# Patient Record
Sex: Female | Born: 1980
Health system: Southern US, Community
[De-identification: ages and names within clinical notes are randomized; demographics above are authoritative.]

## PROBLEM LIST (undated history)

## (undated) DIAGNOSIS — G43019 Migraine without aura, intractable, without status migrainosus: Secondary | ICD-10-CM

## (undated) DIAGNOSIS — E282 Polycystic ovarian syndrome: Secondary | ICD-10-CM

## (undated) DIAGNOSIS — J45909 Unspecified asthma, uncomplicated: Secondary | ICD-10-CM

## (undated) DIAGNOSIS — G932 Benign intracranial hypertension: Secondary | ICD-10-CM

## (undated) DIAGNOSIS — I10 Essential (primary) hypertension: Secondary | ICD-10-CM

## (undated) DIAGNOSIS — C801 Malignant (primary) neoplasm, unspecified: Secondary | ICD-10-CM

## (undated) DIAGNOSIS — R569 Unspecified convulsions: Secondary | ICD-10-CM

## (undated) DIAGNOSIS — G43909 Migraine, unspecified, not intractable, without status migrainosus: Secondary | ICD-10-CM

## (undated) HISTORY — PX: HERNIA REPAIR: SHX51

## (undated) HISTORY — PX: NASAL RECONSTRUCTION: SHX2069

## (undated) HISTORY — DX: Migraine without aura, intractable, without status migrainosus: G43.019

---
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
## 2003-10-20 ENCOUNTER — Ambulatory Visit: Payer: Self-pay | Admitting: Obstetrics and Gynecology

## 2003-12-09 ENCOUNTER — Emergency Department (HOSPITAL_COMMUNITY): Admission: EM | Admit: 2003-12-09 | Discharge: 2003-12-09 | Payer: Self-pay | Admitting: Emergency Medicine

## 2004-04-06 ENCOUNTER — Emergency Department (HOSPITAL_COMMUNITY): Admission: EM | Admit: 2004-04-06 | Discharge: 2004-04-06 | Payer: Self-pay | Admitting: Emergency Medicine

## 2004-10-19 ENCOUNTER — Other Ambulatory Visit: Admission: RE | Admit: 2004-10-19 | Discharge: 2004-10-19 | Payer: Self-pay | Admitting: Obstetrics and Gynecology

## 2005-05-30 ENCOUNTER — Emergency Department (HOSPITAL_COMMUNITY): Admission: EM | Admit: 2005-05-30 | Discharge: 2005-05-30 | Payer: Self-pay | Admitting: Emergency Medicine

## 2005-09-07 ENCOUNTER — Emergency Department (HOSPITAL_COMMUNITY): Admission: EM | Admit: 2005-09-07 | Discharge: 2005-09-07 | Payer: Self-pay | Admitting: Family Medicine

## 2005-12-18 ENCOUNTER — Other Ambulatory Visit: Admission: RE | Admit: 2005-12-18 | Discharge: 2005-12-18 | Payer: Self-pay | Admitting: Obstetrics and Gynecology

## 2006-01-01 ENCOUNTER — Emergency Department (HOSPITAL_COMMUNITY): Admission: EM | Admit: 2006-01-01 | Discharge: 2006-01-01 | Payer: Self-pay | Admitting: Emergency Medicine

## 2007-10-23 ENCOUNTER — Encounter (INDEPENDENT_AMBULATORY_CARE_PROVIDER_SITE_OTHER): Payer: Self-pay | Admitting: Obstetrics and Gynecology

## 2007-10-23 ENCOUNTER — Ambulatory Visit (HOSPITAL_COMMUNITY): Admission: RE | Admit: 2007-10-23 | Discharge: 2007-10-24 | Payer: Self-pay | Admitting: Obstetrics and Gynecology

## 2007-12-13 ENCOUNTER — Emergency Department (HOSPITAL_BASED_OUTPATIENT_CLINIC_OR_DEPARTMENT_OTHER): Admission: EM | Admit: 2007-12-13 | Discharge: 2007-12-13 | Payer: Self-pay | Admitting: Emergency Medicine

## 2007-12-21 ENCOUNTER — Emergency Department (HOSPITAL_COMMUNITY): Admission: EM | Admit: 2007-12-21 | Discharge: 2007-12-21 | Payer: Self-pay | Admitting: Emergency Medicine

## 2008-03-19 ENCOUNTER — Emergency Department (HOSPITAL_COMMUNITY): Admission: EM | Admit: 2008-03-19 | Discharge: 2008-03-19 | Payer: Self-pay | Admitting: Emergency Medicine

## 2008-06-12 ENCOUNTER — Emergency Department (HOSPITAL_BASED_OUTPATIENT_CLINIC_OR_DEPARTMENT_OTHER): Admission: EM | Admit: 2008-06-12 | Discharge: 2008-06-12 | Payer: Self-pay | Admitting: Emergency Medicine

## 2008-06-12 ENCOUNTER — Ambulatory Visit: Payer: Self-pay | Admitting: Diagnostic Radiology

## 2008-07-10 ENCOUNTER — Emergency Department (HOSPITAL_COMMUNITY): Admission: EM | Admit: 2008-07-10 | Discharge: 2008-07-10 | Payer: Self-pay | Admitting: Emergency Medicine

## 2008-09-29 ENCOUNTER — Emergency Department (HOSPITAL_COMMUNITY): Admission: EM | Admit: 2008-09-29 | Discharge: 2008-09-29 | Payer: Self-pay | Admitting: Emergency Medicine

## 2009-01-27 ENCOUNTER — Ambulatory Visit: Payer: Self-pay | Admitting: Diagnostic Radiology

## 2009-01-27 ENCOUNTER — Emergency Department (HOSPITAL_BASED_OUTPATIENT_CLINIC_OR_DEPARTMENT_OTHER): Admission: EM | Admit: 2009-01-27 | Discharge: 2009-01-27 | Payer: Self-pay | Admitting: Emergency Medicine

## 2009-03-27 ENCOUNTER — Emergency Department (HOSPITAL_BASED_OUTPATIENT_CLINIC_OR_DEPARTMENT_OTHER): Admission: EM | Admit: 2009-03-27 | Discharge: 2009-03-27 | Payer: Self-pay | Admitting: Emergency Medicine

## 2009-04-01 ENCOUNTER — Emergency Department (HOSPITAL_BASED_OUTPATIENT_CLINIC_OR_DEPARTMENT_OTHER): Admission: EM | Admit: 2009-04-01 | Discharge: 2009-04-01 | Payer: Self-pay | Admitting: Emergency Medicine

## 2009-05-07 ENCOUNTER — Emergency Department (HOSPITAL_COMMUNITY): Admission: EM | Admit: 2009-05-07 | Discharge: 2009-05-07 | Payer: Self-pay | Admitting: Family Medicine

## 2009-10-05 ENCOUNTER — Ambulatory Visit: Payer: Self-pay | Admitting: Diagnostic Radiology

## 2009-10-05 ENCOUNTER — Emergency Department (HOSPITAL_BASED_OUTPATIENT_CLINIC_OR_DEPARTMENT_OTHER): Admission: EM | Admit: 2009-10-05 | Discharge: 2009-10-05 | Payer: Self-pay | Admitting: Emergency Medicine

## 2010-02-12 ENCOUNTER — Inpatient Hospital Stay (HOSPITAL_COMMUNITY)
Admission: AD | Admit: 2010-02-12 | Discharge: 2010-02-14 | Payer: Self-pay | Source: Home / Self Care | Attending: Obstetrics | Admitting: Obstetrics

## 2010-02-21 LAB — URINALYSIS, ROUTINE W REFLEX MICROSCOPIC
Bilirubin Urine: NEGATIVE
Ketones, ur: NEGATIVE mg/dL
Leukocytes, UA: NEGATIVE
Nitrite: NEGATIVE
Protein, ur: NEGATIVE mg/dL
Specific Gravity, Urine: 1.015 (ref 1.005–1.030)
Urine Glucose, Fasting: NEGATIVE mg/dL
Urobilinogen, UA: 0.2 mg/dL (ref 0.0–1.0)
pH: 6 (ref 5.0–8.0)

## 2010-02-21 LAB — URINE MICROSCOPIC-ADD ON

## 2010-02-21 LAB — WET PREP, GENITAL: Yeast Wet Prep HPF POC: NONE SEEN

## 2010-02-21 LAB — GC/CHLAMYDIA PROBE AMP, GENITAL
Chlamydia, DNA Probe: NEGATIVE
GC Probe Amp, Genital: NEGATIVE

## 2010-04-22 LAB — CBC
HCT: 37.1 % (ref 36.0–46.0)
Hemoglobin: 13 g/dL (ref 12.0–15.0)
MCH: 32 pg (ref 26.0–34.0)
MCHC: 35 g/dL (ref 30.0–36.0)
MCV: 91.7 fL (ref 78.0–100.0)
Platelets: 264 10*3/uL (ref 150–400)
RBC: 4.05 MIL/uL (ref 3.87–5.11)
RDW: 12.7 % (ref 11.5–15.5)
WBC: 7.6 10*3/uL (ref 4.0–10.5)

## 2010-04-22 LAB — BASIC METABOLIC PANEL
BUN: 15 mg/dL (ref 6–23)
CO2: 26 mEq/L (ref 19–32)
Calcium: 9.1 mg/dL (ref 8.4–10.5)
Chloride: 106 mEq/L (ref 96–112)
Creatinine, Ser: 0.9 mg/dL (ref 0.4–1.2)
GFR calc Af Amer: >60 mL/min (ref >60)
GFR calc non Af Amer: >60 mL/min (ref >60)
Glucose, Bld: 90 mg/dL (ref 70–99)
Potassium: 4.1 mEq/L (ref 3.5–5.1)
Sodium: 139 mEq/L (ref 135–145)

## 2010-04-22 LAB — DIFFERENTIAL
Basophils Absolute: 0 10*3/uL (ref 0.0–0.1)
Basophils Relative: 0 % (ref 0–1)
Eosinophils Absolute: 0.2 10*3/uL (ref 0.0–0.7)
Eosinophils Relative: 2 % (ref 0–5)
Lymphocytes Relative: 22 % (ref 12–46)
Lymphs Abs: 1.7 10*3/uL (ref 0.7–4.0)
Monocytes Absolute: 0.6 10*3/uL (ref 0.1–1.0)
Monocytes Relative: 8 % (ref 3–12)
Neutro Abs: 5.1 10*3/uL (ref 1.7–7.7)
Neutrophils Relative %: 68 % (ref 43–77)

## 2010-04-22 LAB — URINALYSIS, ROUTINE W REFLEX MICROSCOPIC
Bilirubin Urine: NEGATIVE
Glucose, UA: NEGATIVE mg/dL
Hgb urine dipstick: NEGATIVE
Ketones, ur: 15 mg/dL — AB
Nitrite: NEGATIVE
Protein, ur: NEGATIVE mg/dL
Specific Gravity, Urine: 1.031 — ABNORMAL HIGH (ref 1.005–1.030)
Urobilinogen, UA: 1 mg/dL (ref 0.0–1.0)
pH: 7 (ref 5.0–8.0)

## 2010-04-22 LAB — RPR: RPR Ser Ql: NONREACTIVE

## 2010-04-22 LAB — HCG, QUANTITATIVE, PREGNANCY: hCG, Beta Chain, Quant, S: 13317 m[IU]/mL — ABNORMAL HIGH (ref <5)

## 2010-04-22 LAB — ABO/RH: ABO/RH(D): B POS

## 2010-04-22 LAB — PREGNANCY, URINE: Preg Test, Ur: POSITIVE

## 2010-04-22 LAB — GC/CHLAMYDIA PROBE AMP, GENITAL: Chlamydia, DNA Probe: NEGATIVE

## 2010-04-22 LAB — WET PREP, GENITAL

## 2010-04-27 LAB — DIFFERENTIAL
Lymphs Abs: 1.2 10*3/uL (ref 0.7–4.0)
Monocytes Relative: 8 % (ref 3–12)
Neutro Abs: 6.3 10*3/uL (ref 1.7–7.7)
Neutrophils Relative %: 75 % (ref 43–77)

## 2010-04-27 LAB — URINALYSIS, ROUTINE W REFLEX MICROSCOPIC
Bilirubin Urine: NEGATIVE
Nitrite: NEGATIVE
Specific Gravity, Urine: 1.025 (ref 1.005–1.030)
Urobilinogen, UA: 1 mg/dL (ref 0.0–1.0)

## 2010-04-27 LAB — GC/CHLAMYDIA PROBE AMP, GENITAL: Chlamydia, DNA Probe: NEGATIVE

## 2010-04-27 LAB — WET PREP, GENITAL: WBC, Wet Prep HPF POC: NONE SEEN

## 2010-04-27 LAB — CBC
MCV: 91.2 fL (ref 78.0–100.0)
RBC: 4.35 MIL/uL (ref 3.87–5.11)
WBC: 8.4 10*3/uL (ref 4.0–10.5)

## 2010-04-27 LAB — PREGNANCY, URINE: Preg Test, Ur: NEGATIVE

## 2010-05-09 LAB — CBC
MCHC: 34.2 g/dL (ref 30.0–36.0)
RBC: 4.26 MIL/uL (ref 3.87–5.11)
WBC: 4.3 10*3/uL (ref 4.0–10.5)

## 2010-05-09 LAB — DIFFERENTIAL
Basophils Relative: 1 % (ref 0–1)
Lymphs Abs: 1.3 10*3/uL (ref 0.7–4.0)
Monocytes Relative: 11 % (ref 3–12)
Neutro Abs: 2.2 10*3/uL (ref 1.7–7.7)
Neutrophils Relative %: 53 % (ref 43–77)

## 2010-05-09 LAB — BASIC METABOLIC PANEL
Calcium: 9.2 mg/dL (ref 8.4–10.5)
Creatinine, Ser: 0.9 mg/dL (ref 0.4–1.2)
GFR calc Af Amer: >60 mL/min (ref >60)

## 2010-05-09 LAB — GC/CHLAMYDIA PROBE AMP, GENITAL
Chlamydia, DNA Probe: NEGATIVE
GC Probe Amp, Genital: NEGATIVE

## 2010-05-09 LAB — URINALYSIS, ROUTINE W REFLEX MICROSCOPIC
Leukocytes, UA: NEGATIVE
Nitrite: NEGATIVE
Specific Gravity, Urine: 1.026 (ref 1.005–1.030)
pH: 7 (ref 5.0–8.0)

## 2010-05-09 LAB — WET PREP, GENITAL

## 2010-05-09 LAB — RPR: RPR Ser Ql: NONREACTIVE

## 2010-05-09 LAB — PREGNANCY, URINE: Preg Test, Ur: NEGATIVE

## 2010-05-09 LAB — URINE MICROSCOPIC-ADD ON

## 2010-05-11 ENCOUNTER — Inpatient Hospital Stay (HOSPITAL_COMMUNITY)
Admission: AD | Admit: 2010-05-11 | Discharge: 2010-05-11 | Disposition: A | Discharge status: Home / Self Care | Payer: Medicaid Other | Source: Ambulatory Visit | Attending: Obstetrics | Admitting: Obstetrics

## 2010-05-11 DIAGNOSIS — W010XXA Fall on same level from slipping, tripping and stumbling without subsequent striking against object, initial encounter: Secondary | ICD-10-CM | POA: Insufficient documentation

## 2010-05-11 DIAGNOSIS — O99891 Other specified diseases and conditions complicating pregnancy: Secondary | ICD-10-CM

## 2010-05-11 DIAGNOSIS — O9989 Other specified diseases and conditions complicating pregnancy, childbirth and the puerperium: Secondary | ICD-10-CM

## 2010-05-14 LAB — DIFFERENTIAL
Basophils Absolute: 0.1 10*3/uL (ref 0.0–0.1)
Basophils Relative: 1 % (ref 0–1)
Eosinophils Absolute: 0.1 10*3/uL (ref 0.0–0.7)
Eosinophils Relative: 2 % (ref 0–5)
Monocytes Absolute: 0.3 10*3/uL (ref 0.1–1.0)

## 2010-05-14 LAB — BASIC METABOLIC PANEL
BUN: 14 mg/dL (ref 6–23)
CO2: 28 mEq/L (ref 19–32)
Chloride: 108 mEq/L (ref 96–112)
GFR calc non Af Amer: >60 mL/min (ref >60)
Glucose, Bld: 99 mg/dL (ref 70–99)
Potassium: 4.2 mEq/L (ref 3.5–5.1)

## 2010-05-14 LAB — CBC
HCT: 36.8 % (ref 36.0–46.0)
MCHC: 33.6 g/dL (ref 30.0–36.0)
MCV: 91.6 fL (ref 78.0–100.0)
Platelets: 245 10*3/uL (ref 150–400)
RDW: 13.6 % (ref 11.5–15.5)

## 2010-05-14 LAB — D-DIMER, QUANTITATIVE: D-Dimer, Quant: 0.29 ug/mL-FEU (ref 0.00–0.48)

## 2010-05-17 LAB — URINE MICROSCOPIC-ADD ON

## 2010-05-17 LAB — COMPREHENSIVE METABOLIC PANEL
ALT: <6 U/L (ref 0–35)
AST: 20 U/L (ref 0–37)
Albumin: 4.5 g/dL (ref 3.5–5.2)
Alkaline Phosphatase: 59 U/L (ref 39–117)
Calcium: 9.4 mg/dL (ref 8.4–10.5)
GFR calc Af Amer: >60 mL/min (ref >60)
Glucose, Bld: 80 mg/dL (ref 70–99)
Potassium: 4.1 mEq/L (ref 3.5–5.1)
Sodium: 146 mEq/L — ABNORMAL HIGH (ref 135–145)
Total Protein: 8 g/dL (ref 6.0–8.3)

## 2010-05-17 LAB — DIFFERENTIAL
Basophils Relative: 1 % (ref 0–1)
Eosinophils Absolute: 0.1 10*3/uL (ref 0.0–0.7)
Lymphs Abs: 1.4 10*3/uL (ref 0.7–4.0)
Monocytes Absolute: 0.2 10*3/uL (ref 0.1–1.0)
Monocytes Relative: 5 % (ref 3–12)
Neutrophils Relative %: 63 % (ref 43–77)

## 2010-05-17 LAB — URINALYSIS, ROUTINE W REFLEX MICROSCOPIC
Glucose, UA: NEGATIVE mg/dL
Ketones, ur: NEGATIVE mg/dL
Leukocytes, UA: NEGATIVE
Nitrite: NEGATIVE
Specific Gravity, Urine: 1.024 (ref 1.005–1.030)
pH: 6 (ref 5.0–8.0)

## 2010-05-17 LAB — CBC
Hemoglobin: 14.5 g/dL (ref 12.0–15.0)
MCHC: 31.9 g/dL (ref 30.0–36.0)
Platelets: 253 10*3/uL (ref 150–400)
RDW: 13 % (ref 11.5–15.5)

## 2010-05-17 LAB — PREGNANCY, URINE: Preg Test, Ur: NEGATIVE

## 2010-05-20 ENCOUNTER — Inpatient Hospital Stay (HOSPITAL_COMMUNITY)
Admission: AD | Admit: 2010-05-20 | Discharge: 2010-05-20 | Disposition: A | Discharge status: Home / Self Care | Payer: Medicaid Other | Source: Ambulatory Visit | Attending: Obstetrics | Admitting: Obstetrics

## 2010-05-20 DIAGNOSIS — H60399 Other infective otitis externa, unspecified ear: Secondary | ICD-10-CM | POA: Insufficient documentation

## 2010-05-20 DIAGNOSIS — O99891 Other specified diseases and conditions complicating pregnancy: Secondary | ICD-10-CM | POA: Insufficient documentation

## 2010-05-20 DIAGNOSIS — A5901 Trichomonal vulvovaginitis: Secondary | ICD-10-CM

## 2010-05-20 DIAGNOSIS — O98819 Other maternal infectious and parasitic diseases complicating pregnancy, unspecified trimester: Secondary | ICD-10-CM

## 2010-05-20 DIAGNOSIS — O9989 Other specified diseases and conditions complicating pregnancy, childbirth and the puerperium: Secondary | ICD-10-CM

## 2010-05-20 LAB — URINE MICROSCOPIC-ADD ON

## 2010-05-20 LAB — URINALYSIS, ROUTINE W REFLEX MICROSCOPIC
Glucose, UA: NEGATIVE mg/dL
Hgb urine dipstick: NEGATIVE
Ketones, ur: NEGATIVE mg/dL
pH: 7.5 (ref 5.0–8.0)

## 2010-05-27 ENCOUNTER — Ambulatory Visit (HOSPITAL_COMMUNITY)
Admission: RE | Admit: 2010-05-27 | Discharge: 2010-05-27 | Disposition: A | Discharge status: Home / Self Care | Payer: Medicaid Other | Source: Ambulatory Visit | Attending: Obstetrics & Gynecology | Admitting: Obstetrics & Gynecology

## 2010-05-27 ENCOUNTER — Inpatient Hospital Stay (HOSPITAL_COMMUNITY)
Admission: AD | Admit: 2010-05-27 | Discharge: 2010-06-01 | DRG: 766 | Disposition: A | Discharge status: Home / Self Care | Payer: Medicaid Other | Source: Ambulatory Visit | Attending: Obstetrics | Admitting: Obstetrics

## 2010-05-27 DIAGNOSIS — O324XX0 Maternal care for high head at term, not applicable or unspecified: Secondary | ICD-10-CM | POA: Diagnosis present

## 2010-05-27 DIAGNOSIS — O139 Gestational [pregnancy-induced] hypertension without significant proteinuria, unspecified trimester: Principal | ICD-10-CM | POA: Diagnosis present

## 2010-05-27 LAB — CBC
HCT: 30.3 % — ABNORMAL LOW (ref 36.0–46.0)
MCH: 30.5 pg (ref 26.0–34.0)
MCHC: 33.3 g/dL (ref 30.0–36.0)
MCV: 91.5 fL (ref 78.0–100.0)
Platelets: 258 10*3/uL (ref 150–400)
RDW: 14.7 % (ref 11.5–15.5)

## 2010-05-27 LAB — URINALYSIS, ROUTINE W REFLEX MICROSCOPIC
Bilirubin Urine: NEGATIVE
Ketones, ur: NEGATIVE mg/dL
Nitrite: NEGATIVE
Specific Gravity, Urine: 1.01 (ref 1.005–1.030)
Urobilinogen, UA: 0.2 mg/dL (ref 0.0–1.0)

## 2010-05-27 LAB — COMPREHENSIVE METABOLIC PANEL
Alkaline Phosphatase: 216 U/L — ABNORMAL HIGH (ref 39–117)
BUN: 4 mg/dL — ABNORMAL LOW (ref 6–23)
Calcium: 9 mg/dL (ref 8.4–10.5)
Creatinine, Ser: 0.75 mg/dL (ref 0.4–1.2)
Glucose, Bld: 75 mg/dL (ref 70–99)
Total Protein: 5.9 g/dL — ABNORMAL LOW (ref 6.0–8.3)

## 2010-05-27 LAB — URIC ACID: Uric Acid, Serum: 5.4 mg/dL (ref 2.4–7.0)

## 2010-05-28 LAB — RPR: RPR Ser Ql: NONREACTIVE

## 2010-05-29 ENCOUNTER — Other Ambulatory Visit: Payer: Self-pay | Admitting: Obstetrics & Gynecology

## 2010-05-29 LAB — MRSA PCR SCREENING: MRSA by PCR: NEGATIVE

## 2010-05-29 LAB — CBC
Hemoglobin: 10.4 g/dL — ABNORMAL LOW (ref 12.0–15.0)
MCHC: 32.8 g/dL (ref 30.0–36.0)
RBC: 3.44 MIL/uL — ABNORMAL LOW (ref 3.87–5.11)
WBC: 12.3 10*3/uL — ABNORMAL HIGH (ref 4.0–10.5)

## 2010-05-29 LAB — MAGNESIUM: Magnesium: 4.5 mg/dL — ABNORMAL HIGH (ref 1.5–2.5)

## 2010-05-30 LAB — CBC
Hemoglobin: 9.3 g/dL — ABNORMAL LOW (ref 12.0–15.0)
MCHC: 33.5 g/dL (ref 30.0–36.0)
WBC: 19.1 10*3/uL — ABNORMAL HIGH (ref 4.0–10.5)

## 2010-05-30 LAB — COMPREHENSIVE METABOLIC PANEL
ALT: 18 U/L (ref 0–35)
AST: 27 U/L (ref 0–37)
CO2: 26 mEq/L (ref 19–32)
Calcium: 8.2 mg/dL — ABNORMAL LOW (ref 8.4–10.5)
GFR calc Af Amer: >60 mL/min (ref >60)
GFR calc non Af Amer: >60 mL/min (ref >60)
Sodium: 136 mEq/L (ref 135–145)
Total Protein: 4.8 g/dL — ABNORMAL LOW (ref 6.0–8.3)

## 2010-06-01 ENCOUNTER — Inpatient Hospital Stay (HOSPITAL_COMMUNITY): Payer: Medicaid Other

## 2010-06-07 NOTE — H&P (Signed)
  NAMEROSEANNE, JUENGER              ACCOUNT NO.:  [id number]  MEDICAL RECORD NO.:  [id number]         PATIENT TYPE:  WINP  LOCATION:  371                           [facility]ACILITY:  [facility]  PHYSICIAN:  Roseanna Rainbow, M.D.DATE OF BIRTH:  May 29, 1980  DATE OF ADMISSION:  05/28/2010 DATE OF DISCHARGE:                             HISTORY & PHYSICAL   CHIEF COMPLAINT:  The patient is a 30 year old para 0 with an intrauterine pregnancy at 39 plus weeks, complaining of a headache.  HISTORY OF PRESENT ILLNESS:  The patient carries a diagnosis of migraine headaches and was on a Triptans medication prior to pregnancy.  She gives a several-day history of a right-sided frontal headache that has been refractory to over-the-counter analgesics.  She did have associated scotomata.  She denies any other neurological symptoms.  This pregnancy has essentially been uncomplicated to date.  In the Maternity Admissions Unit, the patient was treated with Phenergan.  SOCIAL HISTORY:  She is single.  She denies any tobacco, ethanol, or drug use.  ALLERGIES:  To PENICILLIN and NONSTEROIDAL.  PAST GYN HISTORY:  Normal triad.  PAST MEDICAL HISTORY:  Please see the above.  There is a remote history of seizure disorder.  PAST SURGICAL HISTORY:  Herniorrhaphy and surgery to repair deviated nasal septum.  FAMILY HISTORY:  Noncontributory.  PRENATAL CARE:  Prenatal care with Dr. Gaynell Face, onset of care at 12 weeks.  PRENATAL LABS:  Hematocrit 37, hemoglobin 12.1, platelets 259,000. Blood type B positive, antibody screen negative, sickle cell negative, RPR nonreactive, rubella immune, hepatitis B surface antigen negative, HIV nonreactive.  Pap test negative.  GC and Chlamydia probe negative.  MEDICATIONS:  Please see the medication reconciliation form.  PHYSICAL EXAMINATION:  Blood pressures 110s-140s/60s-80s.  Fetal heart tracing baseline 140, moderate long-term variability.  Tocodynamometer, rare  uterine contractions.  Sterile vaginal exam per the RN, cervix is 1- cm dilated, 70% effaced.  LABS:  Hemoglobin 10.1, platelets 268,000.  Uric acid 5.4, creatinine 0.75.  OTs and PTs normal.  Urinalysis, negative protein.  ASSESSMENT:  Nullipara at term with pregnancy-induced hypertension, questionable neurological symptoms with severe headache that is now abating status post magnesium sulfate, category 1 fetal heart tracing, borderline favorable Bishop score.  PLAN:  Admission, induction of labor.  We will start with cervical ripening with Cervidil, magnesium sulfate for seizure prophylaxis.     Roseanna Rainbow, M.D.     Judee Clara  D:  05/28/2010  T:  05/28/2010  Job:  [id number]  Electronically Signed by Antionette Char M.D. on 06/07/2010 03:37:31 PM

## 2010-06-07 NOTE — Op Note (Signed)
[name], [name]              ACCOUNT NO.:  [id number]  MEDICAL RECORD NO.:  [id number]           PATIENT TYPE:  LOCATION:  371                            FACILITY:  PHYSICIAN:  Roseanna Rainbow, M.D.DATE OF BIRTH:  21-Apr-1980  DATE OF PROCEDURE:  05/29/2010 DATE OF DISCHARGE:                              OPERATIVE REPORT   PREOPERATIVE DIAGNOSES:  Intrauterine pregnancy at 39 weeks, severe pregnancy-induced hypertension, arrest of descent.  POSTOPERATIVE DIAGNOSES:  Intrauterine pregnancy at 39 weeks, severe pregnancy-induced hypertension, arrest of descent, persistent occiput posterior.  PROCEDURE:  Primary low uterine flap elliptical cesarean delivery via transverse incision.  SURGEON:  Roseanna Rainbow, MD  ANESTHESIA:  Epidural.  FINDINGS:  Live born female occiput posterior, Apgars 9 at 1 and 5 minutes respectively, umbilical vein pH 7.29, weight 6 pounds 12 ounces.  PATHOLOGY:  Placenta.  ESTIMATED BLOOD LOSS:  600 mL.  COMPLICATIONS:  Right lateral uterine incision extension.  URINE OUTPUT:  As per anesthesiology blood tinged.  IV FLUIDS:  As per Anesthesiology.  PROCEDURE:  The patient was taken to the operating room with an IV running and an epidural catheter in situ.  She was then placed in the dorsal supine position with a lateral tilt and prepped and draped in the usual sterile fashion.  After time-out had been completed a transverse incision was made with the scalpel.  This was extended down to the underlying fascia with the Bovie.  The fascia was nicked in the midline. The fascial incision was then extended bilaterally with curved Mayo scissors.  The superior aspect of the fascial incision was tented up and the underlying rectus muscles dissected off.  The inferior aspect of the fascial incision was manipulated in a similar fashion.  The rectus muscles were separated in the midline.  The parietal peritoneum was tented up and entered  sharply.  This incision was then extended superiorly and inferiorly with good visualization of the bladder.  The bladder blade was then placed.  The vesicouterine peritoneum was entered.  This incision was then extended bilaterally and a bladder flap created using blunt dissection.  The bladder blade was then replaced. The lower uterine segment was incised in a transverse fashion with a scalpel.  The incision was then extended bluntly.  The shoulders were then elevated from above.  There was assistance from below to elevate the infant's head.  The infant's head was wedged in the pelvis.  After several attempts, the suction was broken, and the head was delivered atraumatically.  The cord was clamped and cut.  The infant was handed off to the awaiting neonatologist.  An umbilical vein pH was sent.  The placenta was then removed and the intrauterine cavity was evacuated of any amniotic fluid clots and debris with a moistened laparotomy sponge. The above-noted right sided extension extending down towards the cervix for approximately 2 cm was repaired in a running interlocking fashion using 0 Monocryl.  A 2-cm extension was imbricated.  The remainder of the uterine incision was reapproximated in a running interlocking fashion using a suture of 0 Monocryl.  A second imbricating layer of the same suture  was then placed.  Adequate hemostasis was noted.  The paracolic gutters were then irrigated.  The parietal peritoneum was reapproximated in a running fashion using 2-0 Vicryl.  The fascia was closed with 2 running sutures of 0 Vicryl tied in the midline.  The skin was closed with staples.  At the close of the procedure, the instrument and pack counts were said to be correct x2.  The patient was taken to the PACU awake and in stable condition.     Roseanna Rainbow, M.D.     [name]  D:  05/29/2010  T:  05/29/2010  Job:  [id number]  Electronically Signed by Antionette Char M.D. on  06/07/2010 03:37:38 PM

## 2010-06-08 NOTE — Discharge Summary (Signed)
  NAMEJENIA, KLEPPER              ACCOUNT NO.:  [id number]  MEDICAL RECORD NO.:  [id number]           PATIENT TYPE:  I  LOCATION:  9124                          [facility]:  [facility]  PHYSICIAN:  Kathreen Cosier, M.D.DATE OF BIRTH:  Jun 06, 1980  DATE OF ADMISSION:  05/27/2010 DATE OF DISCHARGE:  06/01/2010                              DISCHARGE SUMMARY   The patient is a 30 year old primigravida, who was admitted at 39 weeks in labor.  The patient underwent a primary low-transverse cesarean section because of failure to progress.  She also had elevated blood pressure and was started on magnesium sulfate.  She had a female, Apgars 9 and 9 weighing 6 pounds 12 ounces.  Postoperatively, on day 1, her highest blood pressure was 147/87 and then 117-134/70.  Her output was good.  The magnesium was discontinued and she was discharged from the ICU.  There too, her hemoglobin was 10.4, highest blood pressure 130/94. She was started on labetalol 200 p.o. q.12 hours.  Third postoperative day, her abdomen was distended.  She had been able to pass gas or had a bowel movement and a flat plate and upright of her abdomen showed an ileus.  Shortly after the x-ray, she started passing gas and had bowel movements after an enema.  She was discharged on the evening of June 01, 2010, ambulatory on a regular diet on Tylox for pain and labetalol 200 mg p.o. b.i.d. to see me in 1 week for blood pressure check.  DISCHARGE DIAGNOSIS:  Status post primary low-transverse cesarean section at term for failure to progress.          ______________________________ Kathreen Cosier, M.D.     BAM/MEDQ  D:  06/01/2010  T:  06/02/2010  Job:  [id number]  Electronically Signed by Francoise Ceo M.D. on 06/08/2010 08:13:45 AM

## 2010-06-21 NOTE — Consult Note (Signed)
[name], [name]              ACCOUNT NO.:  [id number]   MEDICAL RECORD NO.:  [id number]          PATIENT TYPE:  AMB   LOCATION:  SDC                           [facility]  [facility]   PHYSICIAN:  Catherine A. Orlin Hilding, M.D.DATE OF BIRTH:  01/16/1981   DATE OF CONSULTATION:  10/23/2007  DATE OF DISCHARGE:                                 CONSULTATION   CHIEF COMPLAINT:  Seizure, postop from hysteroscopy and polypectomy for  menorrhagia.   HISTORY OF PRESENT ILLNESS:  Patient returned from [facility], hysteroscopy,  and polypectomy showing arching of back, which converted to tonic/clonic  movements per anesthesia and nursing staff, who witnessed this motion.  Patient then was brought to the recovery room, in which the patient  again had more seizure activity.  At that time, I had entered the room,  and the patient was having writhing motions of her lumbar spine with  some tremulous movements of her right arm and left arm along with  shivering activity.  The patient at this time had three seizures with  intermittent periods of time.  Dilantin 1 gm was started and infused  over 45 minutes.  Patient slowly became more arousable, as time  continued.  Patient was noticed to have tremulous motions and jerking  motions when tactile stimulation occurred and only when tactile  stimulation occurs at this time.  Patient soon became arousable to the  point in which she was able to obey commands and express to [place] that she  was cold and her left forearm was in significant pain.  The left arm  being in significant pain was due to the infusion of Dilantin.  Within  20 minutes, the patient was able to speak her name, tell where she was,  move all extremities, and show ocular movement.  While in both postop  and recovery, patient received 2 mg of Versed, for which she was induced  for surgery at 12:30.  Post surgery, she was given 2 mg of Versed, 0.2  mg of pentothal, 5 mg of Valium, 1 gm of Dilantin, Robinul,  and  ciprofloxacin.   PAST MEDICAL HISTORY:  1. Asthma.  2. Heart murmur.  3. Migraine headache.  4. Depression.  5. Anxiety.  6. HSV.  7. Menorrhagia.   HOME MEDICATIONS:  1. Acyclovir.  2. Lo-Estrin.  3. Tylenol.   As stated, prior to surgery, she was given 2 mg of Versed for induction.  While in surgery, Servoflurane and Robinul were used.  Post surgery,  patient was given 2 mg of Versed, 5 mg of Valium, 1 gm of Dilantin to  control seizure activity.   ALLERGIES:  1. ASPIRIN.  2. AMOXICILLIN.  3. MOTRIN.   FAMILY HISTORY:  Patient's mother has a family history of absence  seizures.   SOCIAL HISTORY:  Patient is a young 30 year old female who does not  smoke, drink, or do illicit drugs.   REVIEW OF SYSTEMS:  All 12 review of systems were questioned prior to  surgery.  Patient was positive for heart murmur, asthma, weakness,  menorrhagia, and menstrual cramping pain.   PHYSICAL EXAMINATION:  Patient had a blood pressure of 136/76, pulse 77,  respiratory rate 12.  MENTAL STATUS:  Patient was very sedated in a post-ictal state.  Patient  would open eyes to verbal stimuli.  She would whisper answers  accurately.  She would obey simple commands such as showing two fingers,  showing a thumb on the bilateral hands, showing the left hand and right  hand, moving left leg and right leg.  Pupils were equal, round, and reactive to light.  She had full EOMs.  No  tongue lacerations were noted.  No incontinence was noted.  Patient  vigorously responded to painful stimuli by withdrawal on all four  extremities.  Patient was laying in bed, not moving vigorously or  excessively at the time of exam.  Deep tendon reflexes throughout were  1+ with equivocal toes.  PULMONARY:  Clear to auscultation.  CARDIOVASCULAR:  Regular rate and rhythm.  No murmurs.  NECK:  Negative bruit.  Supple.  SENSATION: As stated, she would pull away from painful stimuli.   LABS:  CMET showed sodium  139, potassium 4, bicarb 108, chloride 27, BUN  12, creatinine 0.9.  Glucose 91.   No imaging was taken at this time.   ASSESSMENT:  A 30 year old status post hysterectomy and polypectomy with  seizure activity, most likely due to anesthetic drugs, such as Robinul,  sevoflurane.  Both Robinul and Servoflurane can cause seizures.  Exam  was nonfocal.  She is to continue with Dilantin bolus.  Hold off on the  maintenance Dilantin at this time.  Check ionized calcium, magnesium.  Check CT scan, possibly  MRI later.  Check EEG.  If further seizure,  consider Topamax, as this would be treatment for migraine and seizure  activity. Lamictal or Keppra are alternative choices.     ______________________________  Felicie Morn, PA-C      Gustavus Messing Orlin Hilding, M.D.  Electronically Signed    DS/MEDQ  D:  10/23/2007  T:  10/24/2007  Job:  [id number]

## 2010-06-21 NOTE — Procedures (Signed)
EEG NUMBER:  [id number].   HISTORY:  This is a 30 year old patient with a history of seizure event  following a procedure.  The patient has history of migraine, is being  evaluated for seizures.   Medications include valium, Zovirax, Dilantin, Tylenol, Zofran, Vicodin.   This is a portable EEG recording.  No skull defects were noted.   EEG CLASSIFICATION:  Normal awake and asleep.   DESCRIPTION OF RECORDING:  Background rhythm of this recording consists  of a fairly well-modulated medium amplitude alpha rhythm of 10 Hz that  is reactive to eye opening and closure.  As the record progresses, the  patient has brief periods during the recording of wakefulness with  bilaterally symmetric alpha rhythm seen.  The vast majority of the  recording is spent in the drowsy and sleeping state.  Vertex sharp wave  activity is noted along with rudimentary sleep spindles, K-complexes.  Photic stimulation and hyperventilation were not performed.  At no time  during the record did there appear to be evidence of spike or spike wave  discharges or evidence of focal slowing.  EKG monitor shows no evidence  of cardiac rhythm abnormalities; heart rate of 66.   IMPRESSION:  This is a normal EEG recording in the waking and sleeping  state.  No evidence of ictal or interictal discharges were seen.      Marlan Palau, M.D.  Electronically Signed     VHQ:IONG  D:  10/24/2007 18:01:38  T:  10/25/2007 02:17:21  Job #:  [id number]

## 2010-06-21 NOTE — Op Note (Signed)
[name], [name]              ACCOUNT NO.:  [id number]   MEDICAL RECORD NO.:  [id number]          PATIENT TYPE:  INP   LOCATION:  9371                          [facility]:  [facility]   PHYSICIAN:  Crist Fat. Rivard, M.D. DATE OF BIRTH:  [birth year]   DATE OF PROCEDURE:  10/23/2007  DATE OF DISCHARGE:                               OPERATIVE REPORT   PREOPERATIVE DIAGNOSIS:  Dysfunctional uterine bleeding with endometrial  polyp.   POSTOPERATIVE DIAGNOSIS:  Dysfunctional uterine bleeding with  endometrial polyp.   ANESTHESIA:  General by Dr. Tacy Dura.   PROCEDURE:  Hysteroscopy, resection of endometrial polyp, dilatation and  curettage.   SURGEON:  Crist Fat. Rivard, MD   ESTIMATED BLOOD LOSS:  Minimal.   WATER DEFICIT:  50 mL.   PROCEDURE:  After being informed of the planned procedure with possible  complications including bleeding, infection, and injury to uterus,  informed consent is obtained.  The patient was taken to OR #7, given  general anesthesia with laryngeal mask airway assistance.  She was  placed in the lithotomy position, prepped and draped in a sterile  fashion and her bladder was emptied with an in-and-out Foley catheter.  Pelvic exam reveals a retroverted uterus, normal in size and shape, and  two normal adnexa.  A weighted speculum was inserted in the vagina and  the anterior lip of the cervix was grasped with a tenaculum forceps.  Uterus was sounded at 7 cm and the cervix was easily dilated using Hegar  dilator until #33, which allows easy entry of an operative hysteroscope.  Using perfusion of sorbitol at a maximum pressure of 80 mmHg, we were  able to visualize the entire uterine cavity with both tubal ostia.  Near  the right tubal ostia at the fundus, a small polyp of 0.5 cm was  identified and using a single loop was resected and sent separately.  The rest of the endometrium appears completely normal.  Operative  hysteroscope was removed and a sharp curette  was used to curette the  endometrial cavity, which removes a moderate amount of normal-appearing  endometrium.  Once the curettage was completed, the hysteroscope was  reinserted for visualization of a normal cavity.  No active bleeding.  Hysteroscope was then removed.  Water deficit was evaluated at 50 mL.  The tenaculum forceps was removed and a laceration on the anterior lip  of the cervix due to the tenaculum forceps was repaired with the running  lock suture of 2-0 Vicryl.  Instrument and sponge count was complete x2.  Estimated blood loss was minimal.   Upon reversing general anesthesia and upon awakening the patient  suddenly presented with myoclonic movement compatible with a grand mal  seizure.  That episode lasted approximately a minute to a minute and 20  seconds and during that period of time maintained a good airway,  maintained good vital signs.  Anesthesia was called back in the room and  upon arrival, the patient had another short episode, which was  characterized as grand mal seizure of a short in duration possibly 30-40  seconds.  At that time,  she received some IV Versed and was transferred  to the PACU.  Upon arrival in PACU, third episode was seen and a  neurological consultation was called to Dr. Orlin Hilding at [facility].  Blood work was ordered with a complete metabolic panel, a  drug screen as well as a prolactin level.  A fourth episode was then  identified and Dr. Tacy Dura ordered some Dilantin to be given IV.  The  patient from PACU was transferred to CT scan for a head CT and will then  be monitored overnight in the Adult Intensive Care Unit under the care  of Dr. Orlin Hilding.   SPECIMEN:  Endometrial polyp and endometrial curettings sent to  pathology.  Operative report dictated by Dr. Estanislado Pandy on October 23, 2007.      Crist Fat Rivard, M.D.  Electronically Signed     SAR/MEDQ  D:  10/23/2007  T:  10/24/2007  Job:  [id number]   cc:   Santina Evans A.  Orlin Hilding, M.D.  Fax: [phone]

## 2010-06-24 NOTE — Discharge Summary (Signed)
[name], [name]              ACCOUNT NO.:  [id number]   MEDICAL RECORD NO.:  [id number]          PATIENT TYPE:  OIB   LOCATION:  9499                          [facility]  [facility]   PHYSICIAN:  Crist Fat. Rivard, M.D. DATE OF BIRTH:  12-Feb-1980   DATE OF ADMISSION:  10/23/2007  DATE OF DISCHARGE:  10/24/2007                               DISCHARGE SUMMARY   DISCHARGE DIAGNOSES:  Dysfunctional uterine bleeding, endometrial polyp,  postoperative seizure activity.   OPERATION:  On the date of admission, the patient underwent a  hysteroscopy, D&C with resection of an endometrial polyp, tolerating the  procedure well.   HISTORY OF PRESENT ILLNESS:  Ms. [name] is a 30 year old single black  female who presents for hysteroscopy, D&C with resection of an  endometrial polyp because of menorrhagia and a known endometrial polyp  based on sonohystogram in June 2009.  In spite of the patient having  been on oral contraceptives, her menorrhagia persisted.  Please see the  patient's history and physical examination for details.   PREOPERATIVE PHYSICAL EXAMINATION:  VITAL SIGNS:  Blood pressure 116/74.  HEENT:  Ear, nose and throat within normal limits.  NECK:  Normal.  CHEST:  Normal.  HEART:  Normal.  ABDOMEN:  Normal.  PELVIC:  Normal.   POSTOPERATIVE COURSE:  On the day of admission, the patient underwent  aforementioned procedure, tolerating it well.  Immediately  postoperatively, the patient experienced seizure activity and received a  neurologic consult.  It was discovered at that time that the patient had  previous seizure activity following anesthesia.  A family history of  seizure activity and it was believed that the patient's symptoms were  related to anesthesia medication.  Because of this, the patient was not  placed on any type of maintenance therapy.  However, she was scheduled  to follow up with a neurologist as an outpatient.  The patient's  comprehensive metabolic panel and  CBC were all within normal limits.  The patient had no further seizure activity during her hospital stay and  by postop day #1 had received the maximum benefit of her hospital visit  and therefore deemed ready for discharge home.   DISCHARGE MEDICATIONS:  The patient was directed to her home medication  reconciliation form.   DISCHARGE INSTRUCTIONS:  The patient was given the [facility] handout on hysteroscopy, D&C.   FOLLOWUP:  The patient is scheduled for a postoperative visit with Dr.  Dois Davenport Rivard in 2 weeks and she is to call [facility] at  [phone] to schedule that appointment.  Diet was without restriction.   FINAL PATHOLOGY:  Endometrium:  Benign proliferative endometrium, no  hyperplasia or malignancy identified.  Endometrium resection:  Fragments  of benign proliferative endometrium and a small amount myometrium.  No  hyperplasia or evidence of malignancy identified.      [place]mira J. Adline Peals.      Crist Fat Rivard, M.D.  Electronically Signed    EJP/MEDQ  D:  11/25/2007  T:  11/26/2007  Job:  [id number]

## 2010-06-24 NOTE — Group Therapy Note (Signed)
NAME:  [name], [name] NO.:  [id number]   MEDICAL RECORD NO.:  [id number]                   PATIENT TYPE:  WOC   LOCATION:  [facility]                           FACILITY:  [facility]   PHYSICIAN:  Argentina Donovan, MD                     DATE OF BIRTH:  1980-12-07   DATE OF SERVICE:  10/20/2003                                    CLINIC NOTE   CHIEF COMPLAINT:  Referred from [facility] for polycystic ovarian  syndrome.   HISTORY OF PRESENT ILLNESS:  This is a 30 year old single G0 who was  diagnosed with polycystic ovarian syndrome in 2002.  No medications  prescribed.  She was seen at [facility] for a routine Pap smear and  referred here for further evaluation and treatment.   PAST MEDICAL HISTORY:  1.  Asthma.  Last asthmatic attack was 3 years ago.  2.  Ruptured ovarian cysts necessitating three hospitalizations.  No      surgeries needed.   MEDICATIONS:  Depo-Provera 150 mg every 3 months.   ALLERGIES:  1.  AMOXICILLIN.  2.  ASPIRIN.  3.  MOTRIN.   SURGICAL HISTORY:  1.  Inguinal hernia repair, bilateral, when the patient 30 years old.  2.  Repair of deviated septum in 2001.   FAMILY HISTORY:  Diabetes in grandparents, heart disease in father,  hypertension in mother and maternal grandmother, leukemia in maternal  grandmother.   MENSTRUAL HISTORY:  Menarche at 30 years old.  Irregular menstrual periods  lasting 5 days with mild dysmenorrhea and intermenstrual bleeding.  Last  normal menstrual period August 30 through October 13, 2003.   OBSTETRICAL HISTORY:  G0.   PERSONAL AND SOCIAL HISTORY:  She is single, lives alone, works at [employer],  and is a Physicist, medical at [employer], studies sports Electrical engineer.  Occasional alcohol and nicotine use.  No history of sexually-  transmitted diseases.   REVIEW OF SYSTEMS:  Occasional fatigue, headache, and muscle aches.   OBJECTIVE:  VITAL SIGNS:  Blood pressure 125/74, pulse  rate is 68,  temperature of 98.7.  GENERAL:  This is a pleasant African-American female, not in distress.  HEENT:  There are features of hirsutism on the chin and the neck.  CARDIOVASCULAR:  Regular rate and rhythm, good S1 and S2, no murmurs.  LUNGS:  Clear to auscultation bilaterally, no crackles.  ABDOMEN:  Positive hirsutism at the lower abdominal area.  No masses or  tenderness.  Normal bowel sounds.  PELVIC:  Deferred.   ASSESSMENT:  Polycystic ovarian syndrome.   PLAN:  1.  Continue Depo-Provera 150 mg IM every 3 months.  2.  Yearly Pap smear.  3.  Follow-up care at [facility]  Argentina Donovan, MD    PR/MEDQ  D:  10/20/2003  T:  10/20/2003  Job:  [phone]

## 2010-11-07 LAB — CBC
HCT: 40
MCV: 90.8
Platelets: 260
RDW: 14.5

## 2010-11-07 LAB — COMPREHENSIVE METABOLIC PANEL
ALT: 12
AST: 18
Albumin: 3.6
CO2: 27
Calcium: 8.7
Creatinine, Ser: 0.9
GFR calc Af Amer: >60
Sodium: 139

## 2010-11-07 LAB — MAGNESIUM: Magnesium: 2

## 2010-11-08 LAB — POCT I-STAT, CHEM 8
BUN: 14
Chloride: 109
HCT: 39
Potassium: 3.4 — ABNORMAL LOW
Sodium: 143

## 2010-11-08 LAB — POCT CARDIAC MARKERS
CKMB, poc: 2.1
Myoglobin, poc: 270
Troponin i, poc: <0.05

## 2010-11-08 LAB — D-DIMER, QUANTITATIVE: D-Dimer, Quant: 0.39

## 2011-03-18 ENCOUNTER — Encounter (HOSPITAL_COMMUNITY): Payer: Self-pay

## 2011-03-18 ENCOUNTER — Emergency Department (HOSPITAL_COMMUNITY)
Admission: EM | Admit: 2011-03-18 | Discharge: 2011-03-18 | Disposition: A | Discharge status: Home / Self Care | Payer: Self-pay | Source: Home / Self Care | Attending: Family Medicine | Admitting: Family Medicine

## 2011-03-18 DIAGNOSIS — IMO0001 Reserved for inherently not codable concepts without codable children: Secondary | ICD-10-CM

## 2011-03-18 DIAGNOSIS — S6390XA Sprain of unspecified part of unspecified wrist and hand, initial encounter: Secondary | ICD-10-CM

## 2011-03-18 MED ORDER — TRAMADOL HCL 50 MG PO TABS: 50.0000 mg | ORAL_TABLET | Freq: Four times a day (QID) | ORAL | Status: AC | PRN

## 2011-03-18 NOTE — ED Notes (Signed)
Pt has had rt hand pain for two weeks, pain is with movement of rt middle finger.

## 2011-03-18 NOTE — ED Provider Notes (Signed)
History     CSN: [id number]  Arrival date & time 03/18/11  0920   First MD Initiated Contact with Patient 03/18/11 1021      Chief Complaint  Patient presents with  . Hand Pain    (Consider location/radiation/quality/duration/timing/severity/associated sxs/prior treatment) Patient is a 31 y.o. female presenting with hand pain. The history is provided by the patient.  Hand Pain This is a new problem. The current episode started more than 1 week ago (2 weeks ago at work but unknown activity, lifting her baby a lot at home, ). The problem has been gradually worsening. Exacerbated by: worse with gripping. She has tried acetaminophen for the symptoms. The treatment provided no relief.    History reviewed. No pertinent past medical history.  Past Surgical History  Procedure Date  . Hernia repair   . Cesarean section     History reviewed. No pertinent family history.  History  Substance Use Topics  . Smoking status: Never Smoker   . Smokeless tobacco: Not on file  . Alcohol Use: Yes    OB History    Grav Para Term Preterm Abortions TAB SAB Ect Mult Living                  Review of Systems  Constitutional: Negative.   Musculoskeletal: Negative for joint swelling.    Allergies  Amoxicillin; Aspirin; and Motrin  Home Medications   Current Outpatient Rx  Name Route Sig Dispense Refill  . PRESCRIPTION MEDICATION  Unknown birth control    . TRAMADOL HCL 50 MG PO TABS Oral Take 1 tablet (50 mg total) by mouth every 6 (six) hours as needed for pain. 15 tablet 0    BP 134/92  Pulse 75  Temp(Src) 98 F (36.7 C) (Oral)  Resp 20  LMP 02/28/2011  Physical Exam  Nursing note and vitals reviewed. Constitutional: She is oriented to person, place, and time. She appears well-developed and well-nourished.  Musculoskeletal: She exhibits tenderness.       Arms: Neurological: She is alert and oriented to person, place, and time.  Skin: Skin is warm and dry.    ED Course    Procedures (including critical care time)  Labs Reviewed - No data to display No results found.   1. Sprain of hand, third finger, right       MDM          Barkley Bruns, MD 03/20/11 1556

## 2011-08-31 ENCOUNTER — Encounter (HOSPITAL_COMMUNITY): Payer: Self-pay | Admitting: *Deleted

## 2011-08-31 ENCOUNTER — Emergency Department (HOSPITAL_COMMUNITY)
Admission: EM | Admit: 2011-08-31 | Discharge: 2011-08-31 | Disposition: A | Discharge status: Home / Self Care | Payer: Self-pay | Attending: Emergency Medicine | Admitting: Emergency Medicine

## 2011-08-31 DIAGNOSIS — R51 Headache: Secondary | ICD-10-CM

## 2011-08-31 HISTORY — DX: Polycystic ovarian syndrome: E28.2

## 2011-08-31 HISTORY — DX: Migraine, unspecified, not intractable, without status migrainosus: G43.909

## 2011-08-31 HISTORY — DX: Unspecified convulsions: R56.9

## 2011-08-31 MED ORDER — SODIUM CHLORIDE 0.9 % IV BOLUS (SEPSIS)
500.0000 mL | Freq: Once | INTRAVENOUS | Status: AC
  Administered 2011-08-31: 500 mL via INTRAVENOUS

## 2011-08-31 MED ORDER — PROMETHAZINE HCL 25 MG/ML IJ SOLN
25.0000 mg | Freq: Once | INTRAMUSCULAR | Status: AC
  Administered 2011-08-31: 25 mg via INTRAVENOUS
  Filled 2011-08-31: qty 1

## 2011-08-31 MED ORDER — DIPHENHYDRAMINE HCL 50 MG/ML IJ SOLN
25.0000 mg | Freq: Once | INTRAMUSCULAR | Status: AC
  Administered 2011-08-31: 25 mg via INTRAVENOUS
  Filled 2011-08-31: qty 1

## 2011-08-31 NOTE — ED Notes (Signed)
Pt with hx of migraines to ED c/o migraine since 2230 last night.  Pt nauseated with emesis x 1 and photophobic.  Last migraine was 05/2008 when she was pregnant.

## 2011-08-31 NOTE — ED Provider Notes (Signed)
History     CSN: [id number]  Arrival date & time 08/31/11  4696   First MD Initiated Contact with Patient 08/31/11 [phone]      Chief Complaint  Patient presents with  . Migraine    (Consider location/radiation/quality/duration/timing/severity/associated sxs/prior treatment) Patient is a 31 y.o. female presenting with migraine. The history is provided by the patient.  Migraine This is a recurrent problem. Associated symptoms include headaches. Pertinent negatives include no chest pain, no abdominal pain and no shortness of breath.   patient has a previous history of migraines. She states that around 10:00 last night she developed her typical headache. It is different in that she has some nauseousness and vomiting. The headache is a throbbing pain. She has photophobia. No fevers. No trauma. No numbness or weakness. Her last headache was  when she was pregnant in 2010.  Past Medical History  Diagnosis Date  . Migraines   . PCOS (polycystic ovarian syndrome)   . Seizures     c/b anesthesia    Past Surgical History  Procedure Date  . Hernia repair   . Cesarean section   . Nasal reconstruction     No family history on file.  History  Substance Use Topics  . Smoking status: Never Smoker   . Smokeless tobacco: Not on file  . Alcohol Use: Yes    OB History    Grav Para Term Preterm Abortions TAB SAB Ect Mult Living                  Review of Systems  Constitutional: Negative for activity change and appetite change.  HENT: Negative for neck stiffness.   Eyes: Positive for photophobia. Negative for pain.  Respiratory: Negative for chest tightness and shortness of breath.   Cardiovascular: Negative for chest pain and leg swelling.  Gastrointestinal: Positive for nausea and vomiting. Negative for abdominal pain and diarrhea.  Genitourinary: Negative for flank pain.  Musculoskeletal: Negative for back pain.  Skin: Negative for rash.  Neurological: Positive for headaches.  Negative for weakness and numbness.  Psychiatric/Behavioral: Negative for behavioral problems.    Allergies  Amoxicillin; Aspirin; and Motrin  Home Medications   Current Outpatient Rx  Name Route Sig Dispense Refill  . PRESCRIPTION MEDICATION  Unknown birth control      BP 144/101  Pulse 84  Temp 98.3 F (36.8 C) (Oral)  Resp 16  Ht 5\' 6"  (1.676 m)  Wt 161 lb (73.029 kg)  BMI 25.99 kg/m2  SpO2 100%  Physical Exam  Constitutional: She is oriented to person, place, and time. She appears well-developed and well-nourished.       Patient is sitting in bed with her head covered by a pillow.  HENT:  Head: Normocephalic.  Eyes: Pupils are equal, round, and reactive to light.  Cardiovascular: Normal rate and regular rhythm.   Pulmonary/Chest: Effort normal and breath sounds normal.  Musculoskeletal: She exhibits no edema and no tenderness.  Neurological: She is alert and oriented to person, place, and time.  Skin: Skin is warm and dry.    ED Course  Procedures (including critical care time)  Labs Reviewed - No data to display No results found.   1. Headache       MDM  Patient with recurrent migraine. Patient denies possibility of pregnancy. Patient feels better after treatment. She is nonfocal exam be discharged home.        Juliet Rude. Rubin Payor, MD 08/31/11 [phone]

## 2011-09-08 ENCOUNTER — Emergency Department (HOSPITAL_COMMUNITY): Payer: No Typology Code available for payment source

## 2011-09-08 ENCOUNTER — Encounter (HOSPITAL_COMMUNITY): Payer: Self-pay | Admitting: Emergency Medicine

## 2011-09-08 ENCOUNTER — Emergency Department (HOSPITAL_COMMUNITY)
Admission: EM | Admit: 2011-09-08 | Discharge: 2011-09-08 | Disposition: A | Discharge status: Home / Self Care | Payer: No Typology Code available for payment source | Attending: Emergency Medicine | Admitting: Emergency Medicine

## 2011-09-08 DIAGNOSIS — R51 Headache: Secondary | ICD-10-CM | POA: Insufficient documentation

## 2011-09-08 DIAGNOSIS — R404 Transient alteration of awareness: Secondary | ICD-10-CM | POA: Insufficient documentation

## 2011-09-08 DIAGNOSIS — M542 Cervicalgia: Secondary | ICD-10-CM

## 2011-09-08 DIAGNOSIS — M545 Low back pain, unspecified: Secondary | ICD-10-CM | POA: Insufficient documentation

## 2011-09-08 MED ORDER — ONDANSETRON HCL 4 MG/2ML IJ SOLN
4.0000 mg | Freq: Four times a day (QID) | INTRAMUSCULAR | Status: DC | PRN
  Administered 2011-09-08: 4 mg via INTRAVENOUS
  Filled 2011-09-08: qty 2

## 2011-09-08 MED ORDER — MORPHINE SULFATE 4 MG/ML IJ SOLN
4.0000 mg | Freq: Once | INTRAMUSCULAR | Status: AC
  Administered 2011-09-08: 4 mg via INTRAVENOUS
  Filled 2011-09-08: qty 1

## 2011-09-08 MED ORDER — SODIUM CHLORIDE 0.9 % IV SOLN
Freq: Once | INTRAVENOUS | Status: AC
  Administered 2011-09-08: 19:00:00 via INTRAVENOUS

## 2011-09-08 MED ORDER — OXYCODONE-ACETAMINOPHEN 5-325 MG PO TABS: ORAL_TABLET | ORAL | Status: AC
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
## 2011-09-08 NOTE — ED Provider Notes (Signed)
Medical screening examination/treatment/procedure(s) were performed by non-physician practitioner and as supervising physician I was immediately available for consultation/collaboration.  Ethelda Chick, MD 09/08/11 2024
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
## 2012-04-25 ENCOUNTER — Encounter (HOSPITAL_COMMUNITY): Payer: Self-pay | Admitting: *Deleted

## 2012-04-25 ENCOUNTER — Emergency Department (HOSPITAL_COMMUNITY)
Admission: EM | Admit: 2012-04-25 | Discharge: 2012-04-25 | Disposition: A | Discharge status: Home / Self Care | Payer: BC Managed Care – PPO | Attending: Emergency Medicine | Admitting: Emergency Medicine

## 2012-04-25 DIAGNOSIS — I10 Essential (primary) hypertension: Secondary | ICD-10-CM | POA: Insufficient documentation

## 2012-04-25 DIAGNOSIS — Z8742 Personal history of other diseases of the female genital tract: Secondary | ICD-10-CM | POA: Insufficient documentation

## 2012-04-25 DIAGNOSIS — Z8669 Personal history of other diseases of the nervous system and sense organs: Secondary | ICD-10-CM | POA: Insufficient documentation

## 2012-04-25 DIAGNOSIS — R51 Headache: Secondary | ICD-10-CM | POA: Insufficient documentation

## 2012-04-25 DIAGNOSIS — Z79899 Other long term (current) drug therapy: Secondary | ICD-10-CM | POA: Insufficient documentation

## 2012-04-25 HISTORY — DX: Essential (primary) hypertension: I10

## 2012-04-25 LAB — POCT PREGNANCY, URINE: Preg Test, Ur: NEGATIVE

## 2012-04-25 MED ORDER — METOCLOPRAMIDE HCL 5 MG/ML IJ SOLN
10.0000 mg | Freq: Once | INTRAMUSCULAR | Status: AC
  Administered 2012-04-25: 10 mg via INTRAVENOUS
  Filled 2012-04-25: qty 2

## 2012-04-25 MED ORDER — SODIUM CHLORIDE 0.9 % IV BOLUS (SEPSIS)
1000.0000 mL | Freq: Once | INTRAVENOUS | Status: AC
  Administered 2012-04-25: 1000 mL via INTRAVENOUS

## 2012-04-25 MED ORDER — DEXAMETHASONE SODIUM PHOSPHATE 10 MG/ML IJ SOLN
10.0000 mg | Freq: Once | INTRAMUSCULAR | Status: AC
  Administered 2012-04-25: 10 mg via INTRAVENOUS
  Filled 2012-04-25: qty 1

## 2012-04-25 NOTE — ED Provider Notes (Signed)
History     CSN: [id number]  Arrival date & time 04/25/12  1327   First MD Initiated Contact with Patient 04/25/12 1752      Chief Complaint  Patient presents with  . Migraine    (Consider location/radiation/quality/duration/timing/severity/associated sxs/prior treatment) Patient is a 32 y.o. female presenting with migraines.  Migraine   Pt with history of migraines reports 3 days of moderate to severe throbbing diffuse headache associated with nausea but no photophobia. This is different from previous migraine headaches she has had. She denies any vomiting or blurry vision, not sudden in onset. Not improved with hydrocodone taken at home.  Past Medical History  Diagnosis Date  . Migraines   . PCOS (polycystic ovarian syndrome)   . Seizures     c/b anesthesia  . Hypertension     Past Surgical History  Procedure Laterality Date  . Hernia repair    . Cesarean section    . Nasal reconstruction      No family history on file.  History  Substance Use Topics  . Smoking status: Never Smoker   . Smokeless tobacco: Not on file  . Alcohol Use: Yes     Comment: occasionally    OB History   Grav Para Term Preterm Abortions TAB SAB Ect Mult Living                  Review of Systems All other systems reviewed and are negative except as noted in HPI.   Allergies  Amoxicillin; Aspirin; and Motrin  Home Medications   Current Outpatient Rx  Name  Route  Sig  Dispense  Refill  . atenolol (TENORMIN) 25 MG tablet   Oral   Take 25 mg by mouth daily.         . hydrochlorothiazide (HYDRODIURIL) 25 MG tablet   Oral   Take 25 mg by mouth every morning.         Lorita Officer Triphasic (ORTHO TRI-CYCLEN, 28, PO)   Oral   Take 1 tablet by mouth daily.           BP 140/75  Pulse 86  Temp(Src) 98.5 F (36.9 C) (Oral)  Resp 18  SpO2 98%  LMP 04/22/2012  Physical Exam  Nursing note and vitals reviewed. Constitutional: She is oriented to person,  place, and time. She appears well-developed and well-nourished.  HENT:  Head: Normocephalic and atraumatic.  Eyes: EOM are normal. Pupils are equal, round, and reactive to light.  Neck: Normal range of motion. Neck supple.  Cardiovascular: Normal rate, normal heart sounds and intact distal pulses.   Pulmonary/Chest: Effort normal and breath sounds normal.  Abdominal: Bowel sounds are normal. She exhibits no distension. There is no tenderness.  Musculoskeletal: Normal range of motion. She exhibits no edema and no tenderness.  Neurological: She is alert and oriented to person, place, and time. She has normal strength. No cranial nerve deficit or sensory deficit.  Skin: Skin is warm and dry. No rash noted.  Psychiatric: She has a normal mood and affect.    ED Course  Procedures (including critical care time)  Labs Reviewed  POCT PREGNANCY, URINE   No results found.   1. Headache       MDM  Pt reports resolution of headache and she is ready go to home. No red flags for severe cause of symptoms. Doubt SAH, mass or meningitis.        Nakiah Osgood B. Bernette Mayers, MD 04/25/12 2019

## 2012-04-25 NOTE — ED Notes (Signed)
Pt states h/a is all throughout head and pain increases if she coughs or sneezes. No neuro deficits.

## 2012-04-25 NOTE — ED Notes (Signed)
Pt to ED c/o headache x 3 days.  She has a hx of migraines, but states this one feels different b/c she is not photophobic, but only nauseated.  Pain is "all throughout" her head.

## 2012-06-24 ENCOUNTER — Other Ambulatory Visit: Payer: Self-pay | Admitting: *Deleted

## 2012-06-24 DIAGNOSIS — R2231 Localized swelling, mass and lump, right upper limb: Secondary | ICD-10-CM

## 2012-06-25 ENCOUNTER — Ambulatory Visit (HOSPITAL_COMMUNITY)
Admission: RE | Admit: 2012-06-25 | Discharge: 2012-06-25 | Disposition: A | Discharge status: Home / Self Care | Payer: Self-pay | Source: Ambulatory Visit | Attending: Obstetrics and Gynecology | Admitting: Obstetrics and Gynecology

## 2012-06-25 ENCOUNTER — Encounter (HOSPITAL_COMMUNITY): Payer: Self-pay

## 2012-06-25 VITALS — BP 138/76 | Temp 98.3°F | Ht 66.5 in | Wt 162.4 lb

## 2012-06-25 DIAGNOSIS — Z01419 Encounter for gynecological examination (general) (routine) without abnormal findings: Secondary | ICD-10-CM

## 2012-06-25 DIAGNOSIS — R2230 Localized swelling, mass and lump, unspecified upper limb: Secondary | ICD-10-CM | POA: Insufficient documentation

## 2012-06-25 NOTE — Progress Notes (Signed)
Complaints of right axillary lump x 1.5 weeks.  Pap Smear:  Completed Pap smear today. Last Pap smear was March 2013 and normal per patient. Per patient has a history of two abnormal Pap smears that required repeat Pap smears. No Pap smear results in EPIC.  Physical exam: Breasts Breasts symmetrical. No skin abnormalities bilateral breasts. No nipple retraction bilateral breasts. No nipple discharge bilateral breasts. No lymphadenopathy. No lumps palpated left breast. Palpated large right axillary lump. No complaints of pain or tenderness on exam. Referred patient to the [facility] for diagnostic mammogram and possible right breast ultrasound. Appointment scheduled for Friday, Jul 05, 2012 at 1040.          Pelvic/Bimanual   Ext Genitalia Small herpes blister observed on right labia, no swelling and no discharge observed on external genitalia. Per patient has a history of genital herpes.    Vagina Vagina pink and normal texture. No lesions or discharge observed in vagina.          Cervix Cervix is present. Cervix pink and of normal texture. Small amount of brownish like discharge from cervical opening.          Uterus Uterus is present and palpable. Uterus in retroverted and normal size.       Adnexae Bilateral ovaries present and palpable. No tenderness on palpation.        Rectovaginal No rectal exam completed today since patient had no rectal complaints. No skin abnormalities observed on rectal area.

## 2012-06-25 NOTE — Patient Instructions (Addendum)
Taught patient how to perform BSE. Let patient know that her next Pap smear will be due in 1 year unless she has an abnormal Pap smear that needs one sooner due to her history of herpes. Referred patient to the [facility] for diagnostic mammogram and possible right breast ultrasound. Appointment scheduled for Friday, Jul 05, 2012 at 1040. Patient aware of appointment and will be there. Let patient know will follow up with her within the next couple weeks with results by phone. Patient verbalized understanding.

## 2012-07-02 ENCOUNTER — Telehealth (HOSPITAL_COMMUNITY): Payer: Self-pay | Admitting: *Deleted

## 2012-07-02 NOTE — Telephone Encounter (Signed)
Telephoned patient at home # and left message to return call to [facility] 

## 2012-07-02 NOTE — Telephone Encounter (Signed)
Patient returned call and advised of normal pap smear results. Next pap due in 3 years. [facility]t for Herpes outbreak. Phone number given to patient. Patient voiced understanding.

## 2012-07-05 ENCOUNTER — Ambulatory Visit
Admission: RE | Admit: 2012-07-05 | Discharge: 2012-07-05 | Disposition: A | Discharge status: Home / Self Care | Payer: No Typology Code available for payment source | Source: Ambulatory Visit | Attending: Obstetrics and Gynecology | Admitting: Obstetrics and Gynecology

## 2012-07-05 DIAGNOSIS — R2231 Localized swelling, mass and lump, right upper limb: Secondary | ICD-10-CM

## 2012-10-30 ENCOUNTER — Other Ambulatory Visit (HOSPITAL_COMMUNITY): Payer: Self-pay | Admitting: Obstetrics

## 2012-10-30 DIAGNOSIS — N83209 Unspecified ovarian cyst, unspecified side: Secondary | ICD-10-CM

## 2012-11-04 ENCOUNTER — Ambulatory Visit (HOSPITAL_COMMUNITY)
Admission: RE | Admit: 2012-11-04 | Discharge: 2012-11-04 | Disposition: A | Discharge status: Home / Self Care | Payer: BC Managed Care – PPO | Source: Ambulatory Visit | Attending: Obstetrics | Admitting: Obstetrics

## 2012-11-04 DIAGNOSIS — N949 Unspecified condition associated with female genital organs and menstrual cycle: Secondary | ICD-10-CM | POA: Insufficient documentation

## 2012-11-04 DIAGNOSIS — N83209 Unspecified ovarian cyst, unspecified side: Secondary | ICD-10-CM

## 2013-01-27 ENCOUNTER — Emergency Department (HOSPITAL_COMMUNITY)
Admission: EM | Admit: 2013-01-27 | Discharge: 2013-01-27 | Disposition: A | Discharge status: Home / Self Care | Payer: BC Managed Care – PPO | Source: Home / Self Care | Attending: Emergency Medicine | Admitting: Emergency Medicine

## 2013-01-27 ENCOUNTER — Encounter (HOSPITAL_COMMUNITY): Payer: Self-pay | Admitting: Emergency Medicine

## 2013-01-27 ENCOUNTER — Emergency Department (INDEPENDENT_AMBULATORY_CARE_PROVIDER_SITE_OTHER): Payer: BC Managed Care – PPO

## 2013-01-27 DIAGNOSIS — J111 Influenza due to unidentified influenza virus with other respiratory manifestations: Secondary | ICD-10-CM

## 2013-01-27 LAB — POCT RAPID STREP A: Streptococcus, Group A Screen (Direct): NEGATIVE

## 2013-01-27 MED ORDER — PREDNISONE 20 MG PO TABS: 20.0000 mg | ORAL_TABLET | Freq: Two times a day (BID) | ORAL | Status: DC

## 2013-01-27 MED ORDER — ACETAMINOPHEN 325 MG PO TABS
ORAL_TABLET | ORAL | Status: AC
  Filled 2013-01-27: qty 2

## 2013-01-27 MED ORDER — HYDROCOD POLST-CHLORPHEN POLST 10-8 MG/5ML PO LQCR: 5.0000 mL | Freq: Two times a day (BID) | ORAL | Status: DC | PRN

## 2013-01-27 MED ORDER — ACETAMINOPHEN 325 MG PO TABS
650.0000 mg | ORAL_TABLET | Freq: Once | ORAL | Status: AC
  Administered 2013-01-27: 650 mg via ORAL

## 2013-01-27 MED ORDER — TRAMADOL HCL 50 MG PO TABS: 100.0000 mg | ORAL_TABLET | Freq: Three times a day (TID) | ORAL | Status: DC | PRN

## 2013-01-27 NOTE — ED Notes (Signed)
Pt  Has  Symptoms  Of a  Non  Productive  Cough     With a  sorethroat  /  Headache    With  The  Symptoms  X  5  Days          She  Reports  Her  Voice  Is  Hoarse      And  The   Symptoms  Are  Not  releived  By otc  meds

## 2013-01-27 NOTE — ED Provider Notes (Signed)
Chief Complaint:   Chief Complaint  Patient presents with  . Cough    History of Present Illness:   [name] is a 32 year old female who has had a five-day history of sore throat, hoarseness, cough productive yellow sputum with blood, chest tightness, chest pain, headache, either of up to 102, chills, sweats, and myalgias. She denies any GI symptoms or nasal congestion. She's had no known sick exposures.  Review of Systems:  Other than noted above, the patient denies any of the following symptoms: Systemic:  No fevers, chills, sweats, weight loss or gain, fatigue, or tiredness. Eye:  No redness or discharge. ENT:  No ear pain, drainage, headache, nasal congestion, drainage, sinus pressure, difficulty swallowing, or sore throat. Neck:  No neck pain or swollen glands. Lungs:  No cough, sputum production, hemoptysis, wheezing, chest tightness, shortness of breath or chest pain. GI:  No abdominal pain, nausea, vomiting or diarrhea.  PMFSH:  Past medical history, family history, social history, meds, and allergies were reviewed. The patient is allergic to amoxicillin, aspirin, and Motrin. She has been taking Tylenol and DayQuil. She has a history of high blood pressure and migraines.  Physical Exam:   Vital signs:  BP 140/79  Pulse 82  Temp(Src) 98.3 F (36.8 C) (Oral)  Resp 16  SpO2 100%  LMP 01/04/2013 General:  Alert and oriented.  In no distress.  Skin warm and dry. Eye:  No conjunctival injection or drainage. Lids were normal. ENT:  TMs and canals were normal, without erythema or inflammation.  Nasal mucosa was clear and uncongested, without drainage.  Mucous membranes were moist.  Pharynx was clear with no exudate or drainage.  There were no oral ulcerations or lesions. Neck:  Supple, no adenopathy, tenderness or mass. Lungs:  No respiratory distress.  Lungs were clear to auscultation, without wheezes, rales or rhonchi.  Breath sounds were clear and equal bilaterally.  Heart:   Regular rhythm, without gallops, murmers or rubs. Skin:  Clear, warm, and dry, without rash or lesions.  Labs:   Results for orders placed during the hospital encounter of 01/27/13  POCT RAPID STREP A ([facility] ONLY)      Result Value Range   Streptococcus, Group A Screen (Direct) NEGATIVE  NEGATIVE     Radiology:  Dg Chest 2 View  01/27/2013   CLINICAL DATA:  Cough, fever.  EXAM: CHEST  2 VIEW  COMPARISON:  September 29, 2008.  FINDINGS: The heart size and mediastinal contours are within normal limits. Both lungs are clear. No pneumothorax or pleural effusion is noted. Mild dextroscoliosis of lower thoracic spine is noted.  IMPRESSION: No acute cardiopulmonary abnormality seen.   Electronically Signed   By: Roque Lias M.D.   On: 01/27/2013 08:55   Course in Urgent Care Center:   She was given Tylenol 650 mg by mouth for the pain.  Assessment:  The encounter diagnosis was Influenza-like illness.  She is outside of the treatment window for Tamiflu. Will need symptomatic treatment.  Plan:   1.  Meds:  The following meds were prescribed:   Discharge Medication List as of 01/27/2013  9:13 AM    START taking these medications   Details  chlorpheniramine-HYDROcodone (TUSSIONEX) 10-8 MG/5ML LQCR Take 5 mLs by mouth every 12 (twelve) hours as needed for cough., Starting 01/27/2013, Until Discontinued, Normal    predniSONE (DELTASONE) 20 MG tablet Take 1 tablet (20 mg total) by mouth 2 (two) times daily., Starting 01/27/2013, Until Discontinued, Normal  traMADol (ULTRAM) 50 MG tablet Take 2 tablets (100 mg total) by mouth every 8 (eight) hours as needed., Starting 01/27/2013, Until Discontinued, Normal        2.  Patient Education/Counseling:  The patient was given appropriate handouts, self care instructions, and instructed in symptomatic relief.  Suggested rest, fluids, and taking infectious precautions.  3.  Follow up:  The patient was told to follow up if no better in 3 to 4 days,  if becoming worse in any way, and given some red flag symptoms such as worsening fever or difficulty breathing which would prompt immediate return.  Follow up here if necessary.      Reuben Likes, MD 01/27/13 2222

## 2013-01-29 LAB — CULTURE, GROUP A STREP

## 2013-12-08 ENCOUNTER — Encounter (HOSPITAL_COMMUNITY): Payer: Self-pay | Admitting: Emergency Medicine

## 2014-01-10 ENCOUNTER — Encounter (HOSPITAL_COMMUNITY): Payer: Self-pay | Admitting: Emergency Medicine

## 2014-01-10 ENCOUNTER — Emergency Department (HOSPITAL_COMMUNITY)
Admission: EM | Admit: 2014-01-10 | Discharge: 2014-01-10 | Disposition: A | Discharge status: Home / Self Care | Payer: BC Managed Care – PPO | Attending: Emergency Medicine | Admitting: Emergency Medicine

## 2014-01-10 ENCOUNTER — Emergency Department (HOSPITAL_COMMUNITY): Payer: BC Managed Care – PPO

## 2014-01-10 ENCOUNTER — Other Ambulatory Visit: Payer: Self-pay

## 2014-01-10 DIAGNOSIS — Z3202 Encounter for pregnancy test, result negative: Secondary | ICD-10-CM | POA: Insufficient documentation

## 2014-01-10 DIAGNOSIS — Z8639 Personal history of other endocrine, nutritional and metabolic disease: Secondary | ICD-10-CM | POA: Insufficient documentation

## 2014-01-10 DIAGNOSIS — I10 Essential (primary) hypertension: Secondary | ICD-10-CM | POA: Diagnosis not present

## 2014-01-10 DIAGNOSIS — Z9889 Other specified postprocedural states: Secondary | ICD-10-CM | POA: Insufficient documentation

## 2014-01-10 DIAGNOSIS — J45901 Unspecified asthma with (acute) exacerbation: Secondary | ICD-10-CM | POA: Diagnosis not present

## 2014-01-10 DIAGNOSIS — R0789 Other chest pain: Secondary | ICD-10-CM | POA: Diagnosis not present

## 2014-01-10 DIAGNOSIS — Z79899 Other long term (current) drug therapy: Secondary | ICD-10-CM | POA: Insufficient documentation

## 2014-01-10 DIAGNOSIS — R0600 Dyspnea, unspecified: Secondary | ICD-10-CM | POA: Diagnosis not present

## 2014-01-10 DIAGNOSIS — G43909 Migraine, unspecified, not intractable, without status migrainosus: Secondary | ICD-10-CM | POA: Insufficient documentation

## 2014-01-10 DIAGNOSIS — Z880 Allergy status to penicillin: Secondary | ICD-10-CM | POA: Diagnosis not present

## 2014-01-10 DIAGNOSIS — R0602 Shortness of breath: Secondary | ICD-10-CM | POA: Diagnosis present

## 2014-01-10 HISTORY — DX: Unspecified asthma, uncomplicated: J45.909

## 2014-01-10 LAB — I-STAT TROPONIN, ED
TROPONIN I, POC: 0 ng/mL (ref 0.00–0.08)
Troponin i, poc: 0 ng/mL (ref 0.00–0.08)

## 2014-01-10 LAB — POC URINE PREG, ED: Preg Test, Ur: NEGATIVE

## 2014-01-10 MED ORDER — DEXAMETHASONE SODIUM PHOSPHATE 10 MG/ML IJ SOLN
6.0000 mg | Freq: Once | INTRAMUSCULAR | Status: AC
  Administered 2014-01-10: 6 mg
  Filled 2014-01-10: qty 1

## 2014-01-10 MED ORDER — ACETAMINOPHEN 325 MG PO TABS
650.0000 mg | ORAL_TABLET | Freq: Once | ORAL | Status: AC
  Administered 2014-01-10: 650 mg via ORAL
  Filled 2014-01-10: qty 2

## 2014-01-10 MED ORDER — IPRATROPIUM-ALBUTEROL 0.5-2.5 (3) MG/3ML IN SOLN
3.0000 mL | Freq: Once | RESPIRATORY_TRACT | Status: AC
  Administered 2014-01-10: 3 mL via RESPIRATORY_TRACT

## 2014-01-10 NOTE — ED Notes (Addendum)
Per EMS. Pt from work, reports chest pain for the past week that has gradually gotten better. Started having sob and dizziness while at work today. When ems arrived pt was tachypneic, but upon arrival pt ambulated to restroom independently without assistence

## 2014-01-10 NOTE — ED Provider Notes (Signed)
CSN: [id number]     Arrival date & time 01/10/14  1037 History   First MD Initiated Contact with Patient 01/10/14 1038     Chief Complaint  Patient presents with  . Shortness of Breath     (Consider location/radiation/quality/duration/timing/severity/associated sxs/prior Treatment) HPI Comments: 33 year old female with history of high blood pressure, asthma nonsmoker presents with lightheadedness, shortness of breath and chest tightness. Patient has had mild symptoms past week have the chest tightness started yesterday without specific timing. No exertional chest pain or diaphoresis. Patient was working in the heat and felt worsening symptoms and lightheaded. No family history of cardiac with her parents. Patient has no cardiac history or blood clot history. Patient denies blood clot history, active cancer, recent major trauma or surgery, unilateral leg swelling/ pain, recent long travel, hemoptysis or oral contraceptives. Worse with a deep breath. .  Patient is a 33 y.o. female presenting with shortness of breath. The history is provided by the patient.  Shortness of Breath Associated symptoms: cough   Associated symptoms: no abdominal pain, no chest pain, no fever, no headaches, no neck pain, no rash and no vomiting     Past Medical History  Diagnosis Date  . Migraines   . PCOS (polycystic ovarian syndrome)   . Seizures     c/b anesthesia  . Hypertension   . Asthma    Past Surgical History  Procedure Laterality Date  . Hernia repair    . Nasal reconstruction    . Cesarean section      one previous   Family History  Problem Relation Age of Onset  . Breast cancer Maternal Aunt   . Diabetes Maternal Grandmother   . Diabetes Paternal Grandfather   . Cancer Maternal Aunt     ovarian   History  Substance Use Topics  . Smoking status: Never Smoker   . Smokeless tobacco: Never Used  . Alcohol Use: Yes     Comment: occasionally   OB History    Gravida Para Term Preterm AB  TAB SAB Ectopic Multiple Living   1 1 1       1      Review of Systems  Constitutional: Negative for fever and chills.  HENT: Negative for congestion.   Eyes: Negative for visual disturbance.  Respiratory: Positive for cough, chest tightness and shortness of breath.   Cardiovascular: Negative for chest pain.  Gastrointestinal: Negative for vomiting and abdominal pain.  Genitourinary: Negative for dysuria and flank pain.  Musculoskeletal: Negative for back pain, neck pain and neck stiffness.  Skin: Negative for rash.  Neurological: Negative for light-headedness and headaches.      Allergies  Amoxicillin; Aspirin; and Motrin  Home Medications   Prior to Admission medications   Medication Sig Start Date End Date Taking? Authorizing Provider  acetaminophen (TYLENOL) 500 MG tablet Take 1,000 mg by mouth every 6 (six) hours as needed for headache.   Yes Historical Provider, MD  atenolol (TENORMIN) 25 MG tablet Take 25 mg by mouth daily.   Yes Historical Provider, MD  cyclobenzaprine (FLEXERIL) 5 MG tablet Take 5 mg by mouth 3 (three) times daily as needed for muscle spasms.  12/14/13  Yes Historical Provider, MD  ranitidine (ZANTAC) 150 MG tablet Take 150 mg by mouth 2 (two) times daily.   Yes Historical Provider, MD  Simethicone (GAS RELIEF PO) Take 1 capsule by mouth every 6 (six) hours as needed (for gas).   Yes Historical Provider, MD  chlorpheniramine-HYDROcodone ([place]) 10-8 MG/5ML  LQCR Take 5 mLs by mouth every 12 (twelve) hours as needed for cough. Patient not taking: Reported on 01/10/2014 01/27/13   Harden Mo, MD  hydrochlorothiazide (HYDRODIURIL) 25 MG tablet Take 25 mg by mouth every morning.    Historical Provider, MD  Norgestim-Eth Estrad Triphasic (ORTHO TRI-CYCLEN, 28, PO) Take 1 tablet by mouth daily.    Historical Provider, MD  predniSONE (DELTASONE) 20 MG tablet Take 1 tablet (20 mg total) by mouth 2 (two) times daily. Patient not taking: Reported on 01/10/2014  01/27/13   Harden Mo, MD  traMADol (ULTRAM) 50 MG tablet Take 2 tablets (100 mg total) by mouth every 8 (eight) hours as needed. Patient not taking: Reported on 01/10/2014 01/27/13   Harden Mo, MD  valACYclovir (VALTREX) 500 MG tablet  12/18/13   Historical Provider, MD   BP 124/78 mmHg  Pulse 70  Temp(Src) 98.3 F (36.8 C) (Oral)  Resp 18  SpO2 100%  LMP 12/11/2013 Physical Exam  Constitutional: She is oriented to person, place, and time. She appears well-developed and well-nourished.  HENT:  Head: Normocephalic and atraumatic.  Eyes: Conjunctivae are normal. Right eye exhibits no discharge. Left eye exhibits no discharge.  Neck: Normal range of motion. Neck supple. No tracheal deviation present.  Cardiovascular: Normal rate and regular rhythm.   Pulmonary/Chest: Effort normal. She has wheezes (mild end exp).  Abdominal: Soft. She exhibits no distension. There is no tenderness. There is no guarding.  Musculoskeletal: She exhibits no edema.  Neurological: She is alert and oriented to person, place, and time.  Skin: Skin is warm. No rash noted.  Psychiatric: She has a normal mood and affect.  Nursing note and vitals reviewed.   ED Course  Procedures (including critical care time) Labs Review Labs Reviewed  Randolm Idol, ED  POC URINE PREG, ED  I-STAT TROPOININ, ED    Imaging Review Dg Chest 2 View (if Patient Has Fever And/or Copd)  01/10/2014   CLINICAL DATA:  Chest pain, shortness of breath  EXAM: CHEST  2 VIEW  COMPARISON:  01/27/2013  FINDINGS: The heart size and mediastinal contours are within normal limits. Both lungs are clear. The visualized skeletal structures are unremarkable. Rightward curvature of the thoracic spine centered at T8 reidentified. Hair artifact overlies the lung apices.  IMPRESSION: No active cardiopulmonary disease.   Electronically Signed   By: Conchita Paris M.D.   On: 01/10/2014 12:12     EKG Interpretation None     EKG reviewed  heart rate 81, normal QT, sinus, nonspecific T-wave inversion 2, aVF, V3 and V4. No acute ST elevation. MDM   Final diagnoses:  Chest tightness  Dyspnea   Patient with history of asthma and high blood pressure presents with worsening chest tightness and shortness of breath for a week. Patient has no blood clot risk factors, PE RC negative, vitals normal except very mild elevated blood pressure. Patient not requiring oxygen. EKG reviewed.  Plan for screening troponin, patient has had symptoms since yesterday, patient very low risk cardiac. Plan for breathing treatment with chest tightness shortness of breath and asthma history. Chest x-ray pending  Chest X reviewed no acute findings. Patient received nebulizer and oral steroid, patient feels improved. Second troponin negative. Patient low risk and discussed outpatient follow-up and reasons to return.  Results and differential diagnosis were discussed with the patient/parent/guardian. Close follow up outpatient was discussed, comfortable with the plan.   Medications  ipratropium-albuterol (DUONEB) 0.5-2.5 (3) MG/3ML nebulizer solution 3  mL (3 mLs Nebulization Given 01/10/14 1145)  acetaminophen (TYLENOL) tablet 650 mg (650 mg Oral Given 01/10/14 1241)  dexamethasone (DECADRON) injection 6 mg (6 mg Other Given 01/10/14 1241)    Filed Vitals:   01/10/14 1130 01/10/14 1145 01/10/14 1353 01/10/14 1427  BP:   124/78   Pulse: 90  70   Temp:  98.1 F (36.7 C)  98.3 F (36.8 C)  TempSrc:  Oral  Oral  Resp: 20  18   SpO2: 96%  100%     Final diagnoses:  Chest tightness  Dyspnea       Mariea Clonts, MD 01/10/14 1527

## 2014-01-10 NOTE — Discharge Instructions (Signed)
If you were given medicines take as directed.  If you are on coumadin or contraceptives realize their levels and effectiveness is altered by many different medicines.  If you have any reaction (rash, tongues swelling, other) to the medicines stop taking and see a physician.   Please follow up as directed and return to the ER or see a physician for new or worsening symptoms.  Thank you. Filed Vitals:   01/10/14 1121 01/10/14 1130 01/10/14 1145 01/10/14 1353  BP: 111/77   124/78  Pulse: 70 90  70  Temp:   98.1 F (36.7 C)   TempSrc:   Oral   Resp: 29 20  18   SpO2: 100% 96%  100%

## 2014-01-10 NOTE — ED Notes (Signed)
Bed: CV89 Expected date: 01/10/14 Expected time: 10:39 AM Means of arrival: Ambulance Comments: [facility] ? Anxiety CP x 1 wk

## 2014-01-10 NOTE — ED Notes (Signed)
Patient transported to X-ray 

## 2015-03-28 ENCOUNTER — Emergency Department (HOSPITAL_COMMUNITY): Payer: BLUE CROSS/BLUE SHIELD

## 2015-03-28 ENCOUNTER — Encounter (HOSPITAL_COMMUNITY): Payer: Self-pay | Admitting: Emergency Medicine

## 2015-03-28 DIAGNOSIS — Z880 Allergy status to penicillin: Secondary | ICD-10-CM | POA: Insufficient documentation

## 2015-03-28 DIAGNOSIS — I10 Essential (primary) hypertension: Secondary | ICD-10-CM | POA: Diagnosis not present

## 2015-03-28 DIAGNOSIS — J45909 Unspecified asthma, uncomplicated: Secondary | ICD-10-CM | POA: Diagnosis not present

## 2015-03-28 DIAGNOSIS — G43909 Migraine, unspecified, not intractable, without status migrainosus: Secondary | ICD-10-CM | POA: Insufficient documentation

## 2015-03-28 DIAGNOSIS — R0981 Nasal congestion: Secondary | ICD-10-CM | POA: Diagnosis present

## 2015-03-28 DIAGNOSIS — J101 Influenza due to other identified influenza virus with other respiratory manifestations: Secondary | ICD-10-CM | POA: Diagnosis not present

## 2015-03-28 DIAGNOSIS — Z8639 Personal history of other endocrine, nutritional and metabolic disease: Secondary | ICD-10-CM | POA: Diagnosis not present

## 2015-03-28 DIAGNOSIS — Z793 Long term (current) use of hormonal contraceptives: Secondary | ICD-10-CM | POA: Diagnosis not present

## 2015-03-28 DIAGNOSIS — Z79899 Other long term (current) drug therapy: Secondary | ICD-10-CM | POA: Diagnosis not present

## 2015-03-28 LAB — URINE MICROSCOPIC-ADD ON: BACTERIA UA: NONE SEEN

## 2015-03-28 LAB — URINALYSIS, ROUTINE W REFLEX MICROSCOPIC
Bilirubin Urine: NEGATIVE
Glucose, UA: NEGATIVE mg/dL
KETONES UR: 15 mg/dL — AB
LEUKOCYTES UA: NEGATIVE
NITRITE: NEGATIVE
PH: 6 (ref 5.0–8.0)
Protein, ur: NEGATIVE mg/dL
Specific Gravity, Urine: 1.03 (ref 1.005–1.030)

## 2015-03-28 MED ORDER — ACETAMINOPHEN 325 MG PO TABS
ORAL_TABLET | ORAL | Status: AC
  Filled 2015-03-28: qty 2

## 2015-03-28 MED ORDER — ACETAMINOPHEN 325 MG PO TABS
650.0000 mg | ORAL_TABLET | Freq: Once | ORAL | Status: AC | PRN
  Administered 2015-03-28: 650 mg via ORAL

## 2015-03-28 NOTE — ED Notes (Signed)
Patient transported to X-ray 

## 2015-03-28 NOTE — ED Notes (Signed)
The patient has been coughing, had generalized malaise, fever and pain.  She has been taking theraflu and it is not working.  She rates her pain 5/10.  She has been coughing up yellow sputum.

## 2015-03-29 ENCOUNTER — Emergency Department (HOSPITAL_COMMUNITY)
Admission: EM | Admit: 2015-03-29 | Discharge: 2015-03-29 | Disposition: A | Discharge status: Home / Self Care | Payer: BLUE CROSS/BLUE SHIELD | Attending: Emergency Medicine | Admitting: Emergency Medicine

## 2015-03-29 DIAGNOSIS — J101 Influenza due to other identified influenza virus with other respiratory manifestations: Secondary | ICD-10-CM

## 2015-03-29 MED ORDER — GUAIFENESIN-CODEINE 100-10 MG/5ML PO SOLN: 10.0000 mL | Freq: Four times a day (QID) | ORAL | Status: DC | PRN

## 2015-03-29 NOTE — ED Provider Notes (Signed)
CSN: [id number]     Arrival date & time 03/28/15  2134 History  By signing my name below, I, Nicole Kindred, attest that this documentation has been prepared under the direction and in the presence of Veryl Speak, MD.   Electronically Signed: Nicole Kindred, ED Scribe. 03/29/2015. 3:49 AM     Chief Complaint  Patient presents with  . URI    The patient has been coughing, had generalized malaise, fever and pain.  She has been taking theraflu and it is not working.     Patient is a 35 y.o. female presenting with URI. The history is provided by the patient. No language interpreter was used.  URI Presenting symptoms: congestion, cough and fever   Severity:  Moderate Onset quality:  Gradual Duration:  1 week Timing:  Constant Relieved by:  Nothing Worsened by:  Nothing tried Ineffective treatments:  None tried Associated symptoms: myalgias    HPI Comments: [name] is a 35 y.o. female who presents to the Emergency Department complaining of gradual onset, constant productive cough with yellow phlegm, onset about a week ago. Pt also reports associated intermittent fever with Tmax 103, congestion, and muscle aches. Pt states positive sick contact with son who was diagnosed with the flu. No other alleviating or worsening factors noted. Pt denies vomiting, diarrhea, abdominal pain, and any other pertinent symptoms.      Past Medical History  Diagnosis Date  . Migraines   . PCOS (polycystic ovarian syndrome)   . Seizures ([place])     c/b anesthesia  . Hypertension   . Asthma    Past Surgical History  Procedure Laterality Date  . Hernia repair    . Nasal reconstruction    . Cesarean section      one previous   Family History  Problem Relation Age of Onset  . Breast cancer Maternal Aunt   . Diabetes Maternal Grandmother   . Diabetes Paternal Grandfather   . Cancer Maternal Aunt     ovarian   Social History  Substance Use Topics  . Smoking status: Never Smoker    . Smokeless tobacco: Never Used  . Alcohol Use: Yes     Comment: occasionally   OB History    Gravida Para Term Preterm AB TAB SAB Ectopic Multiple Living   1 1 1       1      Review of Systems  Constitutional: Positive for fever.  HENT: Positive for congestion.   Respiratory: Positive for cough.   Gastrointestinal: Negative for nausea, vomiting, abdominal pain and diarrhea.  Musculoskeletal: Positive for myalgias.  All other systems reviewed and are negative.     Allergies  Amoxicillin; Aspirin; and Motrin  Home Medications   Prior to Admission medications   Medication Sig Start Date End Date Taking? Authorizing Provider  acetaminophen (TYLENOL) 500 MG tablet Take 1,000 mg by mouth every 6 (six) hours as needed for headache.    Historical Provider, MD  atenolol (TENORMIN) 25 MG tablet Take 25 mg by mouth daily.    Historical Provider, MD  chlorpheniramine-HYDROcodone (TUSSIONEX) 10-8 MG/5ML LQCR Take 5 mLs by mouth every 12 (twelve) hours as needed for cough. Patient not taking: Reported on 01/10/2014 01/27/13   Harden Mo, MD  cyclobenzaprine (FLEXERIL) 5 MG tablet Take 5 mg by mouth 3 (three) times daily as needed for muscle spasms.  12/14/13   Historical Provider, MD  hydrochlorothiazide (HYDRODIURIL) 25 MG tablet Take 25 mg by mouth every morning.  Historical Provider, MD  Norgestim-Eth Estrad Triphasic (ORTHO TRI-CYCLEN, 28, PO) Take 1 tablet by mouth daily.    Historical Provider, MD  predniSONE (DELTASONE) 20 MG tablet Take 1 tablet (20 mg total) by mouth 2 (two) times daily. Patient not taking: Reported on 01/10/2014 01/27/13   Harden Mo, MD  ranitidine (ZANTAC) 150 MG tablet Take 150 mg by mouth 2 (two) times daily.    Historical Provider, MD  Simethicone (GAS RELIEF PO) Take 1 capsule by mouth every 6 (six) hours as needed (for gas).    Historical Provider, MD  traMADol (ULTRAM) 50 MG tablet Take 2 tablets (100 mg total) by mouth every 8 (eight) hours as  needed. Patient not taking: Reported on 01/10/2014 01/27/13   Harden Mo, MD  valACYclovir (VALTREX) 500 MG tablet  12/18/13   Historical Provider, MD   BP 121/79 mmHg  Pulse 111  Temp(Src) 101.2 F (38.4 C) (Oral)  SpO2 99%  LMP 03/28/2015 Physical Exam  Constitutional: She is oriented to person, place, and time. She appears well-developed and well-nourished. No distress.  HENT:  Head: Normocephalic and atraumatic.  Eyes: EOM are normal.  Neck: Normal range of motion.  Cardiovascular: Normal rate, regular rhythm and normal heart sounds.   No murmur heard. Pulmonary/Chest: Effort normal and breath sounds normal. No respiratory distress. She has no wheezes. She has no rales.  Abdominal: Soft. She exhibits no distension. There is no tenderness.  Musculoskeletal: Normal range of motion. She exhibits no edema or tenderness.  Neurological: She is alert and oriented to person, place, and time.  Skin: Skin is warm and dry.  Psychiatric: She has a normal mood and affect. Judgment normal.  Nursing note and vitals reviewed.   ED Course  Procedures (including critical care time) DIAGNOSTIC STUDIES: Oxygen Saturation is 99% on RA, normal by my interpretation.    COORDINATION OF CARE: 3:45 AM-Discussed treatment plan which includes tylenol/motrin for fever control, and rest with pt at bedside and pt agreed to plan.   Labs Review Labs Reviewed  URINALYSIS, ROUTINE W REFLEX MICROSCOPIC (NOT AT [facility]) - Abnormal; Notable for the following:    Hgb urine dipstick SMALL (*)    Ketones, ur 15 (*)    All other components within normal limits  URINE MICROSCOPIC-ADD ON - Abnormal; Notable for the following:    Squamous Epithelial / LPF 0-5 (*)    All other components within normal limits    Imaging Review Dg Chest 2 View  03/28/2015  CLINICAL DATA:  Cough and fever, intermittent for 1 week. Upper respiratory infection. EXAM: CHEST  2 VIEW COMPARISON:  01/10/2014 FINDINGS: The  cardiomediastinal contours are normal. The lungs are clear. Pulmonary vasculature is normal. No consolidation, pleural effusion, or pneumothorax. Scoliotic curvature of the thoracic spine, unchanged from prior. No acute osseous abnormalities are seen. IMPRESSION: No acute pulmonary process. Electronically Signed   By: Jeb Levering M.D.   On: 03/28/2015 23:12   I have personally reviewed and evaluated these images and lab results as part of my medical decision-making.    MDM   Final diagnoses:  None   Patient presents with complaints of fevers, congestion, body aches, and cough. She tells me that her daughter was recently diagnosed with "the flu". The patient's presentation, exam are both consistent with a viral upper respiratory infection, possibly influenza. She will be treated with cough medication, anti-inflammatories and Tylenol, and when necessary return. There is no hypoxia, she is nontoxic appearing, and vital signs  are stable.   I personally performed the services described in this documentation, which was scribed in my presence. The recorded information has been reviewed and is accurate.       Veryl Speak, MD 03/29/15 [phone]

## 2015-03-29 NOTE — Discharge Instructions (Signed)
Tylenol 1000 mg rotated with Motrin 600 mg every 4 hours as needed for pain or fever.  Robitussin with codeine as prescribed as needed for cough.  Return to the emergency department if symptoms significantly worsen or change.   Influenza, Adult Influenza ("the flu") is a viral infection of the respiratory tract. It occurs more often in winter months because people spend more time in close contact with one another. Influenza can make you feel very sick. Influenza easily spreads from person to person (contagious). CAUSES  Influenza is caused by a virus that infects the respiratory tract. You can catch the virus by breathing in droplets from an infected person's cough or sneeze. You can also catch the virus by touching something that was recently contaminated with the virus and then touching your mouth, nose, or eyes. RISKS AND COMPLICATIONS You may be at risk for a more severe case of influenza if you smoke cigarettes, have diabetes, have chronic heart disease (such as heart failure) or lung disease (such as asthma), or if you have a weakened immune system. Elderly people and pregnant women are also at risk for more serious infections. The most common problem of influenza is a lung infection (pneumonia). Sometimes, this problem can require emergency medical care and may be life threatening. SIGNS AND SYMPTOMS  Symptoms typically last 4 to 10 days and may include:  Fever.  Chills.  Headache, body aches, and muscle aches.  Sore throat.  Chest discomfort and cough.  Poor appetite.  Weakness or feeling tired.  Dizziness.  Nausea or vomiting. DIAGNOSIS  Diagnosis of influenza is often made based on your history and a physical exam. A nose or throat swab test can be done to confirm the diagnosis. TREATMENT  In mild cases, influenza goes away on its own. Treatment is directed at relieving symptoms. For more severe cases, your health care provider may prescribe antiviral medicines to  shorten the sickness. Antibiotic medicines are not effective because the infection is caused by a virus, not by bacteria. HOME CARE INSTRUCTIONS  Take medicines only as directed by your health care provider.  Use a cool mist humidifier to make breathing easier.  Get plenty of rest until your temperature returns to normal. This usually takes 3 to 4 days.  Drink enough fluid to keep your urine clear or pale yellow.  Cover yourmouth and nosewhen coughing or sneezing,and wash your handswellto prevent thevirusfrom spreading.  Stay homefromwork orschool untilthe fever is gonefor at least 34full day. PREVENTION  An annual influenza vaccination (flu shot) is the best way to avoid getting influenza. An annual flu shot is now routinely recommended for all adults in the [place] IF:  You experiencechest pain, yourcough worsens,or you producemore mucus.  Youhave nausea,vomiting, ordiarrhea.  Your fever returns or gets worse. SEEK IMMEDIATE MEDICAL CARE IF:  You havetrouble breathing, you become short of breath,or your skin ornails becomebluish.  You have severe painor stiffnessin the neck.  You develop a sudden headache, or pain in the face or ear.  You have nausea or vomiting that you cannot control. MAKE SURE YOU:   Understand these instructions.  Will watch your condition.  Will get help right away if you are not doing well or get worse.   This information is not intended to replace advice given to you by your health care provider. Make sure you discuss any questions you have with your health care provider.   Document Released: 01/21/2000 Document Revised: 02/13/2014 Document Reviewed: 04/24/2011 Elsevier  Interactive Patient Education ©2016 [employer]. ° °

## 2015-03-29 NOTE — ED Notes (Signed)
Pt stable, ambulatory, states understanding of discharge instructions 

## 2015-04-08 ENCOUNTER — Other Ambulatory Visit: Payer: Self-pay | Admitting: Nurse Practitioner

## 2015-04-08 ENCOUNTER — Other Ambulatory Visit (HOSPITAL_COMMUNITY)
Admission: RE | Admit: 2015-04-08 | Discharge: 2015-04-08 | Disposition: A | Discharge status: Home / Self Care | Payer: BLUE CROSS/BLUE SHIELD | Source: Ambulatory Visit | Attending: Nurse Practitioner | Admitting: Nurse Practitioner

## 2015-04-08 DIAGNOSIS — Z01419 Encounter for gynecological examination (general) (routine) without abnormal findings: Secondary | ICD-10-CM | POA: Diagnosis present

## 2015-04-08 DIAGNOSIS — Z1151 Encounter for screening for human papillomavirus (HPV): Secondary | ICD-10-CM | POA: Insufficient documentation

## 2015-04-09 LAB — CYTOLOGY - PAP

## 2015-05-11 DIAGNOSIS — N910 Primary amenorrhea: Secondary | ICD-10-CM | POA: Diagnosis not present

## 2015-05-11 DIAGNOSIS — R102 Pelvic and perineal pain: Secondary | ICD-10-CM | POA: Diagnosis not present

## 2015-07-27 DIAGNOSIS — R8290 Unspecified abnormal findings in urine: Secondary | ICD-10-CM | POA: Diagnosis not present

## 2015-07-27 DIAGNOSIS — M940 Chondrocostal junction syndrome [Tietze]: Secondary | ICD-10-CM | POA: Diagnosis not present

## 2015-10-04 ENCOUNTER — Emergency Department (HOSPITAL_COMMUNITY)
Admission: EM | Admit: 2015-10-04 | Discharge: 2015-10-04 | Disposition: A | Discharge status: Home / Self Care | Payer: BLUE CROSS/BLUE SHIELD | Attending: Emergency Medicine | Admitting: Emergency Medicine

## 2015-10-04 ENCOUNTER — Encounter (HOSPITAL_COMMUNITY): Payer: Self-pay

## 2015-10-04 DIAGNOSIS — S40212A Abrasion of left shoulder, initial encounter: Secondary | ICD-10-CM | POA: Diagnosis not present

## 2015-10-04 DIAGNOSIS — Y999 Unspecified external cause status: Secondary | ICD-10-CM | POA: Diagnosis not present

## 2015-10-04 DIAGNOSIS — S39012A Strain of muscle, fascia and tendon of lower back, initial encounter: Secondary | ICD-10-CM | POA: Diagnosis not present

## 2015-10-04 DIAGNOSIS — Y92410 Unspecified street and highway as the place of occurrence of the external cause: Secondary | ICD-10-CM | POA: Diagnosis not present

## 2015-10-04 DIAGNOSIS — I10 Essential (primary) hypertension: Secondary | ICD-10-CM | POA: Diagnosis not present

## 2015-10-04 DIAGNOSIS — S3992XA Unspecified injury of lower back, initial encounter: Secondary | ICD-10-CM | POA: Diagnosis present

## 2015-10-04 DIAGNOSIS — S161XXA Strain of muscle, fascia and tendon at neck level, initial encounter: Secondary | ICD-10-CM | POA: Insufficient documentation

## 2015-10-04 DIAGNOSIS — J45909 Unspecified asthma, uncomplicated: Secondary | ICD-10-CM | POA: Insufficient documentation

## 2015-10-04 DIAGNOSIS — Y939 Activity, unspecified: Secondary | ICD-10-CM | POA: Diagnosis not present

## 2015-10-04 MED ORDER — PREDNISONE 20 MG PO TABS: ORAL_TABLET | ORAL | 0 refills | Status: DC

## 2015-10-04 MED ORDER — HYDROCODONE-ACETAMINOPHEN 5-325 MG PO TABS: 1.0000 | ORAL_TABLET | Freq: Four times a day (QID) | ORAL | 0 refills | Status: DC | PRN
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
## 2015-10-15 DIAGNOSIS — E559 Vitamin D deficiency, unspecified: Secondary | ICD-10-CM | POA: Diagnosis not present

## 2015-10-15 DIAGNOSIS — Z0000 Encounter for general adult medical examination without abnormal findings: Secondary | ICD-10-CM | POA: Diagnosis not present

## 2015-10-22 LAB — GLUCOSE, POCT (MANUAL RESULT ENTRY): POC GLUCOSE: 116 mg/dL — AB (ref 70–99)

## 2015-11-19 DIAGNOSIS — G44011 Episodic cluster headache, intractable: Secondary | ICD-10-CM | POA: Diagnosis not present

## 2015-11-19 DIAGNOSIS — R21 Rash and other nonspecific skin eruption: Secondary | ICD-10-CM | POA: Diagnosis not present

## 2015-12-20 ENCOUNTER — Other Ambulatory Visit: Payer: Self-pay | Admitting: Internal Medicine

## 2015-12-20 ENCOUNTER — Ambulatory Visit
Admission: RE | Admit: 2015-12-20 | Discharge: 2015-12-20 | Disposition: A | Discharge status: Home / Self Care | Payer: BLUE CROSS/BLUE SHIELD | Source: Ambulatory Visit | Attending: Internal Medicine | Admitting: Internal Medicine

## 2015-12-20 DIAGNOSIS — L309 Dermatitis, unspecified: Secondary | ICD-10-CM | POA: Diagnosis not present

## 2015-12-20 DIAGNOSIS — G44011 Episodic cluster headache, intractable: Secondary | ICD-10-CM

## 2015-12-20 DIAGNOSIS — J342 Deviated nasal septum: Secondary | ICD-10-CM | POA: Diagnosis not present

## 2016-01-17 DIAGNOSIS — L292 Pruritus vulvae: Secondary | ICD-10-CM | POA: Diagnosis not present

## 2016-04-13 ENCOUNTER — Other Ambulatory Visit: Payer: Self-pay | Admitting: Obstetrics & Gynecology

## 2016-04-13 DIAGNOSIS — R21 Rash and other nonspecific skin eruption: Secondary | ICD-10-CM | POA: Diagnosis not present

## 2016-04-13 DIAGNOSIS — L292 Pruritus vulvae: Secondary | ICD-10-CM | POA: Diagnosis not present

## 2016-04-13 DIAGNOSIS — L739 Follicular disorder, unspecified: Secondary | ICD-10-CM | POA: Diagnosis not present

## 2016-04-13 DIAGNOSIS — Z8742 Personal history of other diseases of the female genital tract: Secondary | ICD-10-CM | POA: Diagnosis not present

## 2016-04-28 ENCOUNTER — Emergency Department (HOSPITAL_COMMUNITY)
Admission: EM | Admit: 2016-04-28 | Discharge: 2016-04-28 | Disposition: A | Discharge status: Home / Self Care | Payer: BLUE CROSS/BLUE SHIELD | Attending: Emergency Medicine | Admitting: Emergency Medicine

## 2016-04-28 DIAGNOSIS — R51 Headache: Secondary | ICD-10-CM | POA: Insufficient documentation

## 2016-04-28 DIAGNOSIS — R42 Dizziness and giddiness: Secondary | ICD-10-CM | POA: Diagnosis present

## 2016-04-28 DIAGNOSIS — Z79899 Other long term (current) drug therapy: Secondary | ICD-10-CM | POA: Insufficient documentation

## 2016-04-28 DIAGNOSIS — I10 Essential (primary) hypertension: Secondary | ICD-10-CM | POA: Diagnosis not present

## 2016-04-28 DIAGNOSIS — J45909 Unspecified asthma, uncomplicated: Secondary | ICD-10-CM | POA: Diagnosis not present

## 2016-04-28 DIAGNOSIS — R519 Headache, unspecified: Secondary | ICD-10-CM

## 2016-04-28 MED ORDER — ONDANSETRON 4 MG PO TBDP
4.0000 mg | ORAL_TABLET | Freq: Once | ORAL | Status: AC
  Administered 2016-04-28: 4 mg via ORAL
  Filled 2016-04-28: qty 1

## 2016-04-28 MED ORDER — SUMATRIPTAN SUCCINATE 50 MG PO TABS
50.0000 mg | ORAL_TABLET | Freq: Once | ORAL | Status: AC
  Administered 2016-04-28: 50 mg via ORAL
  Filled 2016-04-28: qty 1

## 2016-04-28 NOTE — ED Notes (Signed)
Discussed discharge paperwork including medications with patient and family.  Pt vitals are within normal limits.  Pt ambulatory.

## 2016-04-28 NOTE — ED Provider Notes (Signed)
[facility] DEPT Provider Note   CSN: [id number] Arrival date & time: 04/28/16  4259     History   Chief Complaint Chief Complaint  Patient presents with  . Dizziness    HPI [name] is a 36 y.o. female.  The history is provided by the patient and medical records.  Dizziness  Associated symptoms: headaches    36 year old female with a PMH of migraines reports to the ED today complaining of a headache x 3 days. The headache is unilateral isolated to the left-side and throbbing in nature. The patient notes associated symptoms of photophobia as well as nausea and dizziness beginning this morning. Reports one episode of emesis. The patient states that this is similar to previous migraines. She takes Topamax daily as prophylactic therapy. The patient reports taking Topamax and 1000mg  of Tylenol this morning without relief. She reports relief with Sumatriptan in the past but notes this medication exacerbates the nausea she typically experiences with migraines. Denies any syncopal events, dizziness, weakness, confusion, changes in speech, tinnitus, blurred vision or difficulty walking.  No chest pain or SOB.  No abdominal pain.  Not currently on anticoagulation.  Not followed by neurology currently.  Past Medical History:  Diagnosis Date  . Asthma   . Hypertension   . Migraines   . PCOS (polycystic ovarian syndrome)   . Seizures ([place])    c/b anesthesia    Patient Active Problem List   Diagnosis Date Noted  . Axillary lump 06/25/2012    Past Surgical History:  Procedure Laterality Date  . CESAREAN SECTION     one previous  . HERNIA REPAIR    . NASAL RECONSTRUCTION      OB History    Gravida Para Term Preterm AB Living   1 1 1     1    SAB TAB Ectopic Multiple Live Births                   Home Medications    Prior to Admission medications   Medication Sig Start Date End Date Taking? Authorizing Provider  acetaminophen (TYLENOL) 500 MG tablet Take 1,000 mg  by mouth every 6 (six) hours as needed for headache.    Historical Provider, MD  atenolol (TENORMIN) 25 MG tablet Take 25 mg by mouth daily.    Historical Provider, MD  chlorpheniramine-HYDROcodone (TUSSIONEX) 10-8 MG/5ML LQCR Take 5 mLs by mouth every 12 (twelve) hours as needed for cough. Patient not taking: Reported on 01/10/2014 01/27/13   Harden Mo, MD  cyclobenzaprine (FLEXERIL) 5 MG tablet Take 5 mg by mouth 3 (three) times daily as needed for muscle spasms.  12/14/13   Historical Provider, MD  guaiFENesin-codeine 100-10 MG/5ML syrup Take 10 mLs by mouth every 6 (six) hours as needed for cough. 03/29/15   Veryl Speak, MD  hydrochlorothiazide (HYDRODIURIL) 25 MG tablet Take 25 mg by mouth every morning.    Historical Provider, MD  HYDROcodone-acetaminophen (NORCO) 5-325 MG tablet Take 1 tablet by mouth every 6 (six) hours as needed for severe pain. 10/04/15   Margarita Mail, PA-C  Norgestim-Eth Estrad Triphasic (ORTHO TRI-CYCLEN, 28, PO) Take 1 tablet by mouth daily.    Historical Provider, MD  predniSONE (DELTASONE) 20 MG tablet 3 tabs po daily x 3 days, then 2 tabs x 3 days, then 1.5 tabs x 3 days, then 1 tab x 3 days, then 0.5 tabs x 3 days 10/04/15   Margarita Mail, PA-C  ranitidine (ZANTAC) 150 MG tablet Take 150  mg by mouth 2 (two) times daily.    Historical Provider, MD  Simethicone (GAS RELIEF PO) Take 1 capsule by mouth every 6 (six) hours as needed (for gas).    Historical Provider, MD  traMADol (ULTRAM) 50 MG tablet Take 2 tablets (100 mg total) by mouth every 8 (eight) hours as needed. Patient not taking: Reported on 01/10/2014 01/27/13   Harden Mo, MD  valACYclovir (VALTREX) 500 MG tablet  12/18/13   Historical Provider, MD    Family History Family History  Problem Relation Age of Onset  . Breast cancer Maternal Aunt   . Diabetes Maternal Grandmother   . Diabetes Paternal Grandfather   . Cancer Maternal Aunt     ovarian    Social History Social History  Substance  Use Topics  . Smoking status: Never Smoker  . Smokeless tobacco: Never Used  . Alcohol use Yes     Comment: occasionally     Allergies   Amoxicillin; Aspirin; and Motrin [ibuprofen]   Review of Systems Review of Systems  Eyes: Positive for photophobia.  Neurological: Positive for dizziness and headaches.  All other systems reviewed and are negative.    Physical Exam Updated Vital Signs BP (!) 163/100 (BP Location: Right Arm)   Pulse 67   Temp 98.3 F (36.8 C) (Oral)   Resp 16   LMP 04/26/2016   SpO2 100%   Physical Exam  Constitutional: She is oriented to person, place, and time. She appears well-developed and well-nourished. No distress.  HENT:  Head: Normocephalic and atraumatic.  Right Ear: External ear normal.  Left Ear: External ear normal.  Mouth/Throat: Oropharynx is clear and moist.  Eyes: Conjunctivae and EOM are normal. Pupils are equal, round, and reactive to light.  Neck: Normal range of motion and full passive range of motion without pain. Neck supple. No neck rigidity.  No rigidity, no meningismus  Cardiovascular: Normal rate, regular rhythm and normal heart sounds.   No murmur heard. Pulmonary/Chest: Effort normal and breath sounds normal. No respiratory distress. She has no wheezes. She has no rhonchi.  Abdominal: Soft. Bowel sounds are normal. There is no tenderness. There is no rebound and no guarding.  Musculoskeletal: Normal range of motion. She exhibits no edema.  Neurological: She is alert and oriented to person, place, and time. She has normal strength. She displays no tremor. No cranial nerve deficit or sensory deficit. She displays no seizure activity.  AAOx3, answering questions and following commands appropriately; equal strength UE and LE bilaterally; CN grossly intact; moves all extremities appropriately without ataxia; no focal neuro deficits or facial asymmetry appreciated  Skin: Skin is warm and dry. No rash noted. She is not diaphoretic.   Psychiatric: She has a normal mood and affect. Her behavior is normal. Thought content normal.  Nursing note and vitals reviewed.    ED Treatments / Results  Labs (all labs ordered are listed, but only abnormal results are displayed) Labs Reviewed - No data to display  EKG  EKG Interpretation None       Radiology No results found.  Procedures Procedures (including critical care time)  Medications Ordered in ED Medications - No data to display   Initial Impression / Assessment and Plan / ED Course  I have reviewed the triage vital signs and the nursing notes.  Pertinent labs & imaging results that were available during my care of the patient were reviewed by me and considered in my medical decision making (see chart for details).  36 y.o. F here with headache and dizziness.  Hx of migraines, reports this feels similar.  She is afebrile, non-toxic.  Neurologic exam is non-focal.  No signs/symptoms concerning for meningitis.  Not currently on anti-coagulation.  Low suspicion for TIA, CVA, ICH, SAH, or infectious process.  I discussed options of treatment with patient of IVF and migraine cocktail vs PO medications as she has had good relief with triptans in the past.  She opted for oral medications to avoid needles.  This was given with some improvement.  Patient has been resting comfortably here.  States she feels ok to go home and continue resting. VSS. I have referred her to OP neurology if continues having frequent headaches.  Discussed plan with patient, she acknowledged understanding and agreed with plan of care.  Return precautions given for new or worsening symptoms.  Final Clinical Impressions(s) / ED Diagnoses   Final diagnoses:  Nonintractable headache, unspecified chronicity pattern, unspecified headache type    New Prescriptions Discharge Medication List as of 04/28/2016 12:20 PM       Larene Pickett, PA-C 04/28/16 1423    Gareth Morgan, MD 04/28/16  2322

## 2016-04-28 NOTE — ED Notes (Signed)
Pt refused wheelchair. Pt ambulated to room from waiting room, tolerated well.

## 2016-04-28 NOTE — ED Triage Notes (Addendum)
Pt presents to ED with complaints of headache x3 days and dizziness with nausea.  Pt stated she took 1000mg  of tylenol at 0200 this am before coming to ED.  Only noticed a slight relief.

## 2016-04-28 NOTE — Discharge Instructions (Signed)
Can continue taking your topamax.  Would recommend excedrin should headache recur. If your continue having ongoing or more frequent headaches-- may be of some benefit to see neurology.  I have given you information for local offices. Return to the ED for new or worsening symptoms.

## 2016-05-11 ENCOUNTER — Ambulatory Visit (INDEPENDENT_AMBULATORY_CARE_PROVIDER_SITE_OTHER): Payer: BLUE CROSS/BLUE SHIELD | Admitting: Neurology

## 2016-05-11 ENCOUNTER — Encounter: Payer: Self-pay | Admitting: Neurology

## 2016-05-11 DIAGNOSIS — G43019 Migraine without aura, intractable, without status migrainosus: Secondary | ICD-10-CM | POA: Diagnosis not present

## 2016-05-11 HISTORY — DX: Migraine without aura, intractable, without status migrainosus: G43.019

## 2016-05-11 MED ORDER — ONDANSETRON HCL 4 MG PO TABS: 4.0000 mg | ORAL_TABLET | Freq: Three times a day (TID) | ORAL | 3 refills | Status: DC | PRN

## 2016-05-11 MED ORDER — PREDNISONE 5 MG PO TABS: ORAL_TABLET | ORAL | 0 refills | Status: DC

## 2016-05-11 MED ORDER — TOPIRAMATE 25 MG PO TABS: ORAL_TABLET | ORAL | 3 refills | Status: DC

## 2016-05-11 NOTE — Progress Notes (Signed)
Reason for visit: Migraine headache  Referring physician: Tol[place]  [name] is a 36 y.o. female  History of present illness:  [name] is a 36 year old right-handed black female with a history of intractable migraine headache. The patient has had headaches since age 66, the headaches have been more frequent over the last 3 years occurring 2 or 3 times a month. Within the last 3 months, the patient has had converted migraine with daily headaches. The headaches are more significant, and may be severe for 2 or 3 days at a time. The patient is taking Imitrex if needed with some benefit, and she has been on very low-dose Topamax 25 mg at night for one year. The patient reports that the headaches are bifrontal in nature, around the eyes, occasionally spread throughout the head. The patient may have some blurring of vision, she has photophobia and phonophobia, she denies any significant neck stiffness or scalp tenderness. The patient denies any visual loss, she may have some cognitive slowing with headache. She denies any focal numbness or weakness of the face, arms, or legs. She does note some occasional nausea and vomiting with the headache. The patient denies a family history of migraine, she does indicate that certain odors may bring on headaches. She does not know of any other particular activating factors for her headache. The patient has not been on any other preventative medications other than Topamax for her headache in the past. She is working and she may miss an occasional day of work due to the headache. She may take Tylenol if needed for the headache. She states that in 2009 she had a single seizure event during general anesthesia for a uterine polyp resection.  Past Medical History:  Diagnosis Date  . Asthma   . Hypertension   . Migraines   . PCOS (polycystic ovarian syndrome)   . Seizures ([place])    c/b anesthesia    Past Surgical History:  Procedure Laterality Date  .  CESAREAN SECTION     one previous  . HERNIA REPAIR    . NASAL RECONSTRUCTION      Family History  Problem Relation Age of Onset  . Breast cancer Maternal Aunt   . Diabetes Maternal Grandmother   . Diabetes Paternal Grandfather   . Cancer Maternal Aunt     ovarian    Social history:  reports that she has never smoked. She has never used smokeless tobacco. She reports that she drinks alcohol. She reports that she does not use drugs.  Medications:  Prior to Admission medications   Medication Sig Start Date End Date Taking? Authorizing Provider  Acetaminophen-Caffeine (EXCEDRIN TENSION HEADACHE) 500-65 MG TABS Take by mouth daily as needed.   Yes Historical Provider, MD  SUMAtriptan (IMITREX) 100 MG tablet Take 100 mg by mouth 2 (two) times daily as needed. 04/28/16  Yes Historical Provider, MD  topiramate (TOPAMAX) 25 MG tablet Take 25 mg by mouth at bedtime.  04/07/16  Yes Historical Provider, MD  valACYclovir (VALTREX) 500 MG tablet Take 500 mg by mouth daily as needed. 04/22/16  Yes Historical Provider, MD      Allergies  Allergen Reactions  . Amoxicillin Hives and Nausea And Vomiting  . Aspirin Hives and Nausea And Vomiting  . Motrin [Ibuprofen] Hives and Nausea And Vomiting    ROS:  Out of a complete 14 system review of symptoms, the patient complains only of the following symptoms, and all other reviewed systems are negative.  Fatigue Eye pain Headache Not enough sleep  Blood pressure (!) 156/95, pulse 61, resp. rate 20, height 5\' 6"  (1.676 m), weight 163 lb (73.9 kg), last menstrual period 04/26/2016.  Physical Exam  General: The patient is alert and cooperative at the time of the examination.  Eyes: Pupils are equal, round, and reactive to light. Discs are flat bilaterally.  Neck: The neck is supple, no carotid bruits are noted.  Respiratory: The respiratory examination is clear.  Cardiovascular: The cardiovascular examination reveals a regular rate and rhythm,  no obvious murmurs or rubs are noted.  Neuromuscular: Range of movement of the cervical spine is full, no crepitus is noted in the temporomandibular joints.  Skin: Extremities are without significant edema.  Neurologic Exam  Mental status: The patient is alert and oriented x 3 at the time of the examination. The patient has apparent normal recent and remote memory, with an apparently normal attention span and concentration ability.  Cranial nerves: Facial symmetry is present. There is good sensation of the face to pinprick and soft touch bilaterally. The strength of the facial muscles and the muscles to head turning and shoulder shrug are normal bilaterally. Speech is well enunciated, no aphasia or dysarthria is noted. Extraocular movements are full. Visual fields are full. The tongue is midline, and the patient has symmetric elevation of the soft palate. No obvious hearing deficits are noted.  Motor: The motor testing reveals 5 over 5 strength of all 4 extremities. Good symmetric motor tone is noted throughout.  Sensory: Sensory testing is intact to pinprick, soft touch, vibration sensation, and position sense on all 4 extremities. No evidence of extinction is noted.  Coordination: Cerebellar testing reveals good finger-nose-finger and heel-to-shin bilaterally.  Gait and station: Gait is normal. Tandem gait is normal. Romberg is negative. No drift is seen.  Reflexes: Deep tendon reflexes are symmetric and normal bilaterally. Toes are downgoing bilaterally.   CT head and cervical 09/08/11:  IMPRESSION: 1.  No acute displaced skull fractures or acute intracranial abnormalities. 2.  The appearance of the brain is normal.  CT CERVICAL SPINE  Findings: No acute displaced fractures of the cervical spine. Alignment is anatomic.  Prevertebral soft tissues are normal. Visualized portions of the upper thorax are unremarkable.  IMPRESSION: No acute abnormality of the cervical spine.   *  CT scan images were reviewed online. I agree with the written report.    Assessment/Plan:  1. Common migraine, intractable  The patient has converted migraine over the last 3 months, she is now having daily headaches. The patient is on very low-dose Topamax, the dose will be increased to 50 mg for a week and then go to 75 mg. The patient will call us for any dose adjustments. The patient was placed on a prednisone Dosepak, 5 mg 6 day pack. She will be given Zofran if needed for nausea. She will follow-up in 3 months.   Jill Alexanders MD 05/11/2016 8:25 AM  [facility] [address] [place], [place] [address]  Phone [phone] Fax [phone]

## 2016-05-11 NOTE — Patient Instructions (Signed)
  We will increase the Topamax dose.  Topamax (topiramate) is a seizure medication that has an FDA approval for seizures and for migraine headache. Potential side effects of this medication include weight loss, cognitive slowing, tingling in the fingers and toes, and carbonated drinks will taste bad. If any significant side effects are noted on this drug, please contact our office.

## 2016-06-13 ENCOUNTER — Encounter (HOSPITAL_COMMUNITY): Payer: Self-pay | Admitting: Emergency Medicine

## 2016-06-13 ENCOUNTER — Emergency Department (HOSPITAL_COMMUNITY): Payer: BLUE CROSS/BLUE SHIELD

## 2016-06-13 ENCOUNTER — Emergency Department (HOSPITAL_COMMUNITY)
Admission: EM | Admit: 2016-06-13 | Discharge: 2016-06-13 | Disposition: A | Discharge status: Home / Self Care | Payer: BLUE CROSS/BLUE SHIELD | Attending: Emergency Medicine | Admitting: Emergency Medicine

## 2016-06-13 DIAGNOSIS — Z79899 Other long term (current) drug therapy: Secondary | ICD-10-CM | POA: Insufficient documentation

## 2016-06-13 DIAGNOSIS — G43909 Migraine, unspecified, not intractable, without status migrainosus: Secondary | ICD-10-CM | POA: Diagnosis not present

## 2016-06-13 DIAGNOSIS — G43009 Migraine without aura, not intractable, without status migrainosus: Secondary | ICD-10-CM

## 2016-06-13 DIAGNOSIS — J45909 Unspecified asthma, uncomplicated: Secondary | ICD-10-CM | POA: Insufficient documentation

## 2016-06-13 DIAGNOSIS — I10 Essential (primary) hypertension: Secondary | ICD-10-CM | POA: Diagnosis not present

## 2016-06-13 DIAGNOSIS — R0789 Other chest pain: Secondary | ICD-10-CM | POA: Diagnosis not present

## 2016-06-13 DIAGNOSIS — R079 Chest pain, unspecified: Secondary | ICD-10-CM | POA: Diagnosis not present

## 2016-06-13 DIAGNOSIS — G4489 Other headache syndrome: Secondary | ICD-10-CM | POA: Diagnosis not present

## 2016-06-13 LAB — BASIC METABOLIC PANEL
Anion gap: 8 (ref 5–15)
BUN: 12 mg/dL (ref 6–20)
CALCIUM: 8.9 mg/dL (ref 8.9–10.3)
CHLORIDE: 109 mmol/L (ref 101–111)
CO2: 22 mmol/L (ref 22–32)
CREATININE: 1.06 mg/dL — AB (ref 0.44–1.00)
GFR calc non Af Amer: >60 mL/min (ref >60)
Glucose, Bld: 99 mg/dL (ref 65–99)
Potassium: 3.4 mmol/L — ABNORMAL LOW (ref 3.5–5.1)
SODIUM: 139 mmol/L (ref 135–145)

## 2016-06-13 LAB — CBC
HCT: 36.1 % (ref 36.0–46.0)
Hemoglobin: 11.9 g/dL — ABNORMAL LOW (ref 12.0–15.0)
MCH: 29.6 pg (ref 26.0–34.0)
MCHC: 33 g/dL (ref 30.0–36.0)
MCV: 89.8 fL (ref 78.0–100.0)
PLATELETS: 245 10*3/uL (ref 150–400)
RBC: 4.02 MIL/uL (ref 3.87–5.11)
RDW: 13.8 % (ref 11.5–15.5)
WBC: 5.5 10*3/uL (ref 4.0–10.5)

## 2016-06-13 LAB — I-STAT TROPONIN, ED: TROPONIN I, POC: 0 ng/mL (ref 0.00–0.08)

## 2016-06-13 MED ORDER — KETOROLAC TROMETHAMINE 30 MG/ML IJ SOLN
30.0000 mg | Freq: Once | INTRAMUSCULAR | Status: AC
  Administered 2016-06-13: 30 mg via INTRAVENOUS
  Filled 2016-06-13: qty 1

## 2016-06-13 MED ORDER — PROCHLORPERAZINE EDISYLATE 5 MG/ML IJ SOLN
10.0000 mg | Freq: Once | INTRAMUSCULAR | Status: AC
  Administered 2016-06-13: 10 mg via INTRAVENOUS
  Filled 2016-06-13: qty 2

## 2016-06-13 MED ORDER — DIPHENHYDRAMINE HCL 50 MG/ML IJ SOLN
25.0000 mg | Freq: Once | INTRAMUSCULAR | Status: AC
  Administered 2016-06-13: 25 mg via INTRAVENOUS
  Filled 2016-06-13: qty 1

## 2016-06-13 NOTE — ED Triage Notes (Signed)
BIB EMS from home, HA started approx 30 min ago, pt has hx of migraines. Pt lethargic and slow to respond. Also reports central CP with no radiation.

## 2016-06-13 NOTE — ED Provider Notes (Signed)
[facility] DEPT Provider Note   CSN: [id number] Arrival date & time: 06/13/16  0155  By signing my name below, I, Oleh Genin, attest that this documentation has been prepared under the direction and in the presence of Horton, Barbette Hair, MD. Electronically Signed: Oleh Genin, Scribe. 06/13/16. 2:37 AM.   History   Chief Complaint Chief Complaint  Patient presents with  . Headache  . Chest Pain    HPI [name] is a 36 y.o. female with history of hypertension and  migraines who presents to the ED for evaluation of chest pain. This patient states that two hours ago she woke from sleep with severe chest "heaviness" and dyspnea prompting her to come to the emergency department. Non-radiating. No other complaints. At interview, her symptoms have improved to a 4/10 in severity. She denies any recent travel, use of exogenous hormone therapies, or peripheral edema. No cardiac history. She is also reporting a severe headache currently. She has history of migraines of similar character. No focal weaknesses or decreased sensation. No fever, nausea, vomiting, or cough.  The history is provided by the patient. No language interpreter was used.  Headache   This is a new problem. The current episode started 2 days ago. The problem occurs every few hours. The problem has not changed since onset.The pain is located in the frontal and temporal region. The pain is severe. The pain does not radiate. Associated symptoms include shortness of breath. Pertinent negatives include no fever, no nausea and no vomiting.    Past Medical History:  Diagnosis Date  . Asthma   . Common migraine with intractable migraine 05/11/2016  . Hypertension   . Migraines   . PCOS (polycystic ovarian syndrome)   . Seizures ([place])    c/b anesthesia    Patient Active Problem List   Diagnosis Date Noted  . Common migraine with intractable migraine 05/11/2016  . Axillary lump 06/25/2012    Past Surgical  History:  Procedure Laterality Date  . CESAREAN SECTION     one previous  . HERNIA REPAIR    . NASAL RECONSTRUCTION      OB History    Gravida Para Term Preterm AB Living   1 1 1     1    SAB TAB Ectopic Multiple Live Births                   Home Medications    Prior to Admission medications   Medication Sig Start Date End Date Taking? Authorizing Provider  topiramate (TOPAMAX) 25 MG tablet Take 2 tablets at night for one week, then take 3 tablets at night. Patient taking differently: Take 75 mg by mouth every evening.  05/11/16  Yes Kathrynn Ducking, MD  ondansetron (ZOFRAN) 4 MG tablet Take 1 tablet (4 mg total) by mouth every 8 (eight) hours as needed for nausea or vomiting. Patient not taking: Reported on 06/13/2016 05/11/16   Kathrynn Ducking, MD  predniSONE (DELTASONE) 5 MG tablet Begin taking 6 tablets daily, taper by one tablet daily until off the medication. Patient not taking: Reported on 06/13/2016 05/11/16   Kathrynn Ducking, MD    Family History Family History  Problem Relation Age of Onset  . Breast cancer Maternal Aunt   . Diabetes Maternal Grandmother   . Diabetes Paternal Grandfather   . Cancer Maternal Aunt     ovarian    Social History Social History  Substance Use Topics  . Smoking status: Never Smoker  .  Smokeless tobacco: Never Used  . Alcohol use Yes     Comment: occasionally     Allergies   Amoxicillin; Aspirin; and Motrin [ibuprofen]   Review of Systems Review of Systems  Constitutional: Negative for fever.  Eyes: Negative for photophobia and visual disturbance.  Respiratory: Positive for shortness of breath. Negative for cough.   Cardiovascular: Positive for chest pain.  Gastrointestinal: Negative for nausea and vomiting.  Neurological: Positive for headaches. Negative for weakness.  All other systems reviewed and are negative.    Physical Exam Updated Vital Signs BP (!) 157/98   Pulse (!) 59   Temp 98.2 F (36.8 C) (Oral)    Resp 15   Ht 5\' 6"  (1.676 m)   Wt 160 lb (72.6 kg)   LMP 05/30/2016   SpO2 100%   BMI 25.82 kg/m   Physical Exam  Constitutional: She is oriented to person, place, and time. She appears well-developed and well-nourished.  HENT:  Head: Normocephalic and atraumatic.  Eyes: Pupils are equal, round, and reactive to light.  Neck: Normal range of motion. Neck supple.  Cardiovascular: Normal rate, regular rhythm and normal heart sounds.   Pulmonary/Chest: Effort normal. No respiratory distress. She has no wheezes. She exhibits tenderness.  Abdominal: Soft. Bowel sounds are normal.  Neurological: She is alert and oriented to person, place, and time.  Cranial nerves II through XII intact, 5 out of 5 strength in all 4 extremities, no dysmetria to finger-nose-finger  Skin: Skin is warm and dry.  Psychiatric: She has a normal mood and affect.  Nursing note and vitals reviewed.    ED Treatments / Results  Labs (all labs ordered are listed, but only abnormal results are displayed) Labs Reviewed  BASIC METABOLIC PANEL - Abnormal; Notable for the following:       Result Value   Potassium 3.4 (*)    Creatinine, Ser 1.06 (*)    All other components within normal limits  CBC - Abnormal; Notable for the following:    Hemoglobin 11.9 (*)    All other components within normal limits  I-STAT TROPOININ, ED    EKG  EKG Interpretation  Date/Time:  Tuesday Jun 13 2016 02:00:31 EDT Ventricular Rate:  89 PR Interval:    QRS Duration: 99 QT Interval:  392 QTC Calculation: 477 R Axis:   73 Text Interpretation:  Sinus rhythm Probable left atrial enlargement RSR' in V1 or V2, probably normal variant Borderline prolonged QT interval Confirmed by Thayer Jew [phone]) on 06/13/2016 2:20:12 AM       Radiology Dg Chest Portable 1 View  Result Date: 06/13/2016 CLINICAL DATA:  36 y/o  F; chest pain. EXAM: PORTABLE CHEST 1 VIEW COMPARISON:  03/28/2015 chest radiograph FINDINGS: Stable heart size  and mediastinal contours are within normal limits. Both lungs are clear. Mild reverse S curvature of thoracic spine. No acute osseous abnormality is evident. IMPRESSION: No active disease. Electronically Signed   By: Kristine Garbe M.D.   On: 06/13/2016 02:33    Procedures Procedures (including critical care time)  Medications Ordered in ED Medications  prochlorperazine (COMPAZINE) injection 10 mg (10 mg Intravenous Given 06/13/16 0248)  diphenhydrAMINE (BENADRYL) injection 25 mg (25 mg Intravenous Given 06/13/16 0249)  ketorolac (TORADOL) 30 MG/ML injection 30 mg (30 mg Intravenous Given 06/13/16 0248)     Initial Impression / Assessment and Plan / ED Course  I have reviewed the triage vital signs and the nursing notes.  Pertinent labs & imaging results that were  available during my care of the patient were reviewed by me and considered in my medical decision making (see chart for details).     Patient presents with headache and chest wall pain. States that she woke up with chest pain and has had headache for several days. She is nontoxic-appearing. Vital signs reassuring. Does have a history of migraines. Chest pain is reproducible on exam.  Initial workup is reassuring. She is P ERCP negative. Troponin negative. EKG is nonischemic. She was given a migraine cocktail. On reevaluation she is resting comfortably. Reports improvement of her headache.  After history, exam, and medical workup I feel the patient has been appropriately medically screened and is safe for discharge home. Pertinent diagnoses were discussed with the patient. Patient was given return precautions.   Final Clinical Impressions(s) / ED Diagnoses   Final diagnoses:  Chest wall pain  Migraine without aura and without status migrainosus, not intractable    New Prescriptions New Prescriptions   No medications on file   I personally performed the services described in this documentation, which was scribed in my  presence. The recorded information has been reviewed and is accurate.    Merryl Hacker, MD 06/13/16 [phone]

## 2016-07-17 ENCOUNTER — Ambulatory Visit (INDEPENDENT_AMBULATORY_CARE_PROVIDER_SITE_OTHER): Payer: BLUE CROSS/BLUE SHIELD | Admitting: Neurology

## 2016-07-17 ENCOUNTER — Encounter: Payer: Self-pay | Admitting: Neurology

## 2016-07-17 VITALS — BP 122/77 | HR 70 | Ht 66.0 in | Wt 161.0 lb

## 2016-07-17 DIAGNOSIS — G43019 Migraine without aura, intractable, without status migrainosus: Secondary | ICD-10-CM | POA: Diagnosis not present

## 2016-07-17 MED ORDER — TOPIRAMATE 50 MG PO TABS: 150.0000 mg | ORAL_TABLET | Freq: Every day | ORAL | 3 refills | Status: DC

## 2016-07-17 MED ORDER — RIZATRIPTAN BENZOATE 10 MG PO TABS: 10.0000 mg | ORAL_TABLET | Freq: Three times a day (TID) | ORAL | 3 refills | Status: DC | PRN

## 2016-07-17 NOTE — Patient Instructions (Signed)
   With the Topamax 50 mg tablet take 2 at night for one week, then take 3 at night.  Stop the Imitrex, we will try Maxalt for the headache to take if needed.

## 2016-07-17 NOTE — Progress Notes (Signed)
Reason for visit: Migraine headache  [name] is an 36 y.o. female  History of present illness:  [name] is a 36 year old right-handed black female with a history of migraine headaches. The patient was having daily headaches when she was seen in April 2018, her headaches have done better on the Topamax, but she is still having 2 headaches a week. The headaches may last all day long. The patient has gained benefit from Imitrex but Imitrex makes her feel woozy and nauseated. She does not like taking this medication. She returns to the office for further evaluation. She currently is on 75 mg Topamax at night.  Past Medical History:  Diagnosis Date  . Asthma   . Common migraine with intractable migraine 05/11/2016  . Hypertension   . Migraines   . PCOS (polycystic ovarian syndrome)   . Seizures ([place])    c/b anesthesia    Past Surgical History:  Procedure Laterality Date  . CESAREAN SECTION     one previous  . HERNIA REPAIR    . NASAL RECONSTRUCTION      Family History  Problem Relation Age of Onset  . Breast cancer Maternal Aunt   . Diabetes Maternal Grandmother   . Diabetes Paternal Grandfather   . Cancer Maternal Aunt        ovarian    Social history:  reports that she has never smoked. She has never used smokeless tobacco. She reports that she drinks alcohol. She reports that she does not use drugs.    Allergies  Allergen Reactions  . Amoxicillin Hives and Nausea And Vomiting  . Aspirin Hives and Nausea And Vomiting  . Motrin [Ibuprofen] Hives and Nausea And Vomiting    Medications:  Prior to Admission medications   Medication Sig Start Date End Date Taking? Authorizing Provider  ondansetron (ZOFRAN) 4 MG tablet Take 1 tablet (4 mg total) by mouth every 8 (eight) hours as needed for nausea or vomiting. 05/11/16  Yes Kathrynn Ducking, MD  SUMAtriptan (IMITREX) 100 MG tablet Take 100 mg by mouth every 2 (two) hours as needed for migraine. May repeat in 2  hours if headache persists or recurs.   Yes [provider]  topiramate (TOPAMAX) 25 MG tablet Take 2 tablets at night for one week, then take 3 tablets at night. Patient taking differently: Take 75 mg by mouth every evening.  05/11/16  Yes Kathrynn Ducking, MD    ROS:  Out of a complete 14 system review of symptoms, the patient complains only of the following symptoms, and all other reviewed systems are negative.  Fatigue Headache  Blood pressure 122/77, pulse 70, height 5\' 6"  (1.676 m), weight 161 lb (73 kg).  Physical Exam  General: The patient is alert and cooperative at the time of the examination.  Skin: No significant peripheral edema is noted.   Neurologic Exam  Mental status: The patient is alert and oriented x 3 at the time of the examination. The patient has apparent normal recent and remote memory, with an apparently normal attention span and concentration ability.   Cranial nerves: Facial symmetry is present. Speech is normal, no aphasia or dysarthria is noted. Extraocular movements are full. Visual fields are full.  Motor: The patient has good strength in all 4 extremities.  Sensory examination: Soft touch sensation is symmetric on the face, arms, and legs.  Coordination: The patient has good finger-nose-finger and heel-to-shin bilaterally.  Gait and station: The patient has a normal gait.  Tandem gait is normal. Romberg is negative. No drift is seen.  Reflexes: Deep tendon reflexes are symmetric.   Assessment/Plan:  1. Migraine headache  The patient will be increased on the Topamax taking 100 mg night for one week and then go to 150 mg at night. A prescription was called in for the 50 mg tablets. The patient will be taken off of Imitrex and switched over to Maxalt to see if she can tolerate the drug better. The patient will follow-up for her next scheduled appointment in September.  Jill Alexanders MD 07/17/2016 3:42 PM  [facility] [address] [place], [place] [address]  Phone [phone] Fax [phone]

## 2016-07-30 ENCOUNTER — Encounter (HOSPITAL_COMMUNITY): Payer: Self-pay | Admitting: Emergency Medicine

## 2016-07-30 ENCOUNTER — Ambulatory Visit (HOSPITAL_COMMUNITY)
Admission: EM | Admit: 2016-07-30 | Discharge: 2016-07-30 | Disposition: A | Discharge status: Home / Self Care | Payer: BLUE CROSS/BLUE SHIELD | Attending: Family Medicine | Admitting: Family Medicine

## 2016-07-30 DIAGNOSIS — H01021 Squamous blepharitis right upper eyelid: Secondary | ICD-10-CM

## 2016-07-30 DIAGNOSIS — H04129 Dry eye syndrome of unspecified lacrimal gland: Secondary | ICD-10-CM | POA: Diagnosis not present

## 2016-07-30 MED ORDER — POLYETHYL GLYCOL-PROPYL GLYCOL 0.4-0.3 % OP SOLN: 1.0000 [drp] | Freq: Four times a day (QID) | OPHTHALMIC | 0 refills | Status: DC

## 2016-07-30 MED ORDER — POLYETHYL GLYCOL-PROPYL GLYCOL 0.4-0.3 % OP GEL: 1.0000 "application " | Freq: Every day | OPHTHALMIC | 0 refills | Status: DC

## 2016-07-30 NOTE — ED Provider Notes (Signed)
CSN: [id number]     Arrival date & time 07/30/16  1925 History   None    Chief Complaint  Patient presents with  . Eye Problem    right   (Consider location/radiation/quality/duration/timing/severity/associated sxs/prior Treatment) 36 yo female comes in with 4-5 day history of swollen eyelid of the right eye. Patient has been traveling with family and was unable to seek treatment until now. She has not tried anything for it. Has had some pain with blinking, and noticed film around eye when blinking. Some dry eyes. Have noticed some discharge from the corner of her eye. Denies vision changes, photophobia, red eyes.       Past Medical History:  Diagnosis Date  . Asthma   . Common migraine with intractable migraine 05/11/2016  . Hypertension   . Migraines   . PCOS (polycystic ovarian syndrome)   . Seizures ([place])    c/b anesthesia   Past Surgical History:  Procedure Laterality Date  . CESAREAN SECTION     one previous  . HERNIA REPAIR    . NASAL RECONSTRUCTION     Family History  Problem Relation Age of Onset  . Breast cancer Maternal Aunt   . Diabetes Maternal Grandmother   . Diabetes Paternal Grandfather   . Cancer Maternal Aunt        ovarian   Social History  Substance Use Topics  . Smoking status: Never Smoker  . Smokeless tobacco: Never Used  . Alcohol use Yes     Comment: occasionally   OB History    Gravida Para Term Preterm AB Living   1 1 1     1    SAB TAB Ectopic Multiple Live Births                 Review of Systems  Constitutional: Negative for chills, diaphoresis and fever.  HENT: Negative for congestion, ear discharge, ear pain, facial swelling, postnasal drip, rhinorrhea, sinus pain, sinus pressure, sneezing, sore throat and trouble swallowing.   Eyes: Positive for discharge. Negative for photophobia, pain, redness, itching and visual disturbance.       Eyelid itching and pain.  Respiratory: Negative for cough, shortness of breath and wheezing.    Cardiovascular: Negative for chest pain and palpitations.  Skin: Negative for rash.    Allergies  Amoxicillin; Aspirin; and Motrin [ibuprofen]  Home Medications   Prior to Admission medications   Medication Sig Start Date End Date Taking? Authorizing Provider  topiramate (TOPAMAX) 50 MG tablet Take 3 tablets (150 mg total) by mouth at bedtime. 07/17/16  Yes Kathrynn Ducking, MD  ondansetron (ZOFRAN) 4 MG tablet Take 1 tablet (4 mg total) by mouth every 8 (eight) hours as needed for nausea or vomiting. 05/11/16   Kathrynn Ducking, MD  Polyethyl Glycol-Propyl Glycol (SYSTANE) 0.4-0.3 % GEL ophthalmic gel Place 1 application into both eyes at bedtime. 07/30/16   Tasia Catchings, Chriselda Leppert V, PA-C  Polyethyl Glycol-Propyl Glycol (SYSTANE) 0.4-0.3 % SOLN Apply 1-2 drops to eye every 6 (six) hours. 07/30/16   Tasia Catchings, Angelos Wasco V, PA-C  rizatriptan (MAXALT) 10 MG tablet Take 1 tablet (10 mg total) by mouth 3 (three) times daily as needed for migraine. 07/17/16   Kathrynn Ducking, MD   Meds Ordered and Administered this Visit  Medications - No data to display  BP 133/76 (BP Location: Left Arm)   Pulse 69   Temp 98.2 F (36.8 C) (Oral)   LMP 07/28/2016 (Exact Date)   SpO2 100%  No data found.   Physical Exam  Constitutional: She is oriented to person, place, and time. She appears well-developed and well-nourished. No distress.  HENT:  Head: Normocephalic and atraumatic.  Eyes: Conjunctivae and EOM are normal. Pupils are equal, round, and reactive to light. Right conjunctiva is not injected. Left conjunctiva is not injected.  Right eyelid swollen, erythematous. No hordeolum/chalazion noted. No discharge noted.   Cardiovascular: Normal rate and regular rhythm.  Exam reveals no gallop and no friction rub.   No murmur heard. Pulmonary/Chest: Effort normal and breath sounds normal.  Neurological: She is alert and oriented to person, place, and time.  Skin: Skin is warm and dry.  Psychiatric: She has a normal mood and  affect. Her behavior is normal. Judgment normal.    Urgent Care Course     Procedures (including critical care time)  Labs Review Labs Reviewed - No data to display  Imaging Review No results found.       MDM   1. Squamous blepharitis of right upper eyelid   2. Dry eye    1. Discussed with patient history and exam most consistent with blepharitis and dry eyes. Discussed possibility of developing hordeolum/chalazion, but treatment provided today would be the same. Start warm compresses and massage. Start artificial tear drops and gel for dry eyes. Baby shampoo for eyelid washes.   Ok Edwards, PA-C 07/30/16 2037

## 2016-07-30 NOTE — Discharge Instructions (Signed)
Warm compresses and massages. Use artificial tear drops and gel as directed. Baby shampoo daily to wash eyelashes/lid

## 2016-07-30 NOTE — ED Triage Notes (Signed)
Pt reports swelling in her right eye and mild pain in the corner of her right eye lid since Wednesday.  She also report a little bit of drainage.

## 2016-09-18 DIAGNOSIS — J019 Acute sinusitis, unspecified: Secondary | ICD-10-CM | POA: Diagnosis not present

## 2016-09-18 DIAGNOSIS — J209 Acute bronchitis, unspecified: Secondary | ICD-10-CM | POA: Diagnosis not present

## 2016-09-29 DIAGNOSIS — N760 Acute vaginitis: Secondary | ICD-10-CM | POA: Diagnosis not present

## 2016-09-29 DIAGNOSIS — F418 Other specified anxiety disorders: Secondary | ICD-10-CM | POA: Diagnosis not present

## 2016-10-23 DIAGNOSIS — Z136 Encounter for screening for cardiovascular disorders: Secondary | ICD-10-CM | POA: Diagnosis not present

## 2016-10-23 DIAGNOSIS — Z0000 Encounter for general adult medical examination without abnormal findings: Secondary | ICD-10-CM | POA: Diagnosis not present

## 2016-10-26 ENCOUNTER — Ambulatory Visit (INDEPENDENT_AMBULATORY_CARE_PROVIDER_SITE_OTHER): Payer: BLUE CROSS/BLUE SHIELD | Admitting: Neurology

## 2016-10-26 ENCOUNTER — Encounter: Payer: Self-pay | Admitting: Neurology

## 2016-10-26 VITALS — BP 110/73 | HR 67 | Ht 66.0 in | Wt 157.0 lb

## 2016-10-26 DIAGNOSIS — G43019 Migraine without aura, intractable, without status migrainosus: Secondary | ICD-10-CM

## 2016-10-26 MED ORDER — ELETRIPTAN HYDROBROMIDE 40 MG PO TABS: 40.0000 mg | ORAL_TABLET | Freq: Two times a day (BID) | ORAL | 3 refills | Status: DC | PRN

## 2016-10-26 NOTE — Progress Notes (Signed)
Reason for visit: Migraine headache  [name] is an 36 y.o. female  History of present illness:  [name] is a 36 year old right-handed black female with history of migraine headache. She is now on 150 mg of the Topamax at night, she is tolerating this well. The patient was given a trial on Maxalt but she believes that this has not been effective. The Imitrex did help her headaches but was not well-tolerated. She is having very few headaches at this time, she is only had about 3 headaches since last seen in mid June 2018. The patient is pleased with her response to the medications. She returns for an evaluation.  Past Medical History:  Diagnosis Date  . Asthma   . Common migraine with intractable migraine 05/11/2016  . Hypertension   . Migraines   . PCOS (polycystic ovarian syndrome)   . Seizures ([place])    c/b anesthesia    Past Surgical History:  Procedure Laterality Date  . CESAREAN SECTION     one previous  . HERNIA REPAIR    . NASAL RECONSTRUCTION      Family History  Problem Relation Age of Onset  . Breast cancer Maternal Aunt   . Diabetes Maternal Grandmother   . Diabetes Paternal Grandfather   . Cancer Maternal Aunt        ovarian    Social history:  reports that she has never smoked. She has never used smokeless tobacco. She reports that she drinks alcohol. She reports that she does not use drugs.    Allergies  Allergen Reactions  . Amoxicillin Hives and Nausea And Vomiting  . Aspirin Hives and Nausea And Vomiting  . Motrin [Ibuprofen] Hives and Nausea And Vomiting    Medications:  Prior to Admission medications   Medication Sig Start Date End Date Taking? Authorizing Provider  ondansetron (ZOFRAN) 4 MG tablet Take 1 tablet (4 mg total) by mouth every 8 (eight) hours as needed for nausea or vomiting. 05/11/16  Yes Kathrynn Ducking, MD  rizatriptan (MAXALT) 10 MG tablet Take 1 tablet (10 mg total) by mouth 3 (three) times daily as needed for  migraine. 07/17/16  Yes Kathrynn Ducking, MD  topiramate (TOPAMAX) 50 MG tablet Take 3 tablets (150 mg total) by mouth at bedtime. 07/17/16  Yes Kathrynn Ducking, MD    ROS:  Out of a complete 14 system review of symptoms, the patient complains only of the following symptoms, and all other reviewed systems are negative.  Headache  Blood pressure 110/73, pulse 67, height 5\' 6"  (1.676 m), weight 157 lb (71.2 kg).  Physical Exam  General: The patient is alert and cooperative at the time of the examination.  Skin: No significant peripheral edema is noted.   Neurologic Exam  Mental status: The patient is alert and oriented x 3 at the time of the examination. The patient has apparent normal recent and remote memory, with an apparently normal attention span and concentration ability.   Cranial nerves: Facial symmetry is present. Speech is normal, no aphasia or dysarthria is noted. Extraocular movements are full. Visual fields are full.  Motor: The patient has good strength in all 4 extremities.  Sensory examination: Soft touch sensation is symmetric on the face, arms, and legs.  Coordination: The patient has good finger-nose-finger and heel-to-shin bilaterally.  Gait and station: The patient has a normal gait. Tandem gait is normal. Romberg is negative. No drift is seen.  Reflexes: Deep tendon reflexes are symmetric.  Assessment/Plan:  1. Migraine headache  The patient is doing well currently on the Topamax at 150 mg daily dosing. She is not getting benefit from Maxalt, we will discontinue this medication and try Relpax. She will follow-up in 4 months.  Jill Alexanders MD 10/26/2016 1:46 PM  [facility] [address] [place], [place] [address]  Phone [phone] Fax [phone]

## 2016-12-18 ENCOUNTER — Other Ambulatory Visit: Payer: Self-pay | Admitting: Neurology

## 2016-12-25 ENCOUNTER — Ambulatory Visit (HOSPITAL_COMMUNITY)
Admission: EM | Admit: 2016-12-25 | Discharge: 2016-12-25 | Disposition: A | Discharge status: Other Acute Inpatient Hospital | Payer: BLUE CROSS/BLUE SHIELD

## 2016-12-26 ENCOUNTER — Encounter (HOSPITAL_COMMUNITY): Payer: Self-pay | Admitting: Neurology

## 2016-12-26 ENCOUNTER — Emergency Department (HOSPITAL_COMMUNITY)
Admission: EM | Admit: 2016-12-26 | Discharge: 2016-12-26 | Disposition: A | Discharge status: Home / Self Care | Payer: BLUE CROSS/BLUE SHIELD | Attending: Emergency Medicine | Admitting: Emergency Medicine

## 2016-12-26 ENCOUNTER — Emergency Department (HOSPITAL_BASED_OUTPATIENT_CLINIC_OR_DEPARTMENT_OTHER)
Admit: 2016-12-26 | Discharge: 2016-12-26 | Disposition: A | Discharge status: Home / Self Care | Payer: BLUE CROSS/BLUE SHIELD

## 2016-12-26 ENCOUNTER — Encounter (HOSPITAL_COMMUNITY): Payer: Self-pay | Admitting: Family Medicine

## 2016-12-26 ENCOUNTER — Ambulatory Visit (HOSPITAL_COMMUNITY)
Admission: EM | Admit: 2016-12-26 | Discharge: 2016-12-26 | Disposition: A | Discharge status: Home / Self Care | Payer: BLUE CROSS/BLUE SHIELD | Attending: Family Medicine | Admitting: Family Medicine

## 2016-12-26 DIAGNOSIS — J45909 Unspecified asthma, uncomplicated: Secondary | ICD-10-CM | POA: Insufficient documentation

## 2016-12-26 DIAGNOSIS — M79605 Pain in left leg: Secondary | ICD-10-CM

## 2016-12-26 DIAGNOSIS — M79609 Pain in unspecified limb: Secondary | ICD-10-CM | POA: Diagnosis not present

## 2016-12-26 DIAGNOSIS — I10 Essential (primary) hypertension: Secondary | ICD-10-CM | POA: Diagnosis not present

## 2016-12-26 DIAGNOSIS — M79662 Pain in left lower leg: Secondary | ICD-10-CM | POA: Insufficient documentation

## 2016-12-26 MED ORDER — METHOCARBAMOL 500 MG PO TABS: 500.0000 mg | ORAL_TABLET | Freq: Two times a day (BID) | ORAL | 0 refills | Status: DC

## 2016-12-26 NOTE — ED Notes (Signed)
Pt verbalized understanding discharge instructions and denies any further needs or questions at this time. VS stable, ambulatory and steady gait.   

## 2016-12-26 NOTE — Discharge Instructions (Signed)
We need you to go to the emergency department for an ultrasound evaluation of the tender nodule to rule out a clot.

## 2016-12-26 NOTE — ED Provider Notes (Signed)
[facility] EMERGENCY DEPARTMENT Provider Note   CSN: [id number] Arrival date & time: 12/26/16  1054     History   Chief Complaint Chief Complaint  Patient presents with  . Leg Pain    HPI [name] is a 36 y.o. female.  HPI  36 y.o. female, presents to the Emergency Department today due to left leg pain. Seen by [facility] and sent for DVT study. Noted bruising and nodule on left anterior calf x 2-3 weeks. Unsure of trauma to area. Notes area TTP. No erythema. No numbness/tingling. No fevers. Rates pain 5/10. Worse with palpation and ambulation. No hx DVT/PE. No other symptoms noted   Past Medical History:  Diagnosis Date  . Asthma   . Common migraine with intractable migraine 05/11/2016  . Hypertension   . Migraines   . PCOS (polycystic ovarian syndrome)   . Seizures ([place])    c/b anesthesia    Patient Active Problem List   Diagnosis Date Noted  . Common migraine with intractable migraine 05/11/2016  . Axillary lump 06/25/2012    Past Surgical History:  Procedure Laterality Date  . CESAREAN SECTION     one previous  . HERNIA REPAIR    . NASAL RECONSTRUCTION      OB History    Gravida Para Term Preterm AB Living   1 1 1     1    SAB TAB Ectopic Multiple Live Births                   Home Medications    Prior to Admission medications   Medication Sig Start Date End Date Taking? Authorizing Provider  eletriptan (RELPAX) 40 MG tablet Take 1 tablet (40 mg total) by mouth 2 (two) times daily as needed for migraine or headache. 10/26/16   Kathrynn Ducking, MD  methocarbamol (ROBAXIN) 500 MG tablet Take 1 tablet (500 mg total) by mouth 2 (two) times daily. 12/26/16   Shary Decamp, PA-C  ondansetron (ZOFRAN) 4 MG tablet Take 1 tablet (4 mg total) by mouth every 8 (eight) hours as needed for nausea or vomiting. 05/11/16   Kathrynn Ducking, MD  topiramate (TOPAMAX) 50 MG tablet TAKE 3 TABLETS BY MOUTH ONCE DAILY AT BEDTIME 12/18/16   Kathrynn Ducking,  MD    Family History Family History  Problem Relation Age of Onset  . Breast cancer Maternal Aunt   . Diabetes Maternal Grandmother   . Diabetes Paternal Grandfather   . Cancer Maternal Aunt        ovarian    Social History Social History   Tobacco Use  . Smoking status: Never Smoker  . Smokeless tobacco: Never Used  Substance Use Topics  . Alcohol use: Yes    Comment: occasionally  . Drug use: No     Allergies   Amoxicillin; Aspirin; and Motrin [ibuprofen]   Review of Systems Review of Systems ROS reviewed and all are negative for acute change except as noted in the HPI.  Physical Exam Updated Vital Signs BP 133/79 (BP Location: Right Arm)   Pulse (!) 58   Temp 97.9 F (36.6 C) (Oral)   Resp 13   Ht 5' 6.5" (1.689 m)   LMP 12/15/2016   SpO2 100%   BMI 24.96 kg/m   Physical Exam  Constitutional: She is oriented to person, place, and time. Vital signs are normal. She appears well-developed and well-nourished.  HENT:  Head: Normocephalic and atraumatic.  Right Ear: Hearing normal.  Left Ear: Hearing normal.  Eyes: Conjunctivae and EOM are normal. Pupils are equal, round, and reactive to light.  Neck: Normal range of motion. Neck supple.  Cardiovascular: Normal rate and regular rhythm.  Pulmonary/Chest: Effort normal.  Musculoskeletal: Normal range of motion.  Ecchymosis noted on left anterior tibia. Small nodule palpable in the middle of the ecchymosis. Mild TTP. No erythema. No calf tenderness. Neg Homans. NVI. Distal pulses appreciated   Neurological: She is alert and oriented to person, place, and time.  Skin: Skin is warm and dry.  Psychiatric: She has a normal mood and affect. Her speech is normal and behavior is normal. Thought content normal.  Nursing note and vitals reviewed.    ED Treatments / Results  Labs (all labs ordered are listed, but only abnormal results are displayed) Labs Reviewed - No data to display  EKG  EKG  Interpretation None       Radiology No results found.  Procedures Procedures (including critical care time)  Medications Ordered in ED Medications - No data to display   Initial Impression / Assessment and Plan / ED Course  I have reviewed the triage vital signs and the nursing notes.  Pertinent labs & imaging results that were available during my care of the patient were reviewed by me and considered in my medical decision making (see chart for details).  Final Clinical Impressions(s) / ED Diagnoses   {I have reviewed and evaluated the relevant imaging studies.  {I have reviewed the relevant previous healthcare records.  {I obtained HPI from historian.   ED Course:  Assessment: Pt is a 36 y.o. female presents to the Emergency Department today due to left leg pain. Seen by [facility] and sent for DVT study. Noted bruising and nodule on left anterior calf x 2-3 weeks. Unsure of trauma to area. Notes area TTP. No erythema. No numbness/tingling. No fevers. Rates pain 5/10. Worse with palpation and ambulation. No hx DVT/PE. On exam, pt in NAD. Nontoxic/nonseptic appearing. VSS. Afebrile. Ecchymosis noted on left anterior tibia. Small nodule palpable in the middle of the ecchymosis. Mild TTP. No erythema. No calf tenderness. Neg Homans. NVI. Distal pulses appreciated. DVT US negative for DVT. Likely bruising. Treat symptomatically. Plan is to DC home with follow up to PCP. At time of discharge, Patient is in no acute distress. Vital Signs are stable. Patient is able to ambulate. Patient able to tolerate PO.   Disposition/Plan:  DC Home Additional Verbal discharge instructions given and discussed with patient.  Pt Instructed to f/u with PCP in the next week for evaluation and treatment of symptoms. Return precautions given Pt acknowledges and agrees with plan  Supervising Physician [place], *  Final diagnoses:  Left leg pain    ED Discharge Orders        Ordered     methocarbamol (ROBAXIN) 500 MG tablet  2 times daily     12/26/16 1639       Shary Decamp, PA-C 12/26/16 [place], MD 12/27/16 [phone]

## 2016-12-26 NOTE — ED Provider Notes (Signed)
[facility]   [id number] 12/26/16 Arrival Time: 0959   SUBJECTIVE:  [name] is a 36 y.o. female who presents to the urgent care with complaint of left leg discomfort.  She's had a spontaneous bruise and subcutaneous tender nodule along the medial tibia of the left leg for three weeks without change in discomfort, although the bruise is subsiding  No chest pain or shortness of breath.  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  Married.     Past Medical History:  Diagnosis Date  . Asthma   . Common migraine with intractable migraine 05/11/2016  . Hypertension   . Migraines   . PCOS (polycystic ovarian syndrome)   . Seizures ([facility])    c/b anesthesia   Family History  Problem Relation Age of Onset  . Breast cancer Maternal Aunt   . Diabetes Maternal Grandmother   . Diabetes Paternal Grandfather   . Cancer Maternal Aunt        ovarian   Social History   Socioeconomic History  . Marital status: Married    Spouse name: Not on file  . Number of children: Not on file  . Years of education: Not on file  . Highest education level: Not on file  Social Needs  . Financial resource strain: Not on file  . Food insecurity - worry: Not on file  . Food insecurity - inability: Not on file  . Transportation needs - medical: Not on file  . Transportation needs - non-medical: Not on file  Occupational History  . Not on file  Tobacco Use  . Smoking status: Never Smoker  . Smokeless tobacco: Never Used  Substance and Sexual Activity  . Alcohol use: Yes    Comment: occasionally  . Drug use: No  . Sexual activity: Yes    Birth control/protection: Pill  Other Topics Concern  . Not on file  Social History Narrative  . Not on file   Current Meds  Medication Sig  . eletriptan (RELPAX) 40 MG tablet Take 1 tablet (40 mg total) by mouth 2 (two) times daily as needed for migraine or headache.  . ondansetron (ZOFRAN) 4 MG tablet Take 1 tablet (4 mg  total) by mouth every 8 (eight) hours as needed for nausea or vomiting.  . topiramate (TOPAMAX) 50 MG tablet TAKE 3 TABLETS BY MOUTH ONCE DAILY AT BEDTIME   Allergies  Allergen Reactions  . Amoxicillin Hives and Nausea And Vomiting  . Aspirin Hives and Nausea And Vomiting  . Motrin [Ibuprofen] Hives and Nausea And Vomiting      ROS: As per HPI, remainder of ROS negative.   OBJECTIVE:   Vitals:   12/26/16 1018  BP: 138/86  Pulse: 61  Resp: 20  Temp: 97.7 F (36.5 C)  TempSrc: Oral  SpO2: 100%     General appearance: alert; no distress Eyes: PERRL; EOMI; conjunctiva normal HENT: normocephalic; atraumatic;  external ears normal without trauma; nasal mucosa normal; oral mucosa normal Neck: supple Lungs: no resp distress Back: no CVA tenderness Extremities: no cyanosis or edema; symmetrical with no gross deformities; tender nodule over greater saphenous vein, medial tibia area of calf. Skin: warm and dry; annular 5 cm erythema left medial tibia with central clearing Neurologic: normal gait; grossly normal Psychological: alert and cooperative; normal mood and affect      Labs:  Results for orders placed or performed during the hospital encounter of 96/22/29  Basic metabolic panel  Result Value  Ref Range   Sodium 139 135 - 145 mmol/L   Potassium 3.4 (L) 3.5 - 5.1 mmol/L   Chloride 109 101 - 111 mmol/L   CO2 22 22 - 32 mmol/L   Glucose, Bld 99 65 - 99 mg/dL   BUN 12 6 - 20 mg/dL   Creatinine, Ser 1.06 (H) 0.44 - 1.00 mg/dL   Calcium 8.9 8.9 - 10.3 mg/dL   GFR calc non Af Amer >60 >60 mL/min   GFR calc Af Amer >60 >60 mL/min   Anion gap 8 5 - 15  CBC  Result Value Ref Range   WBC 5.5 4.0 - 10.5 K/uL   RBC 4.02 3.87 - 5.11 MIL/uL   Hemoglobin 11.9 (L) 12.0 - 15.0 g/dL   HCT 36.1 36.0 - 46.0 %   MCV 89.8 78.0 - 100.0 fL   MCH 29.6 26.0 - 34.0 pg   MCHC 33.0 30.0 - 36.0 g/dL   RDW 13.8 11.5 - 15.5 %   Platelets 245 150 - 400 K/uL  I-stat troponin, ED    Result Value Ref Range   Troponin i, poc 0.00 0.00 - 0.08 ng/mL   Comment 3            Labs Reviewed - No data to display  No results found.     ASSESSMENT & PLAN:  1. Left leg pain     Patient to go to ED for further evaluation and rule out clot  Reviewed expectations re: course of current medical issues. Questions answered. Outlined signs and symptoms indicating need for more acute intervention. Patient verbalized understanding. After Visit Summary given.      Robyn Haber, MD 12/26/16 1028

## 2016-12-26 NOTE — ED Triage Notes (Signed)
PT C/O: left leg pain   ONSET: x3 weeks  SX INCLUDE: bruising, knot  DENIES: inj/trauma, localized fever, CP, SOB, dyspnea  TAKING MEDS: none  A&O x4... NAD... Ambulatory

## 2016-12-26 NOTE — ED Notes (Signed)
Pt ambulated to room w/ steady gait.

## 2016-12-26 NOTE — Discharge Instructions (Signed)
Please read and follow all provided instructions.  Your diagnoses today include:  1. Left leg pain    Tests performed today include: Vital signs. See below for your results today.   Medications prescribed:  Take as prescribed   Home care instructions:  Follow any educational materials contained in this packet.  Follow-up instructions: Please follow-up with your primary care provider for further evaluation of symptoms and treatment   Return instructions:  Please return to the Emergency Department if you do not get better, if you get worse, or new symptoms OR  - Fever (temperature greater than 101.70F)  - Bleeding that does not stop with holding pressure to the area    -Severe pain (please note that you may be more sore the day after your accident)  - Chest Pain  - Difficulty breathing  - Severe nausea or vomiting  - Inability to tolerate food and liquids  - Passing out  - Skin becoming red around your wounds  - Change in mental status (confusion or lethargy)  - New numbness or weakness    Please return if you have any other emergent concerns.  Additional Information:  Your vital signs today were: BP 133/79 (BP Location: Right Arm)    Pulse (!) 58    Temp 97.9 F (36.6 C) (Oral)    Resp 13    Ht 5' 6.5" (1.689 m)    LMP 12/15/2016    SpO2 100%    BMI 24.96 kg/m  If your blood pressure (BP) was elevated above 135/85 this visit, please have this repeated by your doctor within one month. ---------------

## 2016-12-26 NOTE — ED Triage Notes (Signed)
Pt reports left leg pain, has tender nodule to left medical calf x 3 weeks. Sent from [facility] for evaluation of clot. Denies numbness, weakness in leg, just pain. Is irritated that she had to come here.

## 2016-12-26 NOTE — Progress Notes (Signed)
LLE venous duplex prelim: negative for DVT and SVT.  Landry Mellow, RDMS, RVT

## 2017-01-02 DIAGNOSIS — F418 Other specified anxiety disorders: Secondary | ICD-10-CM | POA: Diagnosis not present

## 2017-01-02 DIAGNOSIS — G43909 Migraine, unspecified, not intractable, without status migrainosus: Secondary | ICD-10-CM | POA: Diagnosis not present

## 2017-01-02 DIAGNOSIS — N939 Abnormal uterine and vaginal bleeding, unspecified: Secondary | ICD-10-CM | POA: Diagnosis not present

## 2017-03-06 ENCOUNTER — Ambulatory Visit: Payer: BLUE CROSS/BLUE SHIELD | Admitting: Adult Health

## 2017-03-06 ENCOUNTER — Encounter: Payer: Self-pay | Admitting: Adult Health

## 2017-03-06 VITALS — BP 132/85 | HR 55 | Wt 149.6 lb

## 2017-03-06 DIAGNOSIS — G43009 Migraine without aura, not intractable, without status migrainosus: Secondary | ICD-10-CM | POA: Diagnosis not present

## 2017-03-06 MED ORDER — TOPIRAMATE 50 MG PO TABS: ORAL_TABLET | ORAL | 5 refills | Status: DC

## 2017-03-06 NOTE — Patient Instructions (Signed)
Your Plan:  Continue Relpax for acute treatment of headache Increase topamax to 200 mg at bedtime. If your symptoms worsen or you develop new symptoms please let us know.   Thank you for coming to see [place] at [facility]. I hope we have been able to provide you high quality care today.  You may receive a patient satisfaction survey over the next few weeks. We would appreciate your feedback and comments so that we may continue to improve ourselves and the health of our patients.

## 2017-03-06 NOTE — Progress Notes (Signed)
PATIENT: [name] DOB: 14-Jan-1981  REASON FOR VISIT: follow up HISTORY FROM: patient  HISTORY OF PRESENT ILLNESS:  Today 03/06/17   [name] is a 37 year old female with a history of migraine headaches.  She returns today for follow-up.  She continues on 150 mg of Topamax at bedtime.  She reports in the last 2 weeks her headache frequency has increased.  She states fortunately she has not had a true migraine.  She reports that she has used Relpax twice in order to prevent a migraine from coming.  She states that she is working odd shifts at work.  She states therefore she is unable to establish a regular sleep routine and she feels that this is contributing to her headaches.  She reports that she is in the process of looking for another job with a set schedule.  She returns today for an evaluation.  HISTORY 10/26/16: [name] is a 37 year old right-handed black female with history of migraine headache. She is now on 150 mg of the Topamax at night, she is tolerating this well. The patient was given a trial on Maxalt but she believes that this has not been effective. The Imitrex did help her headaches but was not well-tolerated. She is having very few headaches at this time, she is only had about 3 headaches since last seen in mid June 2018. The patient is pleased with her response to the medications. She returns for an evaluation  REVIEW OF SYSTEMS: Out of a complete 14 system review of symptoms, the patient complains only of the following symptoms, and all other reviewed systems are negative.  See HPI  ALLERGIES: Allergies  Allergen Reactions  . Amoxicillin Hives and Nausea And Vomiting  . Aspirin Hives and Nausea And Vomiting  . Motrin [Ibuprofen] Hives and Nausea And Vomiting    HOME MEDICATIONS: Outpatient Medications Prior to Visit  Medication Sig Dispense Refill  . eletriptan (RELPAX) 40 MG tablet Take 1 tablet (40 mg total) by mouth 2 (two) times daily as needed for  migraine or headache. 10 tablet 3  . ondansetron (ZOFRAN) 4 MG tablet Take 1 tablet (4 mg total) by mouth every 8 (eight) hours as needed for nausea or vomiting. 30 tablet 3  . topiramate (TOPAMAX) 50 MG tablet TAKE 3 TABLETS BY MOUTH ONCE DAILY AT BEDTIME 270 tablet 1  . methocarbamol (ROBAXIN) 500 MG tablet Take 1 tablet (500 mg total) by mouth 2 (two) times daily. (Patient not taking: Reported on 03/06/2017) 20 tablet 0   No facility-administered medications prior to visit.     PAST MEDICAL HISTORY: Past Medical History:  Diagnosis Date  . Asthma   . Common migraine with intractable migraine 05/11/2016  . Hypertension   . Migraines   . PCOS (polycystic ovarian syndrome)   . Seizures ([place])    c/b anesthesia    PAST SURGICAL HISTORY: Past Surgical History:  Procedure Laterality Date  . CESAREAN SECTION     one previous  . HERNIA REPAIR    . NASAL RECONSTRUCTION      FAMILY HISTORY: Family History  Problem Relation Age of Onset  . Breast cancer Maternal Aunt   . Diabetes Maternal Grandmother   . Diabetes Paternal Grandfather   . Cancer Maternal Aunt        ovarian    SOCIAL HISTORY: Social History   Socioeconomic History  . Marital status: Married    Spouse name: Not on file  . Number of children: Not on  file  . Years of education: Not on file  . Highest education level: Not on file  Social Needs  . Financial resource strain: Not on file  . Food insecurity - worry: Not on file  . Food insecurity - inability: Not on file  . Transportation needs - medical: Not on file  . Transportation needs - non-medical: Not on file  Occupational History  . Not on file  Tobacco Use  . Smoking status: Never Smoker  . Smokeless tobacco: Never Used  Substance and Sexual Activity  . Alcohol use: Yes    Comment: occasionally  . Drug use: No  . Sexual activity: Yes    Birth control/protection: Pill  Other Topics Concern  . Not on file  Social History Narrative  . Not on file       PHYSICAL EXAM  Vitals:   03/06/17 0853  BP: 132/85  Pulse: (!) 55  Weight: 149 lb 9.6 oz (67.9 kg)   Body mass index is 23.78 kg/m.  Generalized: Well developed, in no acute distress   Neurological examination  Mentation: Alert oriented to time, place, history taking. Follows all commands speech and language fluent Cranial nerve II-XII: Pupils were equal round reactive to light. Extraocular movements were full, visual field were full on confrontational test. Facial sensation and strength were normal. Uvula tongue midline. Head turning and shoulder shrug  were normal and symmetric. Motor: The motor testing reveals 5 over 5 strength of all 4 extremities. Good symmetric motor tone is noted throughout.  Sensory: Sensory testing is intact to soft touch on all 4 extremities. No evidence of extinction is noted.  Coordination: Cerebellar testing reveals good finger-nose-finger and heel-to-shin bilaterally.  Gait and station: Gait is normal. Tandem gait is normal. Romberg is negative. No drift is seen.  Reflexes: Deep tendon reflexes are symmetric and normal bilaterally.   DIAGNOSTIC DATA (LABS, IMAGING, TESTING) - I reviewed patient records, labs, notes, testing and imaging myself where available.  Lab Results  Component Value Date   WBC 5.5 06/13/2016   HGB 11.9 (L) 06/13/2016   HCT 36.1 06/13/2016   MCV 89.8 06/13/2016   PLT 245 06/13/2016      Component Value Date/Time   NA 139 06/13/2016 0209   K 3.4 (L) 06/13/2016 0209   CL 109 06/13/2016 0209   CO2 22 06/13/2016 0209   GLUCOSE 99 06/13/2016 0209   BUN 12 06/13/2016 0209   CREATININE 1.06 (H) 06/13/2016 0209   CALCIUM 8.9 06/13/2016 0209   PROT 4.8 (L) 05/30/2010 0510   ALBUMIN 2.3 (L) 05/30/2010 0510   AST 27 05/30/2010 0510   ALT 18 05/30/2010 0510   ALKPHOS 219 (H) 05/30/2010 0510   BILITOT 0.8 05/30/2010 0510   GFRNONAA >60 06/13/2016 0209   GFRAA >60 06/13/2016 0209     ASSESSMENT AND PLAN 37 y.o.  year old female  has a past medical history of Asthma, Common migraine with intractable migraine (05/11/2016), Hypertension, Migraines, PCOS (polycystic ovarian syndrome), and Seizures ([place]). here with :  1.  Migraine headaches  The patient's headache frequency has increased in the last 2 weeks.  She will increase Topamax to 200 mg at bedtime.  I have reviewed potential side effects of Topamax with the patient.  She will continue on Relpax for acute treatment of her headaches.  She is advised that if her symptoms worsen or she develops new symptoms she should let us know.  She will follow-up in 6 months or sooner if needed.  Ward Givens, MSN, NP-C 03/06/2017, 9:09 AM [facility] [address], [place], [place] [address] [phone]

## 2017-03-06 NOTE — Progress Notes (Signed)
I have read the note, and I agree with the clinical assessment and plan.  Oluwaseyi Raffel K Boniface Goffe   

## 2017-03-09 DIAGNOSIS — J45909 Unspecified asthma, uncomplicated: Secondary | ICD-10-CM | POA: Diagnosis not present

## 2017-03-09 DIAGNOSIS — J0110 Acute frontal sinusitis, unspecified: Secondary | ICD-10-CM | POA: Diagnosis not present

## 2017-04-19 ENCOUNTER — Ambulatory Visit: Payer: BLUE CROSS/BLUE SHIELD | Admitting: Adult Health

## 2017-04-19 ENCOUNTER — Encounter: Payer: Self-pay | Admitting: Adult Health

## 2017-04-19 VITALS — BP 131/81 | HR 81 | Ht 66.5 in | Wt 147.4 lb

## 2017-04-19 DIAGNOSIS — G43011 Migraine without aura, intractable, with status migrainosus: Secondary | ICD-10-CM | POA: Diagnosis not present

## 2017-04-19 MED ORDER — PREDNISONE 5 MG PO TABS: ORAL_TABLET | ORAL | 0 refills | Status: DC

## 2017-04-19 NOTE — Progress Notes (Signed)
PATIENT: [name] DOB: [birth year]  REASON FOR VISIT: follow up HISTORY FROM: patient  HISTORY OF PRESENT ILLNESS: Today 04/19/17 [name]  is a 37 year old female with a history of migraine.  She is currently taking 200 mg at bedtime.  She uses Relpax to treat her acute migraines.  She reports that she has had a ongoing headache for the last 3 weeks.  She reports that her pain fluctuates from a 4 out of 10 to a 7 out of 10.  She reports that the headache has been constant.  She has used Tylenol as well as Relpax.  She reports that these medications has decreased the severity but essentially did not resolve the headache.  She reports the headache usually starts in the center of the head and radiates down to the forehead.  She reports with severe headaches she will have photophobia.  She reports occasionally she will have halos in her vision.  She confirmed that she is also had nausea and vomiting.  She has been using Zofran.  In the past she has had a prednisone Dosepak for that it works well for her headache.  She returns today for evaluation.  HISTORY 03/06/17   [name] is a 37 year old female with a history of migraine headaches.  She returns today for follow-up.  She continues on 150 mg of Topamax at bedtime.  She reports in the last 2 weeks her headache frequency has increased.  She states fortunately she has not had a true migraine.  She reports that she has used Relpax twice in order to prevent a migraine from coming.  She states that she is working odd shifts at work.  She states therefore she is unable to establish a regular sleep routine and she feels that this is contributing to her headaches.  She reports that she is in the process of looking for another job with a set schedule.  She returns today for an evaluation.   REVIEW OF SYSTEMS: Out of a complete 14 system review of symptoms, the patient complains only of the following symptoms, and all other reviewed systems are  negative.  See HPI   ALLERGIES: Allergies  Allergen Reactions  . Amoxicillin Hives and Nausea And Vomiting  . Aspirin Hives and Nausea And Vomiting  . Motrin [Ibuprofen] Hives and Nausea And Vomiting    HOME MEDICATIONS: Outpatient Medications Prior to Visit  Medication Sig Dispense Refill  . eletriptan (RELPAX) 40 MG tablet Take 1 tablet (40 mg total) by mouth 2 (two) times daily as needed for migraine or headache. 10 tablet 3  . ondansetron (ZOFRAN) 4 MG tablet Take 1 tablet (4 mg total) by mouth every 8 (eight) hours as needed for nausea or vomiting. 30 tablet 3  . topiramate (TOPAMAX) 50 MG tablet TAKE 4 TABLETS BY MOUTH ONCE DAILY AT BEDTIME 120 tablet 5  . valACYclovir (VALTREX) 500 MG tablet as needed. For exacerbation  4   No facility-administered medications prior to visit.     PAST MEDICAL HISTORY: Past Medical History:  Diagnosis Date  . Asthma   . Common migraine with intractable migraine 05/11/2016  . Hypertension   . Migraines   . PCOS (polycystic ovarian syndrome)   . Seizures ([place])    c/b anesthesia    PAST SURGICAL HISTORY: Past Surgical History:  Procedure Laterality Date  . CESAREAN SECTION     one previous  . HERNIA REPAIR    . NASAL RECONSTRUCTION      FAMILY  HISTORY: Family History  Problem Relation Age of Onset  . Breast cancer Maternal Aunt   . Diabetes Maternal Grandmother   . Diabetes Paternal Grandfather   . Cancer Maternal Aunt        ovarian    SOCIAL HISTORY: Social History   Socioeconomic History  . Marital status: Married    Spouse name: Not on file  . Number of children: Not on file  . Years of education: Not on file  . Highest education level: Not on file  Social Needs  . Financial resource strain: Not on file  . Food insecurity - worry: Not on file  . Food insecurity - inability: Not on file  . Transportation needs - medical: Not on file  . Transportation needs - non-medical: Not on file  Occupational History  .  Not on file  Tobacco Use  . Smoking status: Never Smoker  . Smokeless tobacco: Never Used  Substance and Sexual Activity  . Alcohol use: Yes    Comment: occasionally  . Drug use: No  . Sexual activity: Yes    Birth control/protection: Pill  Other Topics Concern  . Not on file  Social History Narrative  . Not on file      PHYSICAL EXAM  Vitals:   04/19/17 0803  BP: 131/81  Pulse: 81  Weight: 147 lb 6.4 oz (66.9 kg)  Height: 5' 6.5" (1.689 m)   Body mass index is 23.43 kg/m.  Generalized: Well developed, in no acute distress   Neurological examination  Mentation: Alert oriented to time, place, history taking. Follows all commands speech and language fluent Cranial nerve II-XII: Pupils were equal round reactive to light. Extraocular movements were full, visual field were full on confrontational test. Facial sensation and strength were normal. Uvula tongue midline. Head turning and shoulder shrug  were normal and symmetric. Motor: The motor testing reveals 5 over 5 strength of all 4 extremities. Good symmetric motor tone is noted throughout.  Sensory: Sensory testing is intact to soft touch on all 4 extremities. No evidence of extinction is noted.  Coordination: Cerebellar testing reveals good finger-nose-finger and heel-to-shin bilaterally.  Gait and station: Gait is normal. Tandem gait is normal. Romberg is negative. No drift is seen.  Reflexes: Deep tendon reflexes are symmetric and normal bilaterally.   DIAGNOSTIC DATA (LABS, IMAGING, TESTING) - I reviewed patient records, labs, notes, testing and imaging myself where available.  Lab Results  Component Value Date   WBC 5.5 06/13/2016   HGB 11.9 (L) 06/13/2016   HCT 36.1 06/13/2016   MCV 89.8 06/13/2016   PLT 245 06/13/2016      Component Value Date/Time   NA 139 06/13/2016 0209   K 3.4 (L) 06/13/2016 0209   CL 109 06/13/2016 0209   CO2 22 06/13/2016 0209   GLUCOSE 99 06/13/2016 0209   BUN 12 06/13/2016 0209     CREATININE 1.06 (H) 06/13/2016 0209   CALCIUM 8.9 06/13/2016 0209   PROT 4.8 (L) 05/30/2010 0510   ALBUMIN 2.3 (L) 05/30/2010 0510   AST 27 05/30/2010 0510   ALT 18 05/30/2010 0510   ALKPHOS 219 (H) 05/30/2010 0510   BILITOT 0.8 05/30/2010 0510   GFRNONAA >60 06/13/2016 0209   GFRAA >60 06/13/2016 0209      ASSESSMENT AND PLAN 37 y.o. year old female  has a past medical history of Asthma, Common migraine with intractable migraine (05/11/2016), Hypertension, Migraines, PCOS (polycystic ovarian syndrome), and Seizures ([place]). here with:  1.  Migraine headache  The patient has had a constant headache for the last 3 weeks.  She will continue on Topamax 200 mg at bedtime.  I will give her a prednisone Dosepak to hopefully break the cycle of her headaches.  I have reviewed potential side effects of prednisone with the patient.  She will continue to use Relpax to treat her acute headaches.  I advised that if her symptoms worsen or she develops new symptoms she should let us know.  She will follow-up in 6 months or sooner if needed.      Ward Givens, MSN, NP-C 04/19/2017, 8:18 AM [facility] [address] [place], [place] [address] [phone]

## 2017-04-19 NOTE — Progress Notes (Signed)
I have read the note, and I agree with the clinical assessment and plan.  Sante Biedermann K Jonah Gingras   

## 2017-04-19 NOTE — Patient Instructions (Signed)
Your Plan:  Continue Topamax Continue Relpax Prednisone dosepak ordered If your symptoms worsen or you develop new symptoms please let us know.    Thank you for coming to see [place] at [facility]. I hope we have been able to provide you high quality care today.  You may receive a patient satisfaction survey over the next few weeks. We would appreciate your feedback and comments so that we may continue to improve ourselves and the health of our patients.

## 2017-05-02 DIAGNOSIS — Z01419 Encounter for gynecological examination (general) (routine) without abnormal findings: Secondary | ICD-10-CM | POA: Diagnosis not present

## 2017-06-24 ENCOUNTER — Emergency Department (HOSPITAL_COMMUNITY)
Admission: EM | Admit: 2017-06-24 | Discharge: 2017-06-24 | Disposition: A | Discharge status: Home / Self Care | Payer: BLUE CROSS/BLUE SHIELD | Attending: Emergency Medicine | Admitting: Emergency Medicine

## 2017-06-24 ENCOUNTER — Emergency Department (HOSPITAL_COMMUNITY): Payer: BLUE CROSS/BLUE SHIELD

## 2017-06-24 ENCOUNTER — Encounter (HOSPITAL_COMMUNITY): Payer: Self-pay | Admitting: Emergency Medicine

## 2017-06-24 ENCOUNTER — Other Ambulatory Visit: Payer: Self-pay

## 2017-06-24 DIAGNOSIS — S199XXA Unspecified injury of neck, initial encounter: Secondary | ICD-10-CM | POA: Diagnosis not present

## 2017-06-24 DIAGNOSIS — Z79899 Other long term (current) drug therapy: Secondary | ICD-10-CM | POA: Insufficient documentation

## 2017-06-24 DIAGNOSIS — I10 Essential (primary) hypertension: Secondary | ICD-10-CM | POA: Insufficient documentation

## 2017-06-24 DIAGNOSIS — M5489 Other dorsalgia: Secondary | ICD-10-CM | POA: Diagnosis not present

## 2017-06-24 DIAGNOSIS — S299XXA Unspecified injury of thorax, initial encounter: Secondary | ICD-10-CM | POA: Diagnosis not present

## 2017-06-24 DIAGNOSIS — M545 Low back pain: Secondary | ICD-10-CM | POA: Diagnosis not present

## 2017-06-24 DIAGNOSIS — M25511 Pain in right shoulder: Secondary | ICD-10-CM

## 2017-06-24 DIAGNOSIS — J45909 Unspecified asthma, uncomplicated: Secondary | ICD-10-CM | POA: Insufficient documentation

## 2017-06-24 DIAGNOSIS — M546 Pain in thoracic spine: Secondary | ICD-10-CM | POA: Diagnosis not present

## 2017-06-24 DIAGNOSIS — M791 Myalgia, unspecified site: Secondary | ICD-10-CM | POA: Diagnosis not present

## 2017-06-24 DIAGNOSIS — M542 Cervicalgia: Secondary | ICD-10-CM | POA: Diagnosis not present

## 2017-06-24 DIAGNOSIS — T148XXA Other injury of unspecified body region, initial encounter: Secondary | ICD-10-CM | POA: Diagnosis not present

## 2017-06-24 LAB — POC URINE PREG, ED: PREG TEST UR: NEGATIVE

## 2017-06-24 MED ORDER — ACETAMINOPHEN 500 MG PO TABS
1000.0000 mg | ORAL_TABLET | Freq: Once | ORAL | Status: AC
  Administered 2017-06-24: 1000 mg via ORAL
  Filled 2017-06-24: qty 2

## 2017-06-24 MED ORDER — METHOCARBAMOL 500 MG PO TABS: 500.0000 mg | ORAL_TABLET | Freq: Two times a day (BID) | ORAL | 0 refills | Status: DC | PRN

## 2017-06-24 MED ORDER — METHOCARBAMOL 500 MG PO TABS
500.0000 mg | ORAL_TABLET | Freq: Once | ORAL | Status: AC
  Administered 2017-06-24: 500 mg via ORAL
  Filled 2017-06-24: qty 1
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
## 2017-06-24 NOTE — ED Notes (Signed)
Patient states unable to provide specimen at this time.
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
## 2017-07-16 DIAGNOSIS — H5203 Hypermetropia, bilateral: Secondary | ICD-10-CM | POA: Diagnosis not present

## 2017-07-29 ENCOUNTER — Encounter (HOSPITAL_COMMUNITY): Payer: Self-pay

## 2017-07-29 ENCOUNTER — Emergency Department (HOSPITAL_COMMUNITY)
Admission: EM | Admit: 2017-07-29 | Discharge: 2017-07-29 | Disposition: A | Discharge status: Home / Self Care | Payer: BLUE CROSS/BLUE SHIELD | Attending: Emergency Medicine | Admitting: Emergency Medicine

## 2017-07-29 DIAGNOSIS — Z79899 Other long term (current) drug therapy: Secondary | ICD-10-CM | POA: Diagnosis not present

## 2017-07-29 DIAGNOSIS — R102 Pelvic and perineal pain: Secondary | ICD-10-CM | POA: Diagnosis not present

## 2017-07-29 DIAGNOSIS — J45909 Unspecified asthma, uncomplicated: Secondary | ICD-10-CM | POA: Insufficient documentation

## 2017-07-29 DIAGNOSIS — R42 Dizziness and giddiness: Secondary | ICD-10-CM | POA: Diagnosis not present

## 2017-07-29 DIAGNOSIS — N939 Abnormal uterine and vaginal bleeding, unspecified: Secondary | ICD-10-CM | POA: Diagnosis not present

## 2017-07-29 DIAGNOSIS — I10 Essential (primary) hypertension: Secondary | ICD-10-CM | POA: Diagnosis not present

## 2017-07-29 LAB — CBC WITH DIFFERENTIAL/PLATELET
ABS IMMATURE GRANULOCYTES: 0 10*3/uL (ref 0.0–0.1)
BASOS PCT: 1 %
Basophils Absolute: 0.1 10*3/uL (ref 0.0–0.1)
Eosinophils Absolute: 0.2 10*3/uL (ref 0.0–0.7)
Eosinophils Relative: 4 %
HCT: 39.4 % (ref 36.0–46.0)
Hemoglobin: 12.4 g/dL (ref 12.0–15.0)
IMMATURE GRANULOCYTES: 0 %
Lymphocytes Relative: 33 %
Lymphs Abs: 1.4 10*3/uL (ref 0.7–4.0)
MCH: 29.5 pg (ref 26.0–34.0)
MCHC: 31.5 g/dL (ref 30.0–36.0)
MCV: 93.8 fL (ref 78.0–100.0)
MONOS PCT: 5 %
Monocytes Absolute: 0.2 10*3/uL (ref 0.1–1.0)
NEUTROS ABS: 2.4 10*3/uL (ref 1.7–7.7)
NEUTROS PCT: 57 %
Platelets: 221 10*3/uL (ref 150–400)
RBC: 4.2 MIL/uL (ref 3.87–5.11)
RDW: 13.2 % (ref 11.5–15.5)
WBC: 4.2 10*3/uL (ref 4.0–10.5)

## 2017-07-29 LAB — I-STAT BETA HCG BLOOD, ED (MC, WL, AP ONLY): I-stat hCG, quantitative: <5 m[IU]/mL (ref <5)

## 2017-07-29 MED ORDER — OXYCODONE-ACETAMINOPHEN 5-325 MG PO TABS: 1.0000 | ORAL_TABLET | Freq: Three times a day (TID) | ORAL | 0 refills | Status: DC | PRN

## 2017-07-29 MED ORDER — OXYCODONE-ACETAMINOPHEN 5-325 MG PO TABS
1.0000 | ORAL_TABLET | Freq: Once | ORAL | Status: AC
  Administered 2017-07-29: 1 via ORAL
  Filled 2017-07-29: qty 1

## 2017-07-29 NOTE — ED Notes (Signed)
E-sig not working

## 2017-07-29 NOTE — ED Notes (Signed)
D/c reviewed with patient and spouse 

## 2017-07-29 NOTE — ED Triage Notes (Signed)
Pt. Reports her period starting yesterday and states that it heavier than normal. Pt. Reports going through 3 pads yesterday and 1 overnight. Pt. Reports abdominal cramps 6/10. A/0 X4

## 2017-07-29 NOTE — ED Provider Notes (Signed)
[facility] EMERGENCY DEPARTMENT Provider Note   CSN: [id number] Arrival date & time: 07/29/17  0962     History   Chief Complaint No chief complaint on file.   HPI [name] is a 37 y.o. female.  HPI Patient presents with vaginal bleeding and cramping.  She used 3 pads yesterday and 1 last night.  States she is been passing some cramps.  Has abdominal cramping with it.  Denies possibility of pregnancy.  Has a history of polycystic ovarian syndrome.  Has somewhat irregular periods in terms of flow but usually does not this heavy.  No fevers.  No vaginal discharge.  No nausea vomiting diarrhea.  Pain is crampy in her lower abdomen particularly on the right side.  No other bleeding.  Her menses is around 1 week early.  For the last 3 months she states she has been more irregular in terms of her timing. Past Medical History:  Diagnosis Date  . Asthma   . Common migraine with intractable migraine 05/11/2016  . Hypertension   . Migraines   . PCOS (polycystic ovarian syndrome)   . Seizures ([place])    c/b anesthesia    Patient Active Problem List   Diagnosis Date Noted  . Common migraine with intractable migraine 05/11/2016  . Axillary lump 06/25/2012    Past Surgical History:  Procedure Laterality Date  . CESAREAN SECTION     one previous  . HERNIA REPAIR    . NASAL RECONSTRUCTION       OB History    Gravida  1   Para  1   Term  1   Preterm      AB      Living  1     SAB      TAB      Ectopic      Multiple      Live Births               Home Medications    Prior to Admission medications   Medication Sig Start Date End Date Taking? Authorizing Provider  Acetaminophen-Caffeine (EXCEDRIN TENSION HEADACHE) 500-65 MG TABS Take 2 tablets by mouth every 6 (six) hours as needed (headache).    [provider]  eletriptan (RELPAX) 40 MG tablet Take 1 tablet (40 mg total) by mouth 2 (two) times daily as needed for migraine or  headache. 10/26/16   Kathrynn Ducking, MD  methocarbamol (ROBAXIN) 500 MG tablet Take 1 tablet (500 mg total) by mouth 2 (two) times daily as needed. 06/24/17   Ward, Ozella Almond, PA-C  ondansetron (ZOFRAN) 4 MG tablet Take 1 tablet (4 mg total) by mouth every 8 (eight) hours as needed for nausea or vomiting. 05/11/16   Kathrynn Ducking, MD  oxyCODONE-acetaminophen (PERCOCET/ROXICET) 5-325 MG tablet Take 1 tablet by mouth every 8 (eight) hours as needed. 07/29/17   Davonna Belling, MD  topiramate (TOPAMAX) 50 MG tablet TAKE 4 TABLETS BY MOUTH ONCE DAILY AT BEDTIME 03/06/17   Ward Givens, NP  valACYclovir (VALTREX) 500 MG tablet Take 500 mg by mouth as needed (exacerbation).  04/11/17   [provider]    Family History Family History  Problem Relation Age of Onset  . Breast cancer Maternal Aunt   . Diabetes Maternal Grandmother   . Diabetes Paternal Grandfather   . Cancer Maternal Aunt        ovarian    Social History Social History   Tobacco Use  .  Smoking status: Never Smoker  . Smokeless tobacco: Never Used  Substance Use Topics  . Alcohol use: Yes    Comment: occasionally  . Drug use: No     Allergies   Amoxicillin; Aspirin; and Motrin [ibuprofen]   Review of Systems Review of Systems  Constitutional: Negative for appetite change and fever.  HENT: Negative for congestion.   Respiratory: Negative for shortness of breath.   Gastrointestinal: Positive for abdominal pain.  Genitourinary: Positive for pelvic pain and vaginal bleeding. Negative for flank pain.  Musculoskeletal: Negative for back pain.  Skin: Negative for rash.  Neurological: Positive for light-headedness.  Hematological: Does not bruise/bleed easily.     Physical Exam Updated Vital Signs BP 116/69   Pulse (!) 52   Temp 98.2 F (36.8 C) (Oral)   Ht 5\' 6"  (1.676 m)   Wt 65.8 kg (145 lb)   SpO2 100%   BMI 23.40 kg/m   Physical Exam  Constitutional: She appears well-developed.    HENT:  Head: Normocephalic.  Neck: Neck supple.  Cardiovascular: Normal rate.  Pulmonary/Chest: Effort normal.  Abdominal: There is tenderness.  Right lower quadrant tenderness without rebound or guarding.  Genitourinary:  Genitourinary Comments: Cervical loss is closed.  Blood in the vagina.  Unable to visualize cervix but no cervical tenderness.  Mild right-sided adnexal tenderness.  Neurological: She is alert.  Skin: Skin is warm. Capillary refill takes less than 2 seconds.     ED Treatments / Results  Labs (all labs ordered are listed, but only abnormal results are displayed) Labs Reviewed  CBC WITH DIFFERENTIAL/PLATELET  I-STAT BETA HCG BLOOD, ED ([facility], [facility], [facility] ONLY)    EKG None  Radiology No results found.  Procedures Procedures (including critical care time)  Medications Ordered in ED Medications  oxyCODONE-acetaminophen (PERCOCET/ROXICET) 5-325 MG per tablet 1 tablet (1 tablet Oral Given 07/29/17 0807)     Initial Impression / Assessment and Plan / ED Course  I have reviewed the triage vital signs and the nursing notes.  Pertinent labs & imaging results that were available during my care of the patient were reviewed by me and considered in my medical decision making (see chart for details).     Patient with heavy vaginal bleeding.  Does have blood in the vagina but hemoglobin normal.  Well-appearing.  Mild tenderness.  Cannot do NSAIDs due to previous allergy.  Discharge home.  Potential ovarian cyst could be causing some of the pain also.  Doubt torsion.  Final Clinical Impressions(s) / ED Diagnoses   Final diagnoses:  Episode of heavy vaginal bleeding    ED Discharge Orders        Ordered    oxyCODONE-acetaminophen (PERCOCET/ROXICET) 5-325 MG tablet  Every 8 hours PRN     07/29/17 0825       Davonna Belling, MD 07/29/17 [phone]

## 2017-09-04 ENCOUNTER — Ambulatory Visit: Payer: BLUE CROSS/BLUE SHIELD | Admitting: Adult Health

## 2017-09-27 DIAGNOSIS — G43109 Migraine with aura, not intractable, without status migrainosus: Secondary | ICD-10-CM | POA: Diagnosis not present

## 2017-09-27 DIAGNOSIS — N760 Acute vaginitis: Secondary | ICD-10-CM | POA: Diagnosis not present

## 2017-09-27 DIAGNOSIS — Z3009 Encounter for other general counseling and advice on contraception: Secondary | ICD-10-CM | POA: Diagnosis not present

## 2017-09-27 DIAGNOSIS — N939 Abnormal uterine and vaginal bleeding, unspecified: Secondary | ICD-10-CM | POA: Diagnosis not present

## 2017-10-23 DIAGNOSIS — Z30430 Encounter for insertion of intrauterine contraceptive device: Secondary | ICD-10-CM | POA: Diagnosis not present

## 2017-10-23 DIAGNOSIS — Z3202 Encounter for pregnancy test, result negative: Secondary | ICD-10-CM | POA: Diagnosis not present

## 2017-10-29 ENCOUNTER — Ambulatory Visit: Payer: BLUE CROSS/BLUE SHIELD | Admitting: Adult Health

## 2017-10-29 ENCOUNTER — Encounter: Payer: Self-pay | Admitting: Adult Health

## 2017-10-29 VITALS — BP 124/78 | HR 56 | Ht 66.5 in | Wt 145.6 lb

## 2017-10-29 DIAGNOSIS — G43009 Migraine without aura, not intractable, without status migrainosus: Secondary | ICD-10-CM

## 2017-10-29 DIAGNOSIS — K5900 Constipation, unspecified: Secondary | ICD-10-CM | POA: Diagnosis not present

## 2017-10-29 DIAGNOSIS — Z0000 Encounter for general adult medical examination without abnormal findings: Secondary | ICD-10-CM | POA: Diagnosis not present

## 2017-10-29 DIAGNOSIS — Z136 Encounter for screening for cardiovascular disorders: Secondary | ICD-10-CM | POA: Diagnosis not present

## 2017-10-29 MED ORDER — GABAPENTIN 100 MG PO CAPS: 100.0000 mg | ORAL_CAPSULE | Freq: Every day | ORAL | 5 refills | Status: DC

## 2017-10-29 NOTE — Progress Notes (Signed)
I have read the note, and I agree with the clinical assessment and plan.  Jaleeah Slight K Lakrista Scaduto   

## 2017-10-29 NOTE — Patient Instructions (Signed)
Your Plan:  Continue Topamax Start gabapentin 100 mg at bedtime If your symptoms worsen or you develop new symptoms please let us know.   Thank you for coming to see [place] at [facility]. I hope we have been able to provide you high quality care today.  You may receive a patient satisfaction survey over the next few weeks. We would appreciate your feedback and comments so that we may continue to improve ourselves and the health of our patients.  Gabapentin capsules or tablets What is this medicine? GABAPENTIN (GA ba pen tin) is used to control partial seizures in adults with epilepsy. It is also used to treat certain types of nerve pain. This medicine may be used for other purposes; ask your health care provider or pharmacist if you have questions. COMMON BRAND NAME(S): Active-PAC with Gabapentin, Gabarone, Neurontin What should I tell my health care provider before I take this medicine? They need to know if you have any of these conditions: -kidney disease -suicidal thoughts, plans, or attempt; a previous suicide attempt by you or a family member -an unusual or allergic reaction to gabapentin, other medicines, foods, dyes, or preservatives -pregnant or trying to get pregnant -breast-feeding How should I use this medicine? Take this medicine by mouth with a glass of water. Follow the directions on the prescription label. You can take it with or without food. If it upsets your stomach, take it with food.Take your medicine at regular intervals. Do not take it more often than directed. Do not stop taking except on your doctor's advice. If you are directed to break the 600 or 800 mg tablets in half as part of your dose, the extra half tablet should be used for the next dose. If you have not used the extra half tablet within 28 days, it should be thrown away. A special MedGuide will be given to you by the pharmacist with each prescription and refill. Be sure to read this  information carefully each time. Talk to your pediatrician regarding the use of this medicine in children. Special care may be needed. Overdosage: If you think you have taken too much of this medicine contact a poison control center or emergency room at once. NOTE: This medicine is only for you. Do not share this medicine with others. What if I miss a dose? If you miss a dose, take it as soon as you can. If it is almost time for your next dose, take only that dose. Do not take double or extra doses. What may interact with this medicine? Do not take this medicine with any of the following medications: -other gabapentin products This medicine may also interact with the following medications: -alcohol -antacids -antihistamines for allergy, cough and cold -certain medicines for anxiety or sleep -certain medicines for depression or psychotic disturbances -homatropine; hydrocodone -naproxen -narcotic medicines (opiates) for pain -phenothiazines like chlorpromazine, mesoridazine, prochlorperazine, thioridazine This list may not describe all possible interactions. Give your health care provider a list of all the medicines, herbs, non-prescription drugs, or dietary supplements you use. Also tell them if you smoke, drink alcohol, or use illegal drugs. Some items may interact with your medicine. What should I watch for while using this medicine? Visit your doctor or health care professional for regular checks on your progress. You may want to keep a record at home of how you feel your condition is responding to treatment. You may want to share this information with your doctor or health care professional at each  visit. You should contact your doctor or health care professional if your seizures get worse or if you have any new types of seizures. Do not stop taking this medicine or any of your seizure medicines unless instructed by your doctor or health care professional. Stopping your medicine suddenly can  increase your seizures or their severity. Wear a medical identification bracelet or chain if you are taking this medicine for seizures, and carry a card that lists all your medications. You may get drowsy, dizzy, or have blurred vision. Do not drive, use machinery, or do anything that needs mental alertness until you know how this medicine affects you. To reduce dizzy or fainting spells, do not sit or stand up quickly, especially if you are an older patient. Alcohol can increase drowsiness and dizziness. Avoid alcoholic drinks. Your mouth may get dry. Chewing sugarless gum or sucking hard candy, and drinking plenty of water will help. The use of this medicine may increase the chance of suicidal thoughts or actions. Pay special attention to how you are responding while on this medicine. Any worsening of mood, or thoughts of suicide or dying should be reported to your health care professional right away. Women who become pregnant while using this medicine may enroll in the [place] Pregnancy Registry by calling [phone]. This registry collects information about the safety of antiepileptic drug use during pregnancy. What side effects may I notice from receiving this medicine? Side effects that you should report to your doctor or health care professional as soon as possible: -allergic reactions like skin rash, itching or hives, swelling of the face, lips, or tongue -worsening of mood, thoughts or actions of suicide or dying Side effects that usually do not require medical attention (report to your doctor or health care professional if they continue or are bothersome): -constipation -difficulty walking or controlling muscle movements -dizziness -nausea -slurred speech -tiredness -tremors -weight gain This list may not describe all possible side effects. Call your doctor for medical advice about side effects. You may report side effects to FDA at 1-800-FDA-1088. Where  should I keep my medicine? Keep out of reach of children. This medicine may cause accidental overdose and death if it taken by other adults, children, or pets. Mix any unused medicine with a substance like cat litter or coffee grounds. Then throw the medicine away in a sealed container like a sealed bag or a coffee can with a lid. Do not use the medicine after the expiration date. Store at room temperature between 15 and 30 degrees C (59 and 86 degrees F). NOTE: This sheet is a summary. It may not cover all possible information. If you have questions about this medicine, talk to your doctor, pharmacist, or health care provider.  2018 Elsevier/Gold Standard (2013-03-21 15:26:50)

## 2017-10-29 NOTE — Progress Notes (Signed)
PATIENT: [name] DOB: July 24, 1980  REASON FOR VISIT: follow up HISTORY FROM: patient  HISTORY OF PRESENT ILLNESS: Today 10/29/17:  [name] is a 37 year old female with a history of migraine headaches.  She returns today for follow-up.  She states overall her migraine headaches are better.  She has not had a migraine in the last month.  She states that she is not headaches at least 3 times a week.  She typically can take Tylenol or Excedrin tension.  She reports that she does not use Relpax as much because she needs to lay down after she takes it.  She states that she works different shifts at her job which also may be affecting her sleep.   HISTORY 04/19/17 [name]  is a 38 year old female with a history of migraine.  She is currently taking 200 mg at bedtime.  She uses Relpax to treat her acute migraines.  She reports that she has had a ongoing headache for the last 3 weeks.  She reports that her pain fluctuates from a 4 out of 10 to a 7 out of 10.  She reports that the headache has been constant.  She has used Tylenol as well as Relpax.  She reports that these medications has decreased the severity but essentially did not resolve the headache.  She reports the headache usually starts in the center of the head and radiates down to the forehead.  She reports with severe headaches she will have photophobia.  She reports occasionally she will have halos in her vision.  She confirmed that she is also had nausea and vomiting.  She has been using Zofran.  In the past she has had a prednisone Dosepak for that it works well for her headache.  She returns today for evaluation.   REVIEW OF SYSTEMS: Out of a complete 14 system review of symptoms, the patient complains only of the following symptoms, and all other reviewed systems are negative.  See HPI  ALLERGIES: Allergies  Allergen Reactions  . Amoxicillin Hives and Nausea And Vomiting    Has patient had a PCN reaction causing  immediate rash, facial/tongue/throat swelling, SOB or lightheadedness with hypotension: Yes Has patient had a PCN reaction causing severe rash involving mucus membranes or skin necrosis: No Has patient had a PCN reaction that required hospitalization: No Has patient had a PCN reaction occurring within the last 10 years: No If all of the above answers are "NO", then may proceed with Cephalosporin use.   . Aspirin Hives and Nausea And Vomiting  . Motrin [Ibuprofen] Hives and Nausea And Vomiting    HOME MEDICATIONS: Outpatient Medications Prior to Visit  Medication Sig Dispense Refill  . Acetaminophen-Caffeine (EXCEDRIN TENSION HEADACHE) 500-65 MG TABS Take 2 tablets by mouth every 6 (six) hours as needed (headache).    . eletriptan (RELPAX) 40 MG tablet Take 1 tablet (40 mg total) by mouth 2 (two) times daily as needed for migraine or headache. 10 tablet 3  . levonorgestrel (MIRENA) 20 MCG/24HR IUD 1 each by Intrauterine route once.    . ondansetron (ZOFRAN) 4 MG tablet Take 1 tablet (4 mg total) by mouth every 8 (eight) hours as needed for nausea or vomiting. 30 tablet 3  . topiramate (TOPAMAX) 50 MG tablet TAKE 4 TABLETS BY MOUTH ONCE DAILY AT BEDTIME 120 tablet 5  . valACYclovir (VALTREX) 500 MG tablet Take 500 mg by mouth as needed (exacerbation).   4  . methocarbamol (ROBAXIN) 500 MG tablet Take  1 tablet (500 mg total) by mouth 2 (two) times daily as needed. (Patient not taking: Reported on 10/29/2017) 20 tablet 0  . oxyCODONE-acetaminophen (PERCOCET/ROXICET) 5-325 MG tablet Take 1 tablet by mouth every 8 (eight) hours as needed. (Patient not taking: Reported on 10/29/2017) 3 tablet 0   No facility-administered medications prior to visit.     PAST MEDICAL HISTORY: Past Medical History:  Diagnosis Date  . Asthma   . Common migraine with intractable migraine 05/11/2016  . Hypertension   . Migraines   . PCOS (polycystic ovarian syndrome)   . Seizures ([place])    c/b anesthesia    PAST  SURGICAL HISTORY: Past Surgical History:  Procedure Laterality Date  . CESAREAN SECTION     one previous  . HERNIA REPAIR    . NASAL RECONSTRUCTION      FAMILY HISTORY: Family History  Problem Relation Age of Onset  . Breast cancer Maternal Aunt   . Diabetes Maternal Grandmother   . Diabetes Paternal Grandfather   . Cancer Maternal Aunt        ovarian    SOCIAL HISTORY: Social History   Socioeconomic History  . Marital status: Married    Spouse name: Not on file  . Number of children: Not on file  . Years of education: Not on file  . Highest education level: Not on file  Occupational History  . Not on file  Social Needs  . Financial resource strain: Not on file  . Food insecurity:    Worry: Not on file    Inability: Not on file  . Transportation needs:    Medical: Not on file    Non-medical: Not on file  Tobacco Use  . Smoking status: Never Smoker  . Smokeless tobacco: Never Used  Substance and Sexual Activity  . Alcohol use: Yes    Comment: occasionally  . Drug use: No  . Sexual activity: Yes    Birth control/protection: Pill  Lifestyle  . Physical activity:    Days per week: Not on file    Minutes per session: Not on file  . Stress: Not on file  Relationships  . Social connections:    Talks on phone: Not on file    Gets together: Not on file    Attends religious service: Not on file    Active member of club or organization: Not on file    Attends meetings of clubs or organizations: Not on file    Relationship status: Not on file  . Intimate partner violence:    Fear of current or ex partner: Not on file    Emotionally abused: Not on file    Physically abused: Not on file    Forced sexual activity: Not on file  Other Topics Concern  . Not on file  Social History Narrative  . Not on file      PHYSICAL EXAM  Vitals:   10/29/17 0917  BP: 124/78  Pulse: (!) 56  Weight: 145 lb 9.6 oz (66 kg)  Height: 5' 6.5" (1.689 m)   Body mass index is  23.15 kg/m.  Generalized: Well developed, in no acute distress   Neurological examination  Mentation: Alert oriented to time, place, history taking. Follows all commands speech and language fluent Cranial nerve II-XII: Pupils were equal round reactive to light. Extraocular movements were full, visual field were full on confrontational test. Facial sensation and strength were normal. Uvula tongue midline. Head turning and shoulder shrug  were normal and  symmetric. Motor: The motor testing reveals 5 over 5 strength of all 4 extremities. Good symmetric motor tone is noted throughout.  Sensory: Sensory testing is intact to soft touch on all 4 extremities. No evidence of extinction is noted.  Coordination: Cerebellar testing reveals good finger-nose-finger and heel-to-shin bilaterally.  Gait and station: Gait is normal..  Reflexes: Deep tendon reflexes are symmetric and normal bilaterally.   DIAGNOSTIC DATA (LABS, IMAGING, TESTING) - I reviewed patient records, labs, notes, testing and imaging myself where available.  Lab Results  Component Value Date   WBC 4.2 07/29/2017   HGB 12.4 07/29/2017   HCT 39.4 07/29/2017   MCV 93.8 07/29/2017   PLT 221 07/29/2017      Component Value Date/Time   NA 139 06/13/2016 0209   K 3.4 (L) 06/13/2016 0209   CL 109 06/13/2016 0209   CO2 22 06/13/2016 0209   GLUCOSE 99 06/13/2016 0209   BUN 12 06/13/2016 0209   CREATININE 1.06 (H) 06/13/2016 0209   CALCIUM 8.9 06/13/2016 0209   PROT 4.8 (L) 05/30/2010 0510   ALBUMIN 2.3 (L) 05/30/2010 0510   AST 27 05/30/2010 0510   ALT 18 05/30/2010 0510   ALKPHOS 219 (H) 05/30/2010 0510   BILITOT 0.8 05/30/2010 0510   GFRNONAA >60 06/13/2016 0209   GFRAA >60 06/13/2016 0209      ASSESSMENT AND PLAN 37 y.o. year old female  has a past medical history of Asthma, Common migraine with intractable migraine (05/11/2016), Hypertension, Migraines, PCOS (polycystic ovarian syndrome), and Seizures ([place]). here  with:  1.  Migraine headaches  The patient continues to have 3 headaches a week.  She has not had a migraine headache in over a month.  She would like to try an additional medication to see if it helps with her headaches.  She will continue on Topamax.  She will start gabapentin 100 mg at bedtime.  I have reviewed potential side effects with the patient and provided her with a handout.  She is advised that if her symptoms worsen or she develops new symptoms she should let us know.   I spent 15 minutes with the patient. 50% of this time was spent reviewing plan of care   Ward Givens, MSN, NP-C 10/29/2017, 9:40 AM [facility] [address], [place], [place] [address] [phone]

## 2017-11-24 ENCOUNTER — Other Ambulatory Visit: Payer: Self-pay | Admitting: Adult Health

## 2017-12-18 DIAGNOSIS — Z30431 Encounter for routine checking of intrauterine contraceptive device: Secondary | ICD-10-CM | POA: Diagnosis not present

## 2018-01-16 DIAGNOSIS — K120 Recurrent oral aphthae: Secondary | ICD-10-CM | POA: Diagnosis not present

## 2018-01-16 DIAGNOSIS — B001 Herpesviral vesicular dermatitis: Secondary | ICD-10-CM | POA: Diagnosis not present

## 2018-04-09 ENCOUNTER — Other Ambulatory Visit: Payer: Self-pay | Admitting: Neurology

## 2018-05-03 DIAGNOSIS — K2900 Acute gastritis without bleeding: Secondary | ICD-10-CM | POA: Diagnosis not present

## 2018-05-03 DIAGNOSIS — R110 Nausea: Secondary | ICD-10-CM | POA: Diagnosis not present

## 2018-05-07 DIAGNOSIS — Z01419 Encounter for gynecological examination (general) (routine) without abnormal findings: Secondary | ICD-10-CM | POA: Diagnosis not present

## 2018-05-24 ENCOUNTER — Other Ambulatory Visit: Payer: Self-pay | Admitting: Neurology

## 2018-05-24 ENCOUNTER — Other Ambulatory Visit: Payer: Self-pay | Admitting: Adult Health

## 2018-06-25 ENCOUNTER — Telehealth: Payer: Self-pay

## 2018-06-25 NOTE — Telephone Encounter (Signed)
Unable to get in contact with the patient to convert their office visit with [facility] on 07/04/2018 into a doxy.me visit. I left a voicemail asking the patient to return my call. Office number was provided.   If patient calls back please convert their office visit into a doxy.me visit.

## 2018-07-02 NOTE — Telephone Encounter (Signed)
Spoke to pt. Due to current COVID 19 pandemic, our office is severely reducing in office visits until further notice, in order to minimize the risk to our patients and healthcare providers.  Pt understands that although there may be some limitations with this type of visit, we will take all precautions to reduce any security or privacy concerns.  Pt understands that this will be treated like an in office visit and we will file with pt's insurance, and there may be a patient responsible charge related to this service.  She consented to doxy.me.  Email sent to  [email or web address] confirmed.   Consented to change to [name], NP dueto MM/NP scheduled change.

## 2018-07-04 ENCOUNTER — Ambulatory Visit (INDEPENDENT_AMBULATORY_CARE_PROVIDER_SITE_OTHER): Payer: BLUE CROSS/BLUE SHIELD | Admitting: Family Medicine

## 2018-07-04 ENCOUNTER — Ambulatory Visit: Payer: BLUE CROSS/BLUE SHIELD | Admitting: Adult Health

## 2018-07-04 ENCOUNTER — Other Ambulatory Visit: Payer: Self-pay

## 2018-07-04 ENCOUNTER — Encounter: Payer: Self-pay | Admitting: Family Medicine

## 2018-07-04 DIAGNOSIS — G43009 Migraine without aura, not intractable, without status migrainosus: Secondary | ICD-10-CM | POA: Diagnosis not present

## 2018-07-04 MED ORDER — GABAPENTIN 100 MG PO CAPS: ORAL_CAPSULE | ORAL | 3 refills | Status: DC

## 2018-07-04 MED ORDER — TOPIRAMATE 200 MG PO TABS: ORAL_TABLET | ORAL | 3 refills | Status: DC

## 2018-07-04 NOTE — Progress Notes (Signed)
PATIENT: [name] DOB: Oct 10, 1980  REASON FOR VISIT: follow up HISTORY FROM: patient  Virtual Visit via Telephone Note  I connected with [name] on 07/04/18 at  9:30 AM EDT by telephone and verified that I am speaking with the correct person using two identifiers.   I discussed the limitations, risks, security and privacy concerns of performing an evaluation and management service by telephone and the availability of in person appointments. I also discussed with the patient that there may be a patient responsible charge related to this service. The patient expressed understanding and agreed to proceed.   History of Present Illness:  07/04/18 [name] is a 38 y.o. female for follow up of migraines. She is taking topirimate 200mg  and gabapentin 100mg  at bedtime. She uses Relpax for abortive therapy. She does report sleepiness with Relpax. She has had about 3 migraines in the past year. She has 3-4 headaches per month. She feels that she is doing really well.    HISTORY (copied from [place] note on 10/29/2017  Ms. Laplante is a 38 year old female with a history of migraine headaches.  She returns today for follow-up.  She states overall her migraine headaches are better.  She has not had a migraine in the last month.  She states that she is not headaches at least 3 times a week.  She typically can take Tylenol or Excedrin tension.  She reports that she does not use Relpax as much because she needs to lay down after she takes it.  She states that she works different shifts at her job which also may be affecting her sleep.   HISTORY 04/19/17 Ms.Hughesis a 38 year old female with a history of migraine. She is currently taking 200 mg at bedtime. She uses Relpax to treat her acute migraines. She reports that she hashad a ongoing headache for the last 3 weeks. She reports that her pain fluctuates from a 4 out of 10 to a 7 out of 10. She reports that the headache  has been constant. She has used Tylenol as well as Relpax. She reports that these medications has decreased the severity but essentially did not resolve the headache. She reports theheadache usuallystartsin the center of the head and radiatesdown to the forehead. She reports with severe headachesshe will have photophobia. She reports occasionally she will have halos in her vision. She confirmed that she is also had nausea and vomiting. She has been using Zofran. In the past she has had a prednisone Dosepak for that it works well for her headache. She returns today for evaluation.  Observations/Objective:  Generalized: Well developed, in no acute distress  Mentation: Alert oriented to time, place, history taking. Follows all commands speech and language fluent   Assessment and Plan:  38 y.o. year old female  has a past medical history of Asthma, Common migraine with intractable migraine (05/11/2016), Hypertension, Migraines, PCOS (polycystic ovarian syndrome), and Seizures ([place]). here with    ICD-10-CM   1. Migraine without aura and without status migrainosus, not intractable G43.009    She is doing well on current regimen of topiramate 200mg  and gabapentin 100mg  at night. She has Relpax for abortive therapy but rarely uses it. We will continue current treatment plan. I have changed topiramate 50mg  tablet to 200mg  tablet per patient request for easier administration. She is aware to take only one tablet at night. She was scheduled for annual follow up. She verbalized understanding and agreement with this plan.   No orders of  the defined types were placed in this encounter.   Meds ordered this encounter  Medications  . gabapentin (NEURONTIN) 100 MG capsule    Sig: Take 1 capsule by mouth once daily at bedtime    Dispense:  90 capsule    Refill:  3    Order Specific Question:   Supervising Provider    Answer:   Melvenia Beam [id number]  . topiramate (TOPAMAX) 200 MG tablet     Sig: Take one tablet at bedtime    Dispense:  90 tablet    Refill:  3    Order Specific Question:   Supervising Provider    Answer:   Melvenia Beam [[id number]]     Follow Up Instructions:  I discussed the assessment and treatment plan with the patient. The patient was provided an opportunity to ask questions and all were answered. The patient agreed with the plan and demonstrated an understanding of the instructions.   The patient was advised to call back or seek an in-person evaluation if the symptoms worsen or if the condition fails to improve as anticipated.  I provided 20 minutes of non-face-to-face time during this encounter.  Patient is located at her place of residence during video conference.  Provider is located in the office.  Liane Comber, RN helped to facilitate visit.   [name], NP

## 2018-07-04 NOTE — Progress Notes (Signed)
I have read the note, and I agree with the clinical assessment and plan.  Strummer Canipe K Raphel Stickles   

## 2018-10-03 ENCOUNTER — Encounter (HOSPITAL_COMMUNITY): Payer: Self-pay | Admitting: Emergency Medicine

## 2018-10-03 ENCOUNTER — Ambulatory Visit (HOSPITAL_COMMUNITY)
Admission: EM | Admit: 2018-10-03 | Discharge: 2018-10-03 | Disposition: A | Discharge status: Home / Self Care | Payer: BC Managed Care – PPO | Attending: Family Medicine | Admitting: Family Medicine

## 2018-10-03 ENCOUNTER — Other Ambulatory Visit: Payer: Self-pay

## 2018-10-03 DIAGNOSIS — L989 Disorder of the skin and subcutaneous tissue, unspecified: Secondary | ICD-10-CM

## 2018-10-03 NOTE — ED Provider Notes (Signed)
[place]    CSN: [id number] Arrival date & time: 10/03/18  1925      History   Chief Complaint Chief Complaint  Patient presents with   Cyst    HPI [name] is a 38 y.o. female.   HPI Slowly growing lump on the back of the right eye.  Getting bigger.  Getting darker.  Getting more uncomfortable.  Did not start office any kind of wound or bite.  No other skin lesions. Past Medical History:  Diagnosis Date   Asthma    Common migraine with intractable migraine 05/11/2016   Hypertension    Migraines    PCOS (polycystic ovarian syndrome)    Seizures ([place])    c/b anesthesia    Patient Active Problem List   Diagnosis Date Noted   Common migraine with intractable migraine 05/11/2016   Axillary lump 06/25/2012    Past Surgical History:  Procedure Laterality Date   CESAREAN SECTION     one previous   HERNIA REPAIR     NASAL RECONSTRUCTION      OB History    Gravida  1   Para  1   Term  1   Preterm      AB      Living  1     SAB      TAB      Ectopic      Multiple      Live Births               Home Medications    Prior to Admission medications   Medication Sig Start Date End Date Taking? Authorizing Provider  Acetaminophen-Caffeine (EXCEDRIN TENSION HEADACHE) 500-65 MG TABS Take 2 tablets by mouth every 6 (six) hours as needed (headache).    [provider]  eletriptan (RELPAX) 40 MG tablet Take 1 tablet (40 mg total) by mouth 2 (two) times daily as needed for migraine or headache. 10/26/16   Kathrynn Ducking, MD  gabapentin (NEURONTIN) 100 MG capsule Take 1 capsule by mouth once daily at bedtime 07/04/18   Lomax, Amy, NP  levonorgestrel (MIRENA) 20 MCG/24HR IUD 1 each by Intrauterine route once.    [provider]  ondansetron (ZOFRAN) 4 MG tablet Take 1 tablet (4 mg total) by mouth every 8 (eight) hours as needed for nausea or vomiting. 05/11/16   Kathrynn Ducking, MD  topiramate (TOPAMAX) 200  MG tablet Take one tablet at bedtime 07/04/18   Lomax, Amy, NP  valACYclovir (VALTREX) 500 MG tablet Take 500 mg by mouth as needed (exacerbation).  04/11/17   [provider]    Family History Family History  Problem Relation Age of Onset   Breast cancer Maternal Aunt    Diabetes Maternal Grandmother    Diabetes Paternal Grandfather    Cancer Maternal Aunt        ovarian    Social History Social History   Tobacco Use   Smoking status: Never Smoker   Smokeless tobacco: Never Used  Substance Use Topics   Alcohol use: Yes    Comment: occasionally   Drug use: No     Allergies   Amoxicillin, Aspirin, and Motrin [ibuprofen]   Review of Systems Review of Systems  Constitutional: Negative for chills and fever.  HENT: Negative for ear pain and sore throat.   Eyes: Negative for pain and visual disturbance.  Respiratory: Negative for cough and shortness of breath.   Cardiovascular: Negative for chest pain and  palpitations.  Gastrointestinal: Negative for abdominal pain and vomiting.  Genitourinary: Negative for dysuria and hematuria.  Musculoskeletal: Negative for arthralgias and back pain.  Skin: Positive for wound. Negative for color change and rash.  Neurological: Negative for seizures and syncope.  All other systems reviewed and are negative.    Physical Exam Triage Vital Signs ED Triage Vitals  Enc Vitals Group     BP 10/03/18 2003 (!) 148/73     Pulse Rate 10/03/18 2003 63     Resp 10/03/18 2003 18     Temp 10/03/18 2003 98.6 F (37 C)     Temp Source 10/03/18 2003 Oral     SpO2 10/03/18 2003 98 %     Weight --      Height --      Head Circumference --      Peak Flow --      Pain Score 10/03/18 2004 0     Pain Loc --      Pain Edu? --      Excl. in [place]? --    No data found.  Updated Vital Signs BP (!) 148/73 (BP Location: Right Arm)    Pulse 63    Temp 98.6 F (37 C) (Oral)    Resp 18    SpO2 98%       Physical Exam Constitutional:       General: She is not in acute distress.    Appearance: She is well-developed.  HENT:     Head: Normocephalic and atraumatic.  Eyes:     Conjunctiva/sclera: Conjunctivae normal.     Pupils: Pupils are equal, round, and reactive to light.  Neck:     Musculoskeletal: Normal range of motion.  Cardiovascular:     Rate and Rhythm: Normal rate.  Pulmonary:     Effort: Pulmonary effort is normal. No respiratory distress.  Abdominal:     General: There is no distension.     Palpations: Abdomen is soft.  Musculoskeletal: Normal range of motion.  Skin:    General: Skin is warm and dry.  Neurological:     Mental Status: She is alert.      See photo; this is the back of patient's right thigh.  The nodule measures 15 mm across.  Firm.  Nontender.  Subcutaneous  UC Treatments / Results  Labs (all labs ordered are listed, but only abnormal results are displayed) Labs Reviewed - No data to display  EKG   Radiology No results found.  Procedures Procedures (including critical care time)  Medications Ordered in UC Medications - No data to display  Initial Impression / Assessment and Plan / UC Course  I have reviewed the triage vital signs and the nursing notes.  Pertinent labs & imaging results that were available during my care of the patient were reviewed by me and considered in my medical decision making (see chart for details).     Skin nodule.  Looks most like a dermatofibroma. Final Clinical Impressions(s) / UC Diagnoses   Final diagnoses:  Skin lesion of right leg     Discharge Instructions     I believe that the skin bump is benign and does not pose any risk to your health.  I recommend that you call your primary care doctor in order to see a dermatologist or surgeon to have it removed    ED Prescriptions    None     Controlled Substance Prescriptions [place] Controlled Substance Registry consulted? Not Applicable  Raylene Everts, MD 10/03/18 2101

## 2018-10-03 NOTE — Discharge Instructions (Addendum)
I believe that the skin bump is benign and does not pose any risk to your health.  I recommend that you call your primary care doctor in order to see a dermatologist or surgeon to have it removed

## 2018-10-03 NOTE — ED Triage Notes (Signed)
Pt here for hardened area to back of right leg x 1 month

## 2018-11-05 DIAGNOSIS — R229 Localized swelling, mass and lump, unspecified: Secondary | ICD-10-CM | POA: Diagnosis not present

## 2018-11-05 DIAGNOSIS — J45909 Unspecified asthma, uncomplicated: Secondary | ICD-10-CM | POA: Diagnosis not present

## 2018-11-06 DIAGNOSIS — R509 Fever, unspecified: Secondary | ICD-10-CM | POA: Diagnosis not present

## 2018-11-19 ENCOUNTER — Emergency Department (HOSPITAL_COMMUNITY): Payer: BC Managed Care – PPO

## 2018-11-19 ENCOUNTER — Emergency Department (HOSPITAL_COMMUNITY)
Admission: EM | Admit: 2018-11-19 | Discharge: 2018-11-19 | Disposition: A | Discharge status: Home / Self Care | Payer: BC Managed Care – PPO | Attending: Emergency Medicine | Admitting: Emergency Medicine

## 2018-11-19 ENCOUNTER — Other Ambulatory Visit: Payer: Self-pay

## 2018-11-19 DIAGNOSIS — R519 Headache, unspecified: Secondary | ICD-10-CM | POA: Diagnosis not present

## 2018-11-19 DIAGNOSIS — Z79899 Other long term (current) drug therapy: Secondary | ICD-10-CM | POA: Diagnosis not present

## 2018-11-19 DIAGNOSIS — J45909 Unspecified asthma, uncomplicated: Secondary | ICD-10-CM | POA: Diagnosis not present

## 2018-11-19 DIAGNOSIS — I10 Essential (primary) hypertension: Secondary | ICD-10-CM | POA: Diagnosis not present

## 2018-11-19 DIAGNOSIS — Z975 Presence of (intrauterine) contraceptive device: Secondary | ICD-10-CM | POA: Diagnosis not present

## 2018-11-19 LAB — COMPREHENSIVE METABOLIC PANEL
ALT: 12 U/L (ref 0–44)
AST: 17 U/L (ref 15–41)
Albumin: 3.9 g/dL (ref 3.5–5.0)
Alkaline Phosphatase: 45 U/L (ref 38–126)
Anion gap: 8 (ref 5–15)
BUN: 11 mg/dL (ref 6–20)
CO2: 23 mmol/L (ref 22–32)
Calcium: 9.1 mg/dL (ref 8.9–10.3)
Chloride: 111 mmol/L (ref 98–111)
Creatinine, Ser: 0.92 mg/dL (ref 0.44–1.00)
GFR calc Af Amer: >60 mL/min (ref >60)
GFR calc non Af Amer: >60 mL/min (ref >60)
Glucose, Bld: 88 mg/dL (ref 70–99)
Potassium: 3.6 mmol/L (ref 3.5–5.1)
Sodium: 142 mmol/L (ref 135–145)
Total Bilirubin: 0.7 mg/dL (ref 0.3–1.2)
Total Protein: 6.2 g/dL — ABNORMAL LOW (ref 6.5–8.1)

## 2018-11-19 LAB — URINALYSIS, ROUTINE W REFLEX MICROSCOPIC
Bilirubin Urine: NEGATIVE
Glucose, UA: NEGATIVE mg/dL
Ketones, ur: NEGATIVE mg/dL
Leukocytes,Ua: NEGATIVE
Nitrite: NEGATIVE
Protein, ur: NEGATIVE mg/dL
Specific Gravity, Urine: 1.019 (ref 1.005–1.030)
pH: 7 (ref 5.0–8.0)

## 2018-11-19 LAB — CBC WITH DIFFERENTIAL/PLATELET
Abs Immature Granulocytes: 0.01 10*3/uL (ref 0.00–0.07)
Basophils Absolute: 0 10*3/uL (ref 0.0–0.1)
Basophils Relative: 1 %
Eosinophils Absolute: 0.1 10*3/uL (ref 0.0–0.5)
Eosinophils Relative: 1 %
HCT: 39.8 % (ref 36.0–46.0)
Hemoglobin: 13 g/dL (ref 12.0–15.0)
Immature Granulocytes: 0 %
Lymphocytes Relative: 24 %
Lymphs Abs: 1.3 10*3/uL (ref 0.7–4.0)
MCH: 31 pg (ref 26.0–34.0)
MCHC: 32.7 g/dL (ref 30.0–36.0)
MCV: 94.8 fL (ref 80.0–100.0)
Monocytes Absolute: 0.3 10*3/uL (ref 0.1–1.0)
Monocytes Relative: 6 %
Neutro Abs: 3.5 10*3/uL (ref 1.7–7.7)
Neutrophils Relative %: 68 %
Platelets: 236 10*3/uL (ref 150–400)
RBC: 4.2 MIL/uL (ref 3.87–5.11)
RDW: 13.6 % (ref 11.5–15.5)
WBC: 5.2 10*3/uL (ref 4.0–10.5)
nRBC: 0 % (ref 0.0–0.2)

## 2018-11-19 LAB — POC URINE PREG, ED: Preg Test, Ur: NEGATIVE

## 2018-11-19 MED ORDER — DEXAMETHASONE SODIUM PHOSPHATE 10 MG/ML IJ SOLN
10.0000 mg | Freq: Once | INTRAMUSCULAR | Status: AC
  Administered 2018-11-19: 10 mg via INTRAVENOUS
  Filled 2018-11-19: qty 1

## 2018-11-19 MED ORDER — SODIUM CHLORIDE 0.9 % IV BOLUS
1000.0000 mL | Freq: Once | INTRAVENOUS | Status: AC
  Administered 2018-11-19: 1000 mL via INTRAVENOUS

## 2018-11-19 MED ORDER — METOCLOPRAMIDE HCL 5 MG/ML IJ SOLN
10.0000 mg | Freq: Once | INTRAMUSCULAR | Status: AC
  Administered 2018-11-19: 10 mg via INTRAVENOUS
  Filled 2018-11-19: qty 2

## 2018-11-19 MED ORDER — IOHEXOL 350 MG/ML SOLN
75.0000 mL | Freq: Once | INTRAVENOUS | Status: AC | PRN
  Administered 2018-11-19: 75 mL via INTRAVENOUS

## 2018-11-19 MED ORDER — FENTANYL CITRATE (PF) 100 MCG/2ML IJ SOLN
INTRAMUSCULAR | Status: AC
  Filled 2018-11-19: qty 2

## 2018-11-19 MED ORDER — FENTANYL CITRATE (PF) 100 MCG/2ML IJ SOLN
50.0000 ug | Freq: Once | INTRAMUSCULAR | Status: AC
  Administered 2018-11-19: 50 ug via INTRAVENOUS

## 2018-11-19 MED ORDER — DIPHENHYDRAMINE HCL 50 MG/ML IJ SOLN
25.0000 mg | Freq: Once | INTRAMUSCULAR | Status: AC
  Administered 2018-11-19: 25 mg via INTRAVENOUS
  Filled 2018-11-19: qty 1

## 2018-11-19 MED ORDER — ACETAMINOPHEN 500 MG PO TABS
1000.0000 mg | ORAL_TABLET | Freq: Once | ORAL | Status: AC
  Administered 2018-11-19: 1000 mg via ORAL
  Filled 2018-11-19: qty 2

## 2018-11-19 NOTE — ED Notes (Signed)
Pt verbalized understanding of discharge instructions and follow up care

## 2018-11-19 NOTE — ED Provider Notes (Signed)
[facility] EMERGENCY DEPARTMENT Provider Note   CSN: [id number] Arrival date & time: 11/19/18  [id number]     History   Chief Complaint Chief Complaint  Patient presents with   Migraine    HPI [name] is a 38 y.o. female with a history of chronic migraines on triptan maintenance therapy, hypertension, polycystic ovarian syndrome, presenting to the emergency department with migraine.  Patient reports this was insidious onset approximately 1 week ago. She states the pain is throbbing and located in her central forehead and behind her right eye.  She states the location is generally typical for her migraines, but she does not have migraines lasting 7 days.  She says she has not had a migraine for about 7 to 8 months since being started on her migraine therapy by her neurologist Dr. Jannifer Franklin.  This headache is constant.  Associated with nausea but no vomiting.  She describes intermittent episodes of blurred vision in her right eye.  Currently her vision is intact.  She also reports bilateral symmetrical leg swelling for the past week.  She denies any chest pain or shortness of breath.  She denies any flank pain or dysuria or hematuria.  She reports that she has a twin sister with a diagnosis of pseudotumor cerebri.  She denies any history of blood clots in her legs or lungs.  She denies any fevers, chills, neck pain or stiffness.  She denies any new rashes anywhere.     HPI  Past Medical History:  Diagnosis Date   Asthma    Common migraine with intractable migraine 05/11/2016   Hypertension    Migraines    PCOS (polycystic ovarian syndrome)    Seizures (HCC)    c/b anesthesia    Patient Active Problem List   Diagnosis Date Noted   Common migraine with intractable migraine 05/11/2016   Axillary lump 06/25/2012    Past Surgical History:  Procedure Laterality Date   CESAREAN SECTION     one previous   HERNIA REPAIR     NASAL RECONSTRUCTION        OB History    Gravida  1   Para  1   Term  1   Preterm      AB      Living  1     SAB      TAB      Ectopic      Multiple      Live Births               Home Medications    Prior to Admission medications   Medication Sig Start Date End Date Taking? Authorizing Provider  Acetaminophen-Caffeine (EXCEDRIN TENSION HEADACHE) 500-65 MG TABS Take 2 tablets by mouth every 6 (six) hours as needed (headache).    [provider]  eletriptan (RELPAX) 40 MG tablet Take 1 tablet (40 mg total) by mouth 2 (two) times daily as needed for migraine or headache. 10/26/16   Kathrynn Ducking, MD  gabapentin (NEURONTIN) 100 MG capsule Take 1 capsule by mouth once daily at bedtime 07/04/18   Lomax, Amy, NP  levonorgestrel (MIRENA) 20 MCG/24HR IUD 1 each by Intrauterine route once.    [provider]  ondansetron (ZOFRAN) 4 MG tablet Take 1 tablet (4 mg total) by mouth every 8 (eight) hours as needed for nausea or vomiting. 05/11/16   Kathrynn Ducking, MD  topiramate (TOPAMAX) 200 MG tablet Take one tablet at bedtime 07/04/18  Lomax, Amy, NP  valACYclovir (VALTREX) 500 MG tablet Take 500 mg by mouth as needed (exacerbation).  04/11/17   [provider]    Family History Family History  Problem Relation Age of Onset   Breast cancer Maternal Aunt    Diabetes Maternal Grandmother    Diabetes Paternal Grandfather    Cancer Maternal Aunt        ovarian    Social History Social History   Tobacco Use   Smoking status: Never Smoker   Smokeless tobacco: Never Used  Substance Use Topics   Alcohol use: Yes    Comment: occasionally   Drug use: No     Allergies   Amoxicillin, Aspirin, and Motrin [ibuprofen]   Review of Systems Review of Systems  Constitutional: Negative for chills, fatigue and fever.  Eyes: Positive for visual disturbance. Negative for photophobia, pain and redness.  Respiratory: Negative for cough and shortness of breath.     Cardiovascular: Negative for chest pain and palpitations.  Gastrointestinal: Positive for nausea. Negative for abdominal pain and vomiting.  Musculoskeletal: Negative for arthralgias and back pain.  Skin: Negative for color change and rash.  Neurological: Positive for headaches. Negative for seizures, syncope, weakness and light-headedness.  All other systems reviewed and are negative.    Physical Exam Updated Vital Signs BP 133/85 (BP Location: Left Arm)    Pulse 62    Temp 98.4 F (36.9 C) (Oral)    Resp 14    Ht 5\' 6"  (1.676 m)    Wt 66.7 kg    LMP 11/10/2018    SpO2 99%    BMI 23.73 kg/m   Physical Exam Vitals signs and nursing note reviewed.  Constitutional:      General: She is not in acute distress.    Appearance: She is well-developed.  HENT:     Head: Normocephalic and atraumatic.  Eyes:     General: No visual field deficit.       Right eye: No discharge.        Left eye: No discharge.     Extraocular Movements: Extraocular movements intact.     Conjunctiva/sclera: Conjunctivae normal.     Pupils: Pupils are equal, round, and reactive to light.     Comments: Peripheral fields intact  Neck:     Musculoskeletal: Normal range of motion and neck supple. No neck rigidity.  Cardiovascular:     Rate and Rhythm: Normal rate and regular rhythm.     Pulses: Normal pulses.  Pulmonary:     Effort: Pulmonary effort is normal. No respiratory distress.     Breath sounds: Normal breath sounds.  Abdominal:     Palpations: Abdomen is soft.     Tenderness: There is no abdominal tenderness.  Musculoskeletal:     Right lower leg: Edema present.     Left lower leg: Edema present.  Skin:    General: Skin is warm and dry.  Neurological:     Mental Status: She is alert.     GCS: GCS eye subscore is 4. GCS verbal subscore is 5. GCS motor subscore is 6.     Cranial Nerves: Cranial nerves are intact. No cranial nerve deficit, dysarthria or facial asymmetry.     Sensory: Sensation is  intact.     Motor: Motor function is intact.     Gait: Gait is intact.  Psychiatric:        Mood and Affect: Mood normal.        Behavior:  Behavior normal.      ED Treatments / Results  Labs (all labs ordered are listed, but only abnormal results are displayed) Labs Reviewed  COMPREHENSIVE METABOLIC PANEL - Abnormal; Notable for the following components:      Result Value   Total Protein 6.2 (*)    All other components within normal limits  URINALYSIS, ROUTINE W REFLEX MICROSCOPIC - Abnormal; Notable for the following components:   Hgb urine dipstick SMALL (*)    Bacteria, UA RARE (*)    All other components within normal limits  CBC WITH DIFFERENTIAL/PLATELET  POC URINE PREG, ED    EKG EKG Interpretation  Date/Time:  Tuesday November 19 2018 11:21:59 EDT Ventricular Rate:  66 PR Interval:    QRS Duration: 105 QT Interval:  415 QTC Calculation: 435 R Axis:   67 Text Interpretation:  Sinus rhythm QTc within normal limits  No STEMI  Confirmed by Octaviano Glow [id number]) on 11/19/2018 11:28:42 AM   Radiology Ct Venogram Head  Result Date: 11/19/2018 CLINICAL DATA:  Suspected dural venous sinus thrombosis. Severe headache. EXAM: CT VENOGRAM HEAD TECHNIQUE: Head CT was performed pre contrast followed by postcontrast scanning and reconstruction. CONTRAST:  48mL OMNIPAQUE IOHEXOL 350 MG/ML SOLN COMPARISON:  09/08/2011 FINDINGS: Brain: The brain shows a normal appearance without evidence of malformation, atrophy, old or acute small or large vessel infarction, mass lesion, hemorrhage, hydrocephalus or extra-axial collection. Vascular: No hyperdense vessel. No evidence of atherosclerotic calcification. Skull: Normal.  No traumatic finding.  No focal bone lesion. Sinuses/Orbits: Sinuses are clear. Orbits appear normal. Mastoids are clear. Other: None significant Evaluation of the venous structures shows no evidence of sinus thrombosis. Deep veins are patent. No evidence of superficial  vein thrombosis. IMPRESSION: Normal head CT. Normal CT venography. Electronically Signed   By: Nelson Chimes M.D.   On: 11/19/2018 14:21    Procedures .Lumbar Puncture  Date/Time: 11/19/2018 8:02 PM Performed by: Wyvonnia Dusky, MD Authorized by: Wyvonnia Dusky, MD   Consent:    Consent obtained:  Verbal   Consent given by:  Patient   Risks discussed:  Bleeding, headache, infection, pain, repeat procedure and nerve damage   Alternatives discussed:  No treatment and delayed treatment Pre-procedure details:    Procedure purpose:  Diagnostic   Preparation: Patient was prepped and draped in usual sterile fashion   Anesthesia (see MAR for exact dosages):    Anesthesia method:  Local infiltration   Local anesthetic:  Lidocaine 1% w/o epi Procedure details:    Lumbar space:  L4-L5 interspace   Patient position:  R lateral decubitus   Needle gauge:  20   Needle length (in):  3.5   Ultrasound guidance: no     Number of attempts:  3 Post-procedure:    Puncture site:  Direct pressure applied and adhesive bandage applied   Patient tolerance of procedure:  Tolerated well, no immediate complications Comments:     Unsuccessful lumbar puncture   (including critical care time)  Medications Ordered in ED Medications  fentaNYL (SUBLIMAZE) 100 MCG/2ML injection (has no administration in time range)  sodium chloride 0.9 % bolus 1,000 mL (0 mLs Intravenous Stopped 11/19/18 1314)  metoCLOPramide (REGLAN) injection 10 mg (10 mg Intravenous Given 11/19/18 1147)  dexamethasone (DECADRON) injection 10 mg (10 mg Intravenous Given 11/19/18 1147)  acetaminophen (TYLENOL) tablet 1,000 mg (1,000 mg Oral Given 11/19/18 1111)  diphenhydrAMINE (BENADRYL) injection 25 mg (25 mg Intravenous Given 11/19/18 1147)  iohexol (OMNIPAQUE) 350 MG/ML injection 75 mL (  75 mLs Intravenous Contrast Given 11/19/18 1238)  fentaNYL (SUBLIMAZE) injection 50 mcg (50 mcg Intravenous Given 11/19/18 1628)     Initial  Impression / Assessment and Plan / ED Course  I have reviewed the triage vital signs and the nursing notes.  Pertinent labs & imaging results that were available during my care of the patient were reviewed by me and considered in my medical decision making (see chart for details).  38 year old female with a history of polycystic ovarian syndrome presenting to emergency department with insidious onset headache 1 week ago which has been constant and progressive.  She describes intermittent episodes of blurred vision in her right eye.  She also describes unilateral leg swelling.  On exam she does have bilateral +1 pitting edema to the mid shins.  Her pregnancy is negative and her kidney function is within normal limits here.   Her neurological exam is benign.  I have very low suspicion for meningitis based on her clinical history and exam.  There is concern for pseudotumor cerebri based on this presentation.  She reports her twin sister has this condition.  However before proceeding to LP, I believe it is reasonable obtain a CT scan of the head and CT venogram to evaluate for dural sinus thrombosis or new intracranial lesion to explain the symptoms.  I have a low suspicion for subarachnoid hemorrhage or ruptured aneurysm and do not believe this cause of her symptoms.      Clinical Course as of Nov 19 2002  Tue Nov 19, 2018  1449 FINDINGS: Brain: The brain shows a normal appearance without evidence of malformation, atrophy, old or acute small or large vessel infarction, mass lesion, hemorrhage, hydrocephalus or extra-axial collection.  Vascular: No hyperdense vessel. No evidence of atherosclerotic calcification.  Skull: Normal. No traumatic finding. No focal bone lesion.  Sinuses/Orbits: Sinuses are clear. Orbits appear normal. Mastoids are clear.  Other: None significant  Evaluation of the venous structures shows no evidence of sinus thrombosis. Deep veins are patent. No evidence  of superficial vein thrombosis.  IMPRESSION: Normal head CT. Normal CT venography.   [MT]  1628 Unsuccessful LP after 3 attempts, stopped procedure.  Will reach out to neurology   [MT]  1629 Patient otherwise reporting headache had improved with cocktail given in ED   [MT]  1739 I was able to speak to Dr. Lorraine Lax of neurology.  We reviewed the patient's case and her imaging including CT venogram.  I explained my concerns for pseudotumor.  I explained that I was unable to obtain an LP, however unfortunately it appears that the fluoroscopy team is not available at this hour. Dr Aroor states that this may still be complicated migraine given that visual symptoms are limited to one eye, and given the fact that her headache has improved and she has no active visual symptoms, we can discharge her.  Will give her IR f/u for outpatient LP.  I will also message Dr. Jannifer Franklin and have the patient f/u with her own neurologist.  Dr. Lorraine Lax did not recommend starting acetazolamide at this time.   [MT]  [id number] I reviewed this with the patient and advised that she return to the ED immediately if her blurred vision returns or worsens, or if her headache worsens.  She verbalized udnerstanding.   [MT]    Clinical Course User Index [MT] Cort Dragoo, Carola Rhine, MD     Final Clinical Impressions(s) / ED Diagnoses   Final diagnoses:  Nonintractable headache, unspecified chronicity pattern, unspecified  headache type    ED Discharge Orders         Ordered    Ambulatory referral to Interventional Radiology    Comments: Patient needs diagnostic lumbar puncture for opening pressure for pseudotumor cerebrii diagnosis.  This referral is for the [facility]   11/19/18 1757           Wyvonnia Dusky, MD 11/19/18 2005

## 2018-11-19 NOTE — ED Notes (Signed)
MD at bedside for LP.

## 2018-11-19 NOTE — ED Notes (Signed)
Pt back from CT

## 2018-11-19 NOTE — ED Notes (Signed)
Patient transported to CT 

## 2018-11-19 NOTE — ED Triage Notes (Signed)
Pt reports migraine and vomiting for the last 8 days. Has been taking prescribed migraine medicine with no relief. Rizatriptan 10mg . Denies recent fever. Some vomiting. COVID test negative in the last 14 days. VSS. Alert, oriented x4.

## 2018-11-19 NOTE — Discharge Instructions (Addendum)
As we discussed today, it is possible your headache is being caused by a complicated migraine.  It is also possible that this is a condition called pseudotumor cerebrii.  We were not able to successfully complete the lumbar puncture (spinal tap) needed to diagnosis this condition.   We recommended that you follow up with our interventional radiology team at [facility] to have this procedure done as an outpatient.  They can do the lumbar puncture with xray imaging to assist them.  They will need to measure your opening pressure to help with the diagnosis. Schedule an appointment by calling [phone].  Tell them you were seen in the [facility] ER and referred there by the ER doctor (Dr. Langston Masker).  You should also call Dr. Tobey Grim office tomorrow to discuss your ER visit.    *  Come back to the ER immediately if you have new or worsening blurred vision, worsening headache, vomiting that does not stop, or dizziness or numbness.    I've included information about pseudotumor, also known as Idiopathic Intracranial Hypertension, for you to read over.

## 2018-11-19 NOTE — ED Notes (Signed)
IV team at bedside 

## 2018-11-20 ENCOUNTER — Other Ambulatory Visit: Payer: Self-pay | Admitting: Emergency Medicine

## 2018-11-20 DIAGNOSIS — G932 Benign intracranial hypertension: Secondary | ICD-10-CM

## 2018-11-21 ENCOUNTER — Ambulatory Visit
Admission: RE | Admit: 2018-11-21 | Discharge: 2018-11-21 | Disposition: A | Discharge status: Home / Self Care | Payer: BC Managed Care – PPO | Source: Ambulatory Visit | Attending: Emergency Medicine | Admitting: Emergency Medicine

## 2018-11-21 ENCOUNTER — Other Ambulatory Visit: Payer: Self-pay

## 2018-11-21 DIAGNOSIS — G932 Benign intracranial hypertension: Secondary | ICD-10-CM | POA: Diagnosis not present

## 2018-11-21 NOTE — Discharge Instructions (Signed)

## 2018-11-23 ENCOUNTER — Emergency Department (HOSPITAL_COMMUNITY)
Admission: EM | Admit: 2018-11-23 | Discharge: 2018-11-24 | Disposition: A | Discharge status: Home / Self Care | Payer: BC Managed Care – PPO | Attending: Emergency Medicine | Admitting: Emergency Medicine

## 2018-11-23 ENCOUNTER — Encounter (HOSPITAL_COMMUNITY): Payer: Self-pay | Admitting: Emergency Medicine

## 2018-11-23 ENCOUNTER — Other Ambulatory Visit: Payer: Self-pay

## 2018-11-23 DIAGNOSIS — G932 Benign intracranial hypertension: Secondary | ICD-10-CM | POA: Diagnosis not present

## 2018-11-23 DIAGNOSIS — Z8709 Personal history of other diseases of the respiratory system: Secondary | ICD-10-CM | POA: Insufficient documentation

## 2018-11-23 DIAGNOSIS — I10 Essential (primary) hypertension: Secondary | ICD-10-CM | POA: Insufficient documentation

## 2018-11-23 DIAGNOSIS — R519 Headache, unspecified: Secondary | ICD-10-CM | POA: Diagnosis present

## 2018-11-23 LAB — CBC WITH DIFFERENTIAL/PLATELET
Abs Immature Granulocytes: 0.01 10*3/uL (ref 0.00–0.07)
Basophils Absolute: 0.1 10*3/uL (ref 0.0–0.1)
Basophils Relative: 1 %
Eosinophils Absolute: 0.1 10*3/uL (ref 0.0–0.5)
Eosinophils Relative: 2 %
HCT: 45.7 % (ref 36.0–46.0)
Hemoglobin: 15.3 g/dL — ABNORMAL HIGH (ref 12.0–15.0)
Immature Granulocytes: 0 %
Lymphocytes Relative: 40 %
Lymphs Abs: 2.2 10*3/uL (ref 0.7–4.0)
MCH: 31.2 pg (ref 26.0–34.0)
MCHC: 33.5 g/dL (ref 30.0–36.0)
MCV: 93.1 fL (ref 80.0–100.0)
Monocytes Absolute: 0.3 10*3/uL (ref 0.1–1.0)
Monocytes Relative: 5 %
Neutro Abs: 3 10*3/uL (ref 1.7–7.7)
Neutrophils Relative %: 52 %
Platelets: 285 10*3/uL (ref 150–400)
RBC: 4.91 MIL/uL (ref 3.87–5.11)
RDW: 13.4 % (ref 11.5–15.5)
WBC: 5.7 10*3/uL (ref 4.0–10.5)
nRBC: 0 % (ref 0.0–0.2)

## 2018-11-23 LAB — URINALYSIS, ROUTINE W REFLEX MICROSCOPIC
Bilirubin Urine: NEGATIVE
Glucose, UA: NEGATIVE mg/dL
Hgb urine dipstick: NEGATIVE
Ketones, ur: NEGATIVE mg/dL
Leukocytes,Ua: NEGATIVE
Nitrite: NEGATIVE
Protein, ur: NEGATIVE mg/dL
Specific Gravity, Urine: 1.016 (ref 1.005–1.030)
pH: 7 (ref 5.0–8.0)

## 2018-11-23 LAB — COMPREHENSIVE METABOLIC PANEL
ALT: 17 U/L (ref 0–44)
AST: 16 U/L (ref 15–41)
Albumin: 4.2 g/dL (ref 3.5–5.0)
Alkaline Phosphatase: 49 U/L (ref 38–126)
Anion gap: 8 (ref 5–15)
BUN: 17 mg/dL (ref 6–20)
CO2: 23 mmol/L (ref 22–32)
Calcium: 9.2 mg/dL (ref 8.9–10.3)
Chloride: 110 mmol/L (ref 98–111)
Creatinine, Ser: 1.1 mg/dL — ABNORMAL HIGH (ref 0.44–1.00)
GFR calc Af Amer: >60 mL/min (ref >60)
GFR calc non Af Amer: >60 mL/min (ref >60)
Glucose, Bld: 90 mg/dL (ref 70–99)
Potassium: 3.4 mmol/L — ABNORMAL LOW (ref 3.5–5.1)
Sodium: 141 mmol/L (ref 135–145)
Total Bilirubin: 0.9 mg/dL (ref 0.3–1.2)
Total Protein: 6.7 g/dL (ref 6.5–8.1)

## 2018-11-23 LAB — I-STAT BETA HCG BLOOD, ED (MC, WL, AP ONLY): I-stat hCG, quantitative: <5 m[IU]/mL (ref <5)

## 2018-11-23 NOTE — ED Triage Notes (Signed)
Patient reports persistent headache onset last week with neck stiffness/emesis , seen here 11/19/18 , LP done out patient result shows pseudotumor cerebri . Denies fever or chills .

## 2018-11-24 MED ORDER — ACETAZOLAMIDE 250 MG PO TABS: 500.0000 mg | ORAL_TABLET | Freq: Two times a day (BID) | ORAL | 1 refills | Status: DC

## 2018-11-24 MED ORDER — DEXAMETHASONE SODIUM PHOSPHATE 10 MG/ML IJ SOLN
10.0000 mg | Freq: Once | INTRAMUSCULAR | Status: AC
  Administered 2018-11-24: 10 mg via INTRAVENOUS
  Filled 2018-11-24: qty 1

## 2018-11-24 MED ORDER — METOCLOPRAMIDE HCL 5 MG/ML IJ SOLN
10.0000 mg | Freq: Once | INTRAMUSCULAR | Status: AC
  Administered 2018-11-24: 10 mg via INTRAVENOUS
  Filled 2018-11-24: qty 2

## 2018-11-24 MED ORDER — DIPHENHYDRAMINE HCL 50 MG/ML IJ SOLN
25.0000 mg | Freq: Once | INTRAMUSCULAR | Status: AC
  Administered 2018-11-24: 25 mg via INTRAVENOUS
  Filled 2018-11-24: qty 1

## 2018-11-24 MED ORDER — MAGNESIUM SULFATE 2 GM/50ML IV SOLN
2.0000 g | Freq: Once | INTRAVENOUS | Status: AC
  Administered 2018-11-24: 2 g via INTRAVENOUS
  Filled 2018-11-24: qty 50

## 2018-11-24 MED ORDER — VALPROATE SODIUM 500 MG/5ML IV SOLN
500.0000 mg | Freq: Once | INTRAVENOUS | Status: AC
  Administered 2018-11-24: 500 mg via INTRAVENOUS
  Filled 2018-11-24: qty 5

## 2018-11-24 MED ORDER — STERILE WATER FOR INJECTION IJ SOLN
INTRAMUSCULAR | Status: AC
  Administered 2018-11-24: 10 mL
  Filled 2018-11-24: qty 10

## 2018-11-24 MED ORDER — ACETAZOLAMIDE SODIUM 500 MG IJ SOLR
500.0000 mg | Freq: Once | INTRAMUSCULAR | Status: AC
  Administered 2018-11-24: 500 mg via INTRAVENOUS
  Filled 2018-11-24: qty 500

## 2018-11-24 NOTE — ED Notes (Signed)
Discharge instructions discussed with pt. Pt verbalized understanding. Pt stable and ambulatory. No signature pad available. 

## 2018-11-24 NOTE — ED Provider Notes (Signed)
Emergency Department Provider Note   I have reviewed the triage vital signs and the nursing notes.   HISTORY  Chief Complaint Headache   HPI [name] is a 38 y.o. female who presents the emergency department today for recurrent headache.  Patient was recently diagnosed with idiopathic intracranial hypertension and had a lumbar puncture where they brought her pressures down to around 12 from 30.  States that the negative start on medication.  She states her headache did not go away with that but did improve some but now seems to be worsening again.  She had a one brief episode of a few seconds of blurry vision earlier in the day but nothing persistent.  No other neurologic changes.  No fevers.  She does have a bit of a stiff neck but this has been there for a couple weeks.   No other associated or modifying symptoms.    Past Medical History:  Diagnosis Date  . Asthma   . Common migraine with intractable migraine 05/11/2016  . Hypertension   . Migraines   . PCOS (polycystic ovarian syndrome)   . Seizures ([place])    c/b anesthesia    Patient Active Problem List   Diagnosis Date Noted  . Common migraine with intractable migraine 05/11/2016  . Axillary lump 06/25/2012    Past Surgical History:  Procedure Laterality Date  . CESAREAN SECTION     one previous  . HERNIA REPAIR    . NASAL RECONSTRUCTION      Current Outpatient Rx  . Order #: [id number] Class: Historical Med  . Order #: [id number] Class: Normal  . Order #: [id number] Class: Historical Med  . Order #: [id number] Class: Normal  . Order #: [id number] Class: Normal  . Order #: [id number] Class: Normal  . Order #: [id number] Class: Normal    Allergies Amoxicillin, Aspirin, and Motrin [ibuprofen]  Family History  Problem Relation Age of Onset  . Breast cancer Maternal Aunt   . Diabetes Maternal Grandmother   . Diabetes Paternal Grandfather   . Cancer Maternal Aunt        ovarian    Social History Social  History   Tobacco Use  . Smoking status: Never Smoker  . Smokeless tobacco: Never Used  Substance Use Topics  . Alcohol use: Yes    Comment: occasionally  . Drug use: No    Review of Systems  All other systems negative except as documented in the HPI. All pertinent positives and negatives as reviewed in the HPI. ____________________________________________   PHYSICAL EXAM:  VITAL SIGNS: ED Triage Vitals  Enc Vitals Group     BP 11/23/18 1948 124/84     Pulse Rate 11/23/18 1948 72     Resp 11/23/18 1948 18     Temp 11/23/18 1948 98.6 F (37 C)     Temp Source 11/23/18 1948 Oral     SpO2 11/23/18 1948 100 %    Constitutional: Alert and oriented. Well appearing and in no acute distress. Eyes: Conjunctivae are normal. PERRL. EOMI. Head: Atraumatic. Nose: No congestion/rhinnorhea. Mouth/Throat: Mucous membranes are moist.  Oropharynx non-erythematous. Neck: No stridor.  No meningeal signs.   Cardiovascular: Normal rate, regular rhythm. Good peripheral circulation. Grossly normal heart sounds.   Respiratory: Normal respiratory effort.  No retractions. Lungs CTAB. Gastrointestinal: Soft and nontender. No distention.  Musculoskeletal: No lower extremity tenderness nor edema. No gross deformities of extremities. Neurologic: No altered mental status, able to give full seemingly accurate history.  Face is symmetric, EOM's intact, pupils equal  and reactive, vision intact, tongue and uvula midline without deviation. Upper and Lower extremity motor 5/5, intact pain perception in distal extremities, 2+ reflexes in biceps, patella and achilles tendons. Able to perform finger to nose normal with both hands. Walks without assistance or evident ataxia.   Skin:  Skin is warm, dry and intact. No rash noted.   ____________________________________________   LABS (all labs ordered are listed, but only abnormal results are displayed)  Labs Reviewed  CBC WITH DIFFERENTIAL/PLATELET -  Abnormal; Notable for the following components:      Result Value   Hemoglobin 15.3 (*)    All other components within normal limits  COMPREHENSIVE METABOLIC PANEL - Abnormal; Notable for the following components:   Potassium 3.4 (*)    Creatinine, Ser 1.10 (*)    All other components within normal limits  URINALYSIS, ROUTINE W REFLEX MICROSCOPIC - Abnormal; Notable for the following components:   APPearance HAZY (*)    All other components within normal limits  I-STAT BETA HCG BLOOD, ED ([facility], [facility], [facility] ONLY)   ____________________________________________  EKG   EKG Interpretation  Date/Time:    Ventricular Rate:    PR Interval:    QRS Duration:   QT Interval:    QTC Calculation:   R Axis:     Text Interpretation:         ____________________________________________  RADIOLOGY  No results found.  ____________________________________________   PROCEDURES  Procedure(s) performed:   Procedures   ____________________________________________   INITIAL IMPRESSION / ASSESSMENT AND PLAN / ED COURSE  IIH likely cause. If meningitis, SAH or other acute emergent cause would be picked up on LP. Gave HA cocktail. Started acetazolamide. HA improved. Observed for a few hours and persistently normal vision. Will dc on diamox with neuro follow up for further management.   Pertinent labs & imaging results that were available during my care of the patient were reviewed by me and considered in my medical decision making (see chart for details).  A medical screening exam was performed and I feel the patient has had an appropriate workup for their chief complaint at this time and likelihood of emergent condition existing is low. They have been counseled on decision, discharge, follow up and which symptoms necessitate immediate return to the emergency department. They or their family verbally stated understanding and agreement with plan and discharged in stable condition.    ____________________________________________  FINAL CLINICAL IMPRESSION(S) / ED DIAGNOSES  Final diagnoses:  Idiopathic intracranial hypertension     MEDICATIONS GIVEN DURING THIS VISIT:  Medications  metoCLOPramide (REGLAN) injection 10 mg (10 mg Intravenous Given 11/24/18 0110)  dexamethasone (DECADRON) injection 10 mg (10 mg Intravenous Given 11/24/18 0110)  diphenhydrAMINE (BENADRYL) injection 25 mg (25 mg Intravenous Given 11/24/18 0110)  acetaZOLAMIDE (DIAMOX) injection 500 mg (500 mg Intravenous Given 11/24/18 0107)  sterile water (preservative free) injection (10 mLs  Given 11/24/18 0110)  valproate (DEPACON) 500 mg in dextrose 5 % 50 mL IVPB (0 mg Intravenous Stopped 11/24/18 0540)  magnesium sulfate IVPB 2 g 50 mL (0 g Intravenous Stopped 11/24/18 0441)     NEW OUTPATIENT MEDICATIONS STARTED DURING THIS VISIT:  Discharge Medication List as of 11/24/2018  3:39 AM    START taking these medications   Details  acetaZOLAMIDE (DIAMOX) 250 MG tablet Take 2 tablets (500 mg total) by mouth 2 (two) times daily., Starting Sun 11/24/2018, Normal        Note:  This note was prepared with assistance of Dragon  voice recognition software. Occasional wrong-word or sound-a-like substitutions may have occurred due to the inherent limitations of voice recognition software.   Merrily Pew, MD 11/25/18 [phone]

## 2018-11-30 ENCOUNTER — Emergency Department (HOSPITAL_COMMUNITY)
Admission: EM | Admit: 2018-11-30 | Discharge: 2018-12-01 | Disposition: A | Discharge status: Home / Self Care | Payer: BC Managed Care – PPO | Attending: Emergency Medicine | Admitting: Emergency Medicine

## 2018-11-30 ENCOUNTER — Encounter (HOSPITAL_COMMUNITY): Payer: Self-pay

## 2018-11-30 ENCOUNTER — Other Ambulatory Visit: Payer: Self-pay

## 2018-11-30 DIAGNOSIS — R519 Headache, unspecified: Secondary | ICD-10-CM | POA: Diagnosis not present

## 2018-11-30 DIAGNOSIS — Z79899 Other long term (current) drug therapy: Secondary | ICD-10-CM | POA: Insufficient documentation

## 2018-11-30 DIAGNOSIS — G43109 Migraine with aura, not intractable, without status migrainosus: Secondary | ICD-10-CM

## 2018-11-30 DIAGNOSIS — I10 Essential (primary) hypertension: Secondary | ICD-10-CM | POA: Insufficient documentation

## 2018-11-30 NOTE — ED Triage Notes (Signed)
Patient complains of generalized headache with bilateral eye pain x 2 weeks. Has been seen for same, denies trauma. Patient alert and oriented, had spinal tap last week.

## 2018-12-01 ENCOUNTER — Emergency Department (HOSPITAL_COMMUNITY): Payer: BC Managed Care – PPO

## 2018-12-01 DIAGNOSIS — R519 Headache, unspecified: Secondary | ICD-10-CM | POA: Diagnosis not present

## 2018-12-01 LAB — BASIC METABOLIC PANEL
Anion gap: 10 (ref 5–15)
BUN: 14 mg/dL (ref 6–20)
CO2: 16 mmol/L — ABNORMAL LOW (ref 22–32)
Calcium: 8.8 mg/dL — ABNORMAL LOW (ref 8.9–10.3)
Chloride: 113 mmol/L — ABNORMAL HIGH (ref 98–111)
Creatinine, Ser: 0.9 mg/dL (ref 0.44–1.00)
GFR calc Af Amer: >60 mL/min (ref >60)
GFR calc non Af Amer: >60 mL/min (ref >60)
Glucose, Bld: 92 mg/dL (ref 70–99)
Potassium: 3.6 mmol/L (ref 3.5–5.1)
Sodium: 139 mmol/L (ref 135–145)

## 2018-12-01 LAB — CBC
HCT: 40 % (ref 36.0–46.0)
Hemoglobin: 13 g/dL (ref 12.0–15.0)
MCH: 30.5 pg (ref 26.0–34.0)
MCHC: 32.5 g/dL (ref 30.0–36.0)
MCV: 93.9 fL (ref 80.0–100.0)
Platelets: 248 10*3/uL (ref 150–400)
RBC: 4.26 MIL/uL (ref 3.87–5.11)
RDW: 13.9 % (ref 11.5–15.5)
WBC: 5.5 10*3/uL (ref 4.0–10.5)
nRBC: 0 % (ref 0.0–0.2)

## 2018-12-01 LAB — I-STAT BETA HCG BLOOD, ED (MC, WL, AP ONLY): I-stat hCG, quantitative: <5 m[IU]/mL (ref <5)

## 2018-12-01 MED ORDER — GABAPENTIN 100 MG PO CAPS: 200.0000 mg | ORAL_CAPSULE | Freq: Every day | ORAL | 0 refills | Status: DC

## 2018-12-01 MED ORDER — LORAZEPAM 2 MG/ML IJ SOLN
1.0000 mg | Freq: Once | INTRAMUSCULAR | Status: AC
  Administered 2018-12-01: 1 mg via INTRAVENOUS
  Filled 2018-12-01: qty 1

## 2018-12-01 MED ORDER — SODIUM CHLORIDE 0.9 % IV BOLUS (SEPSIS)
1000.0000 mL | Freq: Once | INTRAVENOUS | Status: AC
  Administered 2018-12-01: 1000 mL via INTRAVENOUS

## 2018-12-01 MED ORDER — MAGNESIUM SULFATE 2 GM/50ML IV SOLN: 2.0000 g | Freq: Once | INTRAVENOUS | Status: DC

## 2018-12-01 MED ORDER — TOPIRAMATE 50 MG PO TABS: 50.0000 mg | ORAL_TABLET | Freq: Every morning | ORAL | 0 refills | Status: DC

## 2018-12-01 MED ORDER — TOPIRAMATE 200 MG PO TABS: 200.0000 mg | ORAL_TABLET | Freq: Every day | ORAL | 0 refills | Status: DC

## 2018-12-01 MED ORDER — VALPROATE SODIUM 500 MG/5ML IV SOLN
500.0000 mg | Freq: Once | INTRAVENOUS | Status: AC
  Administered 2018-12-01: 500 mg via INTRAVENOUS
  Filled 2018-12-01: qty 5

## 2018-12-01 MED ORDER — DIPHENHYDRAMINE HCL 50 MG/ML IJ SOLN
25.0000 mg | Freq: Once | INTRAMUSCULAR | Status: AC
  Administered 2018-12-01: 25 mg via INTRAVENOUS
  Filled 2018-12-01: qty 1

## 2018-12-01 MED ORDER — MAGNESIUM SULFATE 2 GM/50ML IV SOLN
2.0000 g | Freq: Once | INTRAVENOUS | Status: AC
  Administered 2018-12-01: 2 g via INTRAVENOUS
  Filled 2018-12-01: qty 50

## 2018-12-01 MED ORDER — ELETRIPTAN HYDROBROMIDE 40 MG PO TABS: 40.0000 mg | ORAL_TABLET | Freq: Two times a day (BID) | ORAL | 0 refills | Status: DC | PRN

## 2018-12-01 MED ORDER — PROCHLORPERAZINE EDISYLATE 10 MG/2ML IJ SOLN
10.0000 mg | Freq: Once | INTRAMUSCULAR | Status: AC
  Administered 2018-12-01: 10 mg via INTRAVENOUS
  Filled 2018-12-01: qty 2

## 2018-12-01 NOTE — ED Provider Notes (Signed)
TIME SEEN: 3:27 AM  CHIEF COMPLAINT: Headache  HPI: Patient is a 38 year old female with history of migraine headaches since she was 38 years old, PCOS, hypertension, recent diagnosis of intracranial hypertension on acetazolamide who presents to the emergency department with complaints of a diffuse headache that is worse behind both eyes that has been ongoing for over 2 weeks.  This is her third visit to the emergency department for the same.  She states after IV medications her headaches will improve but never completely resolved and then come.  She denies any fevers, neck pain or neck stiffness.  She states she had some numbness throughout her entire right foot for about 30 minutes today that has resolved.  No other numbness or tingling.  She has not noticed any focal weakness.  She states she feels like she has had a hard time getting her words out today.  Patient underwent fluoroscopy guided lumbar puncture on 11/21/2018 and had an opening pressure of 33 cm of water with a closing pressure of 12 cm of water.  She states that after her lumbar puncture she did have slightly increased headaches and vomiting but this has resolved.  She denies that her headaches are positional.  Headaches are worse with sound but no photophobia.  She is on Topamax and gabapentin.  Has been taking Tylenol and Excedrin intermittently for headaches without relief but states she does not take it every day.  States she has been trying to get an appointment with [facility] neurology as well as her PCP with [facility] physicians.  States before the past couple of weeks, her headaches were well controlled although per her neurologist noted in May 2020 it appears she was having 3-4 headaches a month.  ROS: See HPI Constitutional: no fever  Eyes: no drainage  ENT: no runny nose   Cardiovascular:  no chest pain  Resp: no SOB  GI: no vomiting GU: no dysuria Integumentary: no rash  Allergy: no hives  Musculoskeletal: no leg swelling   Neurological: no slurred speech ROS otherwise negative  PAST MEDICAL HISTORY/PAST SURGICAL HISTORY:  Past Medical History:  Diagnosis Date  . Asthma   . Common migraine with intractable migraine 05/11/2016  . Hypertension   . Migraines   . PCOS (polycystic ovarian syndrome)   . Seizures ([place])    c/b anesthesia    MEDICATIONS:  Prior to Admission medications   Medication Sig Start Date End Date Taking? Authorizing Provider  Acetaminophen-Caffeine (EXCEDRIN TENSION HEADACHE) 500-65 MG TABS Take 2 tablets by mouth every 6 (six) hours as needed (headache).    [provider]  acetaZOLAMIDE (DIAMOX) 250 MG tablet Take 2 tablets (500 mg total) by mouth 2 (two) times daily. 11/24/18   Mesner, Corene Cornea, MD  eletriptan (RELPAX) 40 MG tablet Take 1 tablet (40 mg total) by mouth 2 (two) times daily as needed for migraine or headache. Patient not taking: Reported on 11/24/2018 10/26/16   Kathrynn Ducking, MD  gabapentin (NEURONTIN) 100 MG capsule Take 1 capsule by mouth once daily at bedtime 07/04/18   Lomax, Amy, NP  levonorgestrel (MIRENA) 20 MCG/24HR IUD 1 each by Intrauterine route once.    [provider]  ondansetron (ZOFRAN) 4 MG tablet Take 1 tablet (4 mg total) by mouth every 8 (eight) hours as needed for nausea or vomiting. Patient not taking: Reported on 11/24/2018 05/11/16   Kathrynn Ducking, MD  topiramate (TOPAMAX) 200 MG tablet Take one tablet at bedtime Patient taking differently: Take 200 mg  by mouth at bedtime.  07/04/18   Lomax, Amy, NP    ALLERGIES:  Allergies  Allergen Reactions  . Amoxicillin Hives and Nausea And Vomiting    Has patient had a PCN reaction causing immediate rash, facial/tongue/throat swelling, SOB or lightheadedness with hypotension: Yes Has patient had a PCN reaction causing severe rash involving mucus membranes or skin necrosis: No Has patient had a PCN reaction that required hospitalization: No Has patient had a PCN reaction occurring  within the last 10 years: No If all of the above answers are "NO", then may proceed with Cephalosporin use.   . Aspirin Hives and Nausea And Vomiting  . Motrin [Ibuprofen] Hives and Nausea And Vomiting    SOCIAL HISTORY:  Social History   Tobacco Use  . Smoking status: Never Smoker  . Smokeless tobacco: Never Used  Substance Use Topics  . Alcohol use: Yes    Comment: occasionally    FAMILY HISTORY: Family History  Problem Relation Age of Onset  . Breast cancer Maternal Aunt   . Diabetes Maternal Grandmother   . Diabetes Paternal Grandfather   . Cancer Maternal Aunt        ovarian    EXAM: BP (!) 156/93   Pulse (!) 57   Temp 98.4 F (36.9 C) (Oral)   Resp 16   LMP 11/10/2018   SpO2 100%  CONSTITUTIONAL: Alert and oriented and responds appropriately to questions.  Appears uncomfortable, afebrile HEAD: Normocephalic EYES: Conjunctivae clear, pupils appear equal, EOMI ENT: normal nose; moist mucous membranes NECK: Supple, no meningismus, no nuchal rigidity, no LAD  CARD: RRR; S1 and S2 appreciated; no murmurs, no clicks, no rubs, no gallops RESP: Normal chest excursion without splinting or tachypnea; breath sounds clear and equal bilaterally; no wheezes, no rhonchi, no rales, no hypoxia or respiratory distress, speaking full sentences ABD/GI: Normal bowel sounds; non-distended; soft, non-tender, no rebound, no guarding, no peritoneal signs, no hepatosplenomegaly BACK:  The back appears normal and is non-tender to palpation, there is no CVA tenderness EXT: Normal ROM in all joints; non-tender to palpation; no edema; normal capillary refill; no cyanosis, no calf tenderness or swelling    SKIN: Normal color for age and race; warm; no rash NEURO: Moves all extremities equally, slightly weaker in the left upper and lower extremity compared to the right, cranial nerves II through XII intact, normal speech, normal sensation diffusely, speaks very quietly PSYCH: The patient's mood  and manner are appropriate. Grooming and personal hygiene are appropriate.  MEDICAL DECISION MAKING: Patient here with continued headaches.  It appears on previous visit she has received IV fluids, Reglan, Benadryl, Decadron, Depakote, magnesium, acetazolamide.  Headaches will improve briefly but then returned.  I worry that she may be having some rebound headaches.  I discussed the case with Dr. Rory Percy who recommends an MRI of the brain without contrast given she has never had an MRI of her brain and she has some left-sided weakness today.  We will start with IV fluids, Compazine, Benadryl.  Dr. Rory Percy recommends increasing her gabapentin to 200 mg at bedtime and her starting Topamax 50 mg in the daytime in addition to her 200 mg that she takes at night.  He also recommends that we send an ambulatory referral to interventional radiology for a repeat lumbar puncture next week.  Does not feel this needs to be done emergently given she is not having vision changes currently.  ED PROGRESS: Spoke to patient's aunt by phone.  She is concerned  that the acetazolamide is what is making patient's symptoms worse.  She is requesting that lab work be done today.  Basic labs have been ordered in case patient needs admission into evaluate for anemia, electrolyte derangement which could be contributing to any of her symptoms.  5:15 AM  Pt's labs are unremarkable other than a metabolic acidosis with normal anion gap.  She does have an elevated chloride level.  Will hold on any further normal saline at this time.  Patient still states her headache is a 6/10.  Will give Depakote, magnesium.  7:10 AM  Pt's HA now 2/10 she is very drowsy after Ativan and Benadryl.  Will continue to be monitored in the ED but anticipate discharge home with outpatient neurology follow-up and follow-up with interventional radiology for repeat lumbar puncture.  At this time, I do not feel there is any life-threatening condition present. I have  reviewed and discussed all results (EKG, imaging, lab, urine as appropriate) and exam findings with patient/family. I have reviewed nursing notes and appropriate previous records.  I feel the patient is safe to be discharged home without further emergent workup and can continue workup as an outpatient as needed. Discussed usual and customary return precautions. Patient/family verbalize understanding and are comfortable with this plan.  Outpatient follow-up has been provided as needed. All questions have been answered.   [name] was evaluated in Emergency Department on 12/01/2018 for the symptoms described in the history of present illness. She was evaluated in the context of the global COVID-19 pandemic, which necessitated consideration that the patient might be at risk for infection with the SARS-CoV-2 virus that causes COVID-19. Institutional protocols and algorithms that pertain to the evaluation of patients at risk for COVID-19 are in a state of rapid change based on information released by regulatory bodies including the CDC and federal and state organizations. These policies and algorithms were followed during the patient's care in the ED.    Makaila Windle, Delice Bison, DO 12/01/18 [phone]

## 2018-12-01 NOTE — ED Provider Notes (Signed)
  Provider Note MRN:  [id number]  Arrival date & time: 12/01/18    ED Course and Medical Decision Making  Assumed care from Dr. Leonides Schanz at shift change.  History of migraines, recent diagnosis of intracranial hypertension, receiving IV Depakote, neurology consulted, patient appropriate for outpatient repeat LP.  Anticipating discharge.  8 AM update: Patient is feeling better and states that she feels well enough for discharge, will follow up with IR for repeat lumbar puncture as an outpatient.  Final Clinical Impressions(s) / ED Diagnoses  No diagnosis found.  ED Discharge Orders         Ordered    Ambulatory referral to Interventional Radiology    Comments: Repeat lumbar puncture for continued headaches with history of intracranial hypertension   12/01/18 0345          Discharge Instructions   None     Barth Kirks. Sedonia Small, [place] [email or web address]    Maudie Flakes, MD 12/01/18 [phone]

## 2018-12-01 NOTE — Discharge Instructions (Signed)
I recommend that you follow-up with interventional radiology at the beginning of the week to have a repeat lumbar puncture.  A referral has been placed.  If you develop symptoms of changes in your vision before you are able to have this lumbar puncture performed as an outpatient, I recommend that she return to the emergency department.  We have discussed your case with the neurologist on-call and they recommend increasing your gabapentin to 200 mg at night and to begin taking Topamax 50 mg in the morning and continue Topamax 200 mg at night.  I have also refilled your Relpax to take as needed for headaches.  I recommend at this time you avoid Tylenol and Excedrin as these may be leading to rebound headaches.  I recommend close follow-up with your neurologist.  Your brain MRI was normal today.

## 2018-12-01 NOTE — ED Notes (Signed)
Patient transported to MRI 

## 2018-12-01 NOTE — ED Notes (Signed)
Patient transported to Ultrasound 

## 2018-12-02 NOTE — Progress Notes (Addendum)
PATIENT: [name] DOB: 25-Dec-1980  REASON FOR VISIT: follow up HISTORY FROM: patient  Chief Complaint  Patient presents with  . Follow-up    Migraine f/u. Husband present. Rm 1. Patient mentioned that she has been to ER 3 times in the last week.      HISTORY OF PRESENT ILLNESS: Today 12/03/18 [name] is a 38 y.o. female here today for follow up for headaches with new diagnosis of IIH. She has been seen several times in the ER for worsening headaches since being seen by me in 06/2018. At that time, she reported that headaches were doing well on topiramate and gabapentin. Relpax worked well for abortive therapy. She started having worsening of central headaches with pressure behind her eyes about 2-3 weeks ago. She reports that her headache was persistent and unresponsive to therapy. Pressure behind right eye with peripheral vision loss. Last week, LP showed opening pressure of 30. Closing pressure of 12 and headache improved. She was started on Diamox 500mg  twice daily and referred back to [place]. She has tolerated Diamox without adverse effects over the past 10 days. She has continued topiramate 200mg  and gabapentin 100mg  at bedtime. MRI was normal. She has noticed more trouble with her memory. She feels that her words are not coming to her. She has ringing in her ears. She is nauseated at times. She has not had an eye exam. She has Mirena for birth control placed about a year ago. Twin sister was diagnosed with IIH about 10 years ago.    HISTORY: (copied from my note on 07/04/2018)  [name] is a 38 y.o. female for follow up of migraines. She is taking topirimate 200mg  and gabapentin 100mg  at bedtime. She uses Relpax for abortive therapy. She does report sleepiness with Relpax. She has had about 3 migraines in the past year. She has 3-4 headaches per month. She feels that she is doing really well.    HISTORY (copied from [facility] note on 10/29/2017  Ms. Hughesis a  38 year old female with a history of migraine headaches. She returns today for follow-up. She states overall her migraine headaches are better. She has not had a migraine in the last month. She states that she is not headaches at least 3 times a week. She typically can take Tylenol or Excedrin tension. She reports that she does not use Relpax as much because she needs to lay downafter she takes it. She states that she works different shifts at her job which also may be affecting her sleep.   HISTORY03/14/19 Ms.Hughesis a 38 year old female with a history of migraine. She is currently taking 200 mg at bedtime. She uses Relpax to treat her acute migraines. She reports that she hashad a ongoing headache for the last 3 weeks. She reports that her pain fluctuates from a 4 out of 10 to a 7 out of 10. She reports that the headache has been constant. She has used Tylenol as well as Relpax. She reports that these medications has decreased the severity but essentially did not resolve the headache. She reports theheadache usuallystartsin the center of the head and radiatesdown to the forehead. She reports with severe headachesshe will have photophobia. She reports occasionally she will have halos in her vision. She confirmed that she is also had nausea and vomiting. She has been using Zofran. In the past she has had a prednisone Dosepak for that it works well for her headache. She returns today for evaluation.   REVIEW OF  SYSTEMS: Out of a complete 14 system review of symptoms, the patient complains only of the following symptoms, memory loss, headaches, speech difficulty and all other reviewed systems are negative.  ALLERGIES: Allergies  Allergen Reactions  . Amoxicillin Hives and Nausea And Vomiting    Has patient had a PCN reaction causing immediate rash, facial/tongue/throat swelling, SOB or lightheadedness with hypotension: Yes Has patient had a PCN reaction causing severe  rash involving mucus membranes or skin necrosis: No Has patient had a PCN reaction that required hospitalization: No Has patient had a PCN reaction occurring within the last 10 years: No If all of the above answers are "NO", then may proceed with Cephalosporin use.   . Aspirin Hives and Nausea And Vomiting  . Motrin [Ibuprofen] Hives and Nausea And Vomiting    HOME MEDICATIONS: Outpatient Medications Prior to Visit  Medication Sig Dispense Refill  . Acetaminophen-Caffeine (EXCEDRIN TENSION HEADACHE) 500-65 MG TABS Take 2 tablets by mouth every 6 (six) hours as needed (headache).    . eletriptan (RELPAX) 40 MG tablet Take 1 tablet (40 mg total) by mouth 2 (two) times daily as needed for migraine or headache. 10 tablet 0  . gabapentin (NEURONTIN) 100 MG capsule Take 2 capsules (200 mg total) by mouth at bedtime. 60 capsule 0  . levonorgestrel (MIRENA) 20 MCG/24HR IUD 1 each by Intrauterine route once.    . topiramate (TOPAMAX) 200 MG tablet Take 1 tablet (200 mg total) by mouth at bedtime. 30 tablet 0  . valACYclovir (VALTREX) 500 MG tablet Take 500 mg by mouth 2 (two) times daily as needed (as needed for lesions).    Marland Kitchen acetaZOLAMIDE (DIAMOX) 250 MG tablet Take 2 tablets (500 mg total) by mouth 2 (two) times daily. 120 tablet 1  . ondansetron (ZOFRAN) 4 MG tablet Take 1 tablet (4 mg total) by mouth every 8 (eight) hours as needed for nausea or vomiting. 30 tablet 3  . topiramate (TOPAMAX) 50 MG tablet Take 1 tablet (50 mg total) by mouth every morning. 30 tablet 0   No facility-administered medications prior to visit.     PAST MEDICAL HISTORY: Past Medical History:  Diagnosis Date  . Asthma   . Common migraine with intractable migraine 05/11/2016  . Hypertension   . Migraines   . PCOS (polycystic ovarian syndrome)   . Seizures ([place])    c/b anesthesia    PAST SURGICAL HISTORY: Past Surgical History:  Procedure Laterality Date  . CESAREAN SECTION     one previous  . HERNIA REPAIR     . NASAL RECONSTRUCTION      FAMILY HISTORY: Family History  Problem Relation Age of Onset  . Breast cancer Maternal Aunt   . Diabetes Maternal Grandmother   . Diabetes Paternal Grandfather   . Cancer Maternal Aunt        ovarian    SOCIAL HISTORY: Social History   Socioeconomic History  . Marital status: Married    Spouse name: Not on file  . Number of children: Not on file  . Years of education: Not on file  . Highest education level: Not on file  Occupational History  . Not on file  Social Needs  . Financial resource strain: Not on file  . Food insecurity    Worry: Not on file    Inability: Not on file  . Transportation needs    Medical: Not on file    Non-medical: Not on file  Tobacco Use  . Smoking status: Never  Smoker  . Smokeless tobacco: Never Used  Substance and Sexual Activity  . Alcohol use: Yes    Comment: occasionally  . Drug use: No  . Sexual activity: Yes    Birth control/protection: Pill  Lifestyle  . Physical activity    Days per week: Not on file    Minutes per session: Not on file  . Stress: Not on file  Relationships  . Social Herbalist on phone: Not on file    Gets together: Not on file    Attends religious service: Not on file    Active member of club or organization: Not on file    Attends meetings of clubs or organizations: Not on file    Relationship status: Not on file  . Intimate partner violence    Fear of current or ex partner: Not on file    Emotionally abused: Not on file    Physically abused: Not on file    Forced sexual activity: Not on file  Other Topics Concern  . Not on file  Social History Narrative  . Not on file    PHYSICAL EXAM  Vitals:   12/03/18 0746  BP: 124/79  Pulse: (!) 57  Temp: 97.6 F (36.4 C)  Weight: 147 lb 3.2 oz (66.8 kg)  Height: 5' 6.5" (1.689 m)   Body mass index is 23.4 kg/m.  Generalized: Well developed, in no acute distress  Neurological examination  Mentation:  Alert oriented to time, place, history taking. Follows all commands speech and language fluent Cranial nerve II-XII: Pupils were equal round reactive to light. Unable to visualize optic disc in office. Extraocular movements were full, visual field intact with exception of right upper field. Facial sensation and strength were normal. Uvula tongue midline. Head turning and shoulder shrug  were normal and symmetric. Motor: The motor testing reveals 5 over 5 strength of all 4 extremities. Good symmetric motor tone is noted throughout.  Sensory: Sensory testing is intact to soft touch on all 4 extremities. No evidence of extinction is noted.  Coordination: Cerebellar testing reveals good finger-nose-finger and heel-to-shin bilaterally.  Gait and station: Gait is normal.  Reflexes: Deep tendon reflexes are symmetric and normal bilaterally.   DIAGNOSTIC DATA (LABS, IMAGING, TESTING) - I reviewed patient records, labs, notes, testing and imaging myself where available.  No flowsheet data found.   Lab Results  Component Value Date   WBC 5.5 12/01/2018   HGB 13.0 12/01/2018   HCT 40.0 12/01/2018   MCV 93.9 12/01/2018   PLT 248 12/01/2018      Component Value Date/Time   NA 139 12/01/2018 0406   K 3.6 12/01/2018 0406   CL 113 (H) 12/01/2018 0406   CO2 16 (L) 12/01/2018 0406   GLUCOSE 92 12/01/2018 0406   BUN 14 12/01/2018 0406   CREATININE 0.90 12/01/2018 0406   CALCIUM 8.8 (L) 12/01/2018 0406   PROT 6.7 11/23/2018 2004   ALBUMIN 4.2 11/23/2018 2004   AST 16 11/23/2018 2004   ALT 17 11/23/2018 2004   ALKPHOS 49 11/23/2018 2004   BILITOT 0.9 11/23/2018 2004   GFRNONAA >60 12/01/2018 0406   GFRAA >60 12/01/2018 0406   No results found for: CHOL, HDL, LDLCALC, LDLDIRECT, TRIG, CHOLHDL No results found for: HGBA1C No results found for: VITAMINB12 No results found for: TSH     ASSESSMENT AND PLAN 38 y.o. year old female  has a past medical history of Asthma, Common migraine with  intractable migraine (05/11/2016),  Hypertension, Migraines, PCOS (polycystic ovarian syndrome), and Seizures ([place]). here with     ICD-10-CM   1. IIH (idiopathic intracranial hypertension)  G93.2     Mahsa was recently diagnosed with IH following a 2-week stent of worsening headaches, pressure behind her eyes, right-sided eye pain and right-sided peripheral vision loss.  Symptoms have improved since starting Diamox.  She has tolerated 500 mg twice daily with no obvious adverse effects.  Headaches and mild eye pain continues.  I am unable to visualize the optic disc in the office today.  We will increase Diamox to 750 mg twice daily.  She will continue topiramate 200 mg and gabapentin 100 mg at bedtime.  She will continue Relpax for treatment of migrainous headaches.  Zofran refilled for concerns of intermittent nausea.  We have discussed the diagnosis of IIH and its relationship to her headaches as well as concerns of peripheral vision loss.  I have relayed to her that it is imperative we get an eye exam scheduled soon as possible.  She will call today to schedule.  I have asked that she have exam notes faxed to me for review.  I have also asked that she discuss alternative birth control options with her GYN provider.  I have provided additional educational material on IIH in her AVS today. She will follow-up with me in the office in 6 weeks, sooner if needed.  She was advised to reach out to me on my chart with any concerns or questions.  She verbalizes understanding and agreement with this plan.   No orders of the defined types were placed in this encounter.    Meds ordered this encounter  Medications  . acetaZOLAMIDE (DIAMOX) 250 MG tablet    Sig: Take 3 tablets (750 mg total) by mouth 2 (two) times daily.    Dispense:  180 tablet    Refill:  11    Order Specific Question:   Supervising Provider    Answer:   Melvenia Beam [id number]  . ondansetron (ZOFRAN) 4 MG tablet    Sig: Take 1 tablet (4  mg total) by mouth every 8 (eight) hours as needed for nausea or vomiting.    Dispense:  30 tablet    Refill:  3    Order Specific Question:   Supervising Provider    Answer:   Melvenia Beam [id number]      I spent 45 minutes with the patient. 50% of this time was spent counseling and educating patient on plan of care and medications.    [name], FNP-C 12/03/2018, 8:35 AM [facility] [address] [place], [place] [address] [phone]  Made any corrections needed, and agree with history, physical, neuro exam,assessment and plan as stated.     Sarina Ill, MD [facility]

## 2018-12-03 ENCOUNTER — Ambulatory Visit: Payer: BC Managed Care – PPO | Admitting: Family Medicine

## 2018-12-03 ENCOUNTER — Telehealth: Payer: Self-pay | Admitting: Family Medicine

## 2018-12-03 ENCOUNTER — Encounter: Payer: Self-pay | Admitting: Family Medicine

## 2018-12-03 ENCOUNTER — Other Ambulatory Visit: Payer: Self-pay

## 2018-12-03 VITALS — BP 124/79 | HR 57 | Temp 97.6°F | Ht 66.5 in | Wt 147.2 lb

## 2018-12-03 DIAGNOSIS — G932 Benign intracranial hypertension: Secondary | ICD-10-CM | POA: Diagnosis not present

## 2018-12-03 DIAGNOSIS — H52223 Regular astigmatism, bilateral: Secondary | ICD-10-CM | POA: Diagnosis not present

## 2018-12-03 DIAGNOSIS — D485 Neoplasm of uncertain behavior of skin: Secondary | ICD-10-CM | POA: Diagnosis not present

## 2018-12-03 MED ORDER — ACETAZOLAMIDE 250 MG PO TABS: 750.0000 mg | ORAL_TABLET | Freq: Two times a day (BID) | ORAL | 11 refills | Status: DC

## 2018-12-03 MED ORDER — ONDANSETRON HCL 4 MG PO TABS: 4.0000 mg | ORAL_TABLET | Freq: Three times a day (TID) | ORAL | 3 refills | Status: DC | PRN

## 2018-12-03 NOTE — Telephone Encounter (Signed)
Pt called and stated that her pharmacy did not get the refill request for her eletriptan (RELPAX) 40 MG tablet  She is needing this filled at [facility] on [address].

## 2018-12-03 NOTE — Telephone Encounter (Signed)
Pcp, Dr. Leonides Schanz did prescription for pt (printed) on 12-01-18 for her relpax.  I LMVM for pt about this to call there office.

## 2018-12-03 NOTE — Patient Instructions (Addendum)
  Continue topiramate 200mg  and gabapentin 100mg  daily at bedtime  Increase Diamox to 750mg  (3 tablets) twice daily, call with side effects   Please call to set up eye exam today  Call GYN provider to discuss alternate non hormonal birth control options.  Follow up with me in 6 weeks    Idiopathic Intracranial Hypertension  Idiopathic intracranial hypertension (IIH) is a condition that increases pressure around the brain. The fluid that surrounds the brain and spinal cord (cerebrospinal fluid, CSF) increases and causes the pressure. Idiopathic means that the cause of this condition is not known. IIH affects the brain and spinal cord (is a neurological disorder). If this condition is not treated, it can cause vision loss or blindness. What increases the risk? You are more likely to develop this condition if:  You are severely overweight (obese).  You are a woman who has not gone through menopause.  You take certain medicines, such as birth control or steroids. What are the signs or symptoms? Symptoms of IIH include:  Headaches. This is the most common symptom.  Pain in the shoulders or neck.  Nausea and vomiting.  A "rushing water" or pulsing sound within the ears (pulsatile tinnitus).  Double vision.  Blurred vision.  Brief episodes of complete vision loss. How is this diagnosed? This condition may be diagnosed based on:  Your symptoms.  Your medical history.  CT scan of the brain.  MRI of the brain.  Magnetic resonance venogram (MRV) to check veins in the brain.  Diagnostic lumbar puncture. This is a procedure to remove and examine a sample of cerebrospinal fluid. This procedure can determine whether too much fluid may be causing IIH.  A thorough eye exam to check for swelling or nerve damage in the eyes. How is this treated? Treatment for this condition depends on your symptoms. The goal of treatment is to decrease the pressure around your brain. Common  treatments include:  Medicines to decrease the production of spinal fluid and lower the pressure within your skull.  Medicines to prevent or treat headaches.  Surgery to place drains (shunts) in your brain to remove excess fluid.  Lumbar puncture to remove excess cerebrospinal fluid. Follow these instructions at home:  If you are overweight or obese, work with your health care provider to lose weight.  Take over-the-counter and prescription medicines only as told by your health care provider.  Do not drive or use heavy machinery while taking medicines that can make you sleepy.  Keep all follow-up visits as told by your health care provider. This is important. Contact a health care provider if:  You have changes in your vision, such as: ? Double vision. ? Not being able to see colors (color vision). Get help right away if:  You have any of the following symptoms and they get worse or do not get better. ? Headaches. ? Nausea. ? Vomiting. ? Vision changes or difficulty seeing. Summary  Idiopathic intracranial hypertension (IIH) is a condition that increases pressure around the brain. The cause is not known (is idiopathic).  The most common symptom of IIH is headaches.  Treatment may include medicines or surgery to relieve the pressure on your brain. This information is not intended to replace advice given to you by your health care provider. Make sure you discuss any questions you have with your health care provider. Document Released: 04/03/2001 Document Revised: 01/05/2017 Document Reviewed: 12/15/2015 Elsevier Patient Education  2020 [employer].

## 2018-12-04 ENCOUNTER — Other Ambulatory Visit: Payer: Self-pay | Admitting: *Deleted

## 2018-12-04 MED ORDER — ELETRIPTAN HYDROBROMIDE 40 MG PO TABS: ORAL_TABLET | ORAL | 0 refills | Status: DC

## 2018-12-05 ENCOUNTER — Telehealth: Payer: Self-pay | Admitting: Family Medicine

## 2018-12-05 DIAGNOSIS — Z975 Presence of (intrauterine) contraceptive device: Secondary | ICD-10-CM | POA: Diagnosis not present

## 2018-12-05 DIAGNOSIS — G932 Benign intracranial hypertension: Secondary | ICD-10-CM | POA: Diagnosis not present

## 2018-12-05 DIAGNOSIS — N939 Abnormal uterine and vaginal bleeding, unspecified: Secondary | ICD-10-CM | POA: Diagnosis not present

## 2018-12-05 NOTE — Telephone Encounter (Signed)
Please field call for me.

## 2018-12-05 NOTE — Telephone Encounter (Signed)
I called back and she really wanted to speak to AL/NP.

## 2018-12-05 NOTE — Telephone Encounter (Signed)
Please let her know that there is some concern that hormonal birth control can increase risk for IIH. She needs to discuss risks and benefits of continuing Mirena with her GYN provider. We had a lengthy discussion regarding this in the office.

## 2018-12-05 NOTE — Telephone Encounter (Signed)
Phone rep checked office voicemail; Dr Angelia Mould is asking for a call from NP Amy Lomax re: pt. this voicemail was left @1 :21 this afternoon

## 2018-12-05 NOTE — Telephone Encounter (Signed)
I called Dr. Louann Liv. She is ob/gyn.  Pt has had mirena since 10/2017.  She was placed on this for horrible periods not necessarily for [facility].  She saw pt and and was not able to understand from pt why she needed to come off.  Please call her back anytime.  I ready from your note what you stated re: BC.

## 2018-12-09 NOTE — Telephone Encounter (Signed)
Dr Jaynee Eagles, this is the patient I asked you about over the weekend. I have spoken to Dr Louann Liv regarding the need to consider alternate, non hormonal forms of birth control options. Patient has Mirena placed for dysmennorrhea. We have discussed that in her case, this may be a risk/benefit situation. Any other suggestions for OB/GYN?

## 2018-12-19 DIAGNOSIS — C44702 Unspecified malignant neoplasm of skin of right lower limb, including hip: Secondary | ICD-10-CM | POA: Diagnosis not present

## 2019-01-06 DIAGNOSIS — R911 Solitary pulmonary nodule: Secondary | ICD-10-CM | POA: Diagnosis not present

## 2019-01-06 DIAGNOSIS — C44702 Unspecified malignant neoplasm of skin of right lower limb, including hip: Secondary | ICD-10-CM | POA: Diagnosis not present

## 2019-01-15 ENCOUNTER — Other Ambulatory Visit: Payer: Self-pay

## 2019-01-15 ENCOUNTER — Ambulatory Visit (HOSPITAL_COMMUNITY)
Admission: EM | Admit: 2019-01-15 | Discharge: 2019-01-15 | Disposition: A | Discharge status: Home / Self Care | Payer: BC Managed Care – PPO | Attending: Family Medicine | Admitting: Family Medicine

## 2019-01-15 DIAGNOSIS — R519 Headache, unspecified: Secondary | ICD-10-CM | POA: Diagnosis not present

## 2019-01-15 MED ORDER — METOCLOPRAMIDE HCL 5 MG/ML IJ SOLN
5.0000 mg | Freq: Once | INTRAMUSCULAR | Status: AC
  Administered 2019-01-15: 5 mg via INTRAMUSCULAR

## 2019-01-15 MED ORDER — DEXAMETHASONE SODIUM PHOSPHATE 10 MG/ML IJ SOLN
10.0000 mg | Freq: Once | INTRAMUSCULAR | Status: AC
  Administered 2019-01-15: 10 mg via INTRAMUSCULAR

## 2019-01-15 MED ORDER — METOCLOPRAMIDE HCL 5 MG/ML IJ SOLN
INTRAMUSCULAR | Status: AC
  Filled 2019-01-15: qty 2

## 2019-01-15 MED ORDER — DEXAMETHASONE SODIUM PHOSPHATE 10 MG/ML IJ SOLN
INTRAMUSCULAR | Status: AC
  Filled 2019-01-15: qty 1

## 2019-01-15 NOTE — Discharge Instructions (Signed)
Keep your follow up appointment with your neurologist tomorrow morning.

## 2019-01-15 NOTE — ED Triage Notes (Signed)
Pt c/o intermittent headaches onset 3 days associated w/right eye tenderness  Hx of migraines   A&O x4.. no acute distress... wheelchair

## 2019-01-16 ENCOUNTER — Encounter: Payer: Self-pay | Admitting: Family Medicine

## 2019-01-16 ENCOUNTER — Ambulatory Visit (INDEPENDENT_AMBULATORY_CARE_PROVIDER_SITE_OTHER): Payer: BC Managed Care – PPO | Admitting: Family Medicine

## 2019-01-16 VITALS — BP 140/77 | HR 68 | Temp 98.0°F | Ht 66.5 in | Wt 142.2 lb

## 2019-01-16 DIAGNOSIS — G932 Benign intracranial hypertension: Secondary | ICD-10-CM | POA: Diagnosis not present

## 2019-01-16 NOTE — ED Provider Notes (Signed)
[facility]   [id number] 01/15/19 Arrival Time: [id number]  ASSESSMENT & PLAN:  1. Acute intractable headache, unspecified headache type      Meds ordered this encounter  Medications  . metoCLOPramide (REGLAN) injection 5 mg  . dexamethasone (DECADRON) injection 10 mg   Complicated medical history. She is not the best historian. Normal neurological exam. Afebrile without nuchal rigidity. Discussed that I have limited resources here that may help her headache. She would like trial of medications above. Current presentation and symptoms are consistent with prior headaches. Without fever, focal neuro logical deficits, nuchal rigidity, or change in vision I see no indication for emergent neurodiagnostic workup at this time. Discussed. She agrees to ED f/u should her symptoms worsen overnight.  Recommend: Follow-up Information    [facility].   Specialty: Emergency Medicine Why: If symptoms worsen in any way. Contact information: [address] [id number] [place] [phone]          Reviewed expectations re: course of current medical issues. Questions answered. Outlined signs and symptoms indicating need for more acute intervention. Patient verbalized understanding. After Visit Summary given.   SUBJECTIVE: History from: patient. Patient is able to give a clear and coherent history.  [name] is a 38 y.o. female who presents with complaint of a bad headache. PMH includes migraines and diagnosis of intracranial hypertension. She reports that she is to see her neurologist tomorrow morning. Taking medications as prescribed. Reports generalized non-radiating headache; onset gradual, over the past 2-3 days. Describes as dull. Precipitating factors include: none which have been determined. Associated symptoms: Preceding aura: no. Nausea/vomiting: mild nausea without emesis; no nausea currently.  Vision changes: no. Increased sensitivity to light and to noises: mild. Fever: no. Sinus pressure/congestion: no. Extremity weakness: no. "But I do feel weak". Home treatment has included Tylenol with little improvement. Current headache has limited normal daily activities. Denies depression, dizziness, loss of balance, numbness of extremities, speech difficulties and vision problems. No seizures. Reports normal bowel/bladder habits. No head injury reported. Ambulatory without difficulty. No recent travel. Denies street drug use. No head injuries reported.  ROS: As per HPI. All other systems negative.    OBJECTIVE:  Vitals:   01/15/19 1838  BP: (!) 141/77  Pulse: 62  Resp: 16  Temp: 98.6 F (37 C)  TempSrc: Oral  SpO2: 100%    General appearance: alert; NAD but appears fatigued/drowsy HENT: normocephalic; atraumatic Eyes: PERRLA; EOMI; conjunctivae normal Neck: supple with FROM Lungs: clear to auscultation bilaterally; unlabored respirations Heart: regular rate and rhythm Extremities: no edema; symmetrical with no gross deformities Skin: warm and dry Neurologic: alert; speech is clear without dysarthria or aphasia; CN 2-12 grossly intact; no facial droop; normal but slow gait; normal symmetric reflexes; normal extremity strength and sensation throughout; bilateral upper and lower extremity sensation is grossly intact with 5/5 symmetric strength; normal grip strength Psychological: alert and cooperative; normal mood and affect   Allergies  Allergen Reactions  . Amoxicillin Hives and Nausea And Vomiting    Has patient had a PCN reaction causing immediate rash, facial/tongue/throat swelling, SOB or lightheadedness with hypotension: Yes Has patient had a PCN reaction causing severe rash involving mucus membranes or skin necrosis: No Has patient had a PCN reaction that required hospitalization: No Has patient had a PCN reaction occurring within the last 10 years: No If all of  the above answers are "NO", then may proceed with Cephalosporin use.   . Aspirin  Hives and Nausea And Vomiting  . Motrin [Ibuprofen] Hives and Nausea And Vomiting    Past Medical History:  Diagnosis Date  . Asthma   . Common migraine with intractable migraine 05/11/2016  . Hypertension   . Migraines   . PCOS (polycystic ovarian syndrome)   . Seizures ([place])    c/b anesthesia   Social History   Socioeconomic History  . Marital status: Married    Spouse name: Not on file  . Number of children: Not on file  . Years of education: Not on file  . Highest education level: Not on file  Occupational History  . Not on file  Tobacco Use  . Smoking status: Never Smoker  . Smokeless tobacco: Never Used  Substance and Sexual Activity  . Alcohol use: Yes    Comment: occasionally  . Drug use: No  . Sexual activity: Yes    Birth control/protection: Pill  Other Topics Concern  . Not on file  Social History Narrative  . Not on file   Social Determinants of Health   Financial Resource Strain:   . Difficulty of Paying Living Expenses: Not on file  Food Insecurity:   . Worried About Charity fundraiser in the Last Year: Not on file  . Ran Out of Food in the Last Year: Not on file  Transportation Needs:   . Lack of Transportation (Medical): Not on file  . Lack of Transportation (Non-Medical): Not on file  Physical Activity:   . Days of Exercise per Week: Not on file  . Minutes of Exercise per Session: Not on file  Stress:   . Feeling of Stress : Not on file  Social Connections:   . Frequency of Communication with Friends and Family: Not on file  . Frequency of Social Gatherings with Friends and Family: Not on file  . Attends Religious Services: Not on file  . Active Member of Clubs or Organizations: Not on file  . Attends Archivist Meetings: Not on file  . Marital Status: Not on file  Intimate Partner Violence:   . Fear of Current or Ex-Partner: Not on file  .  Emotionally Abused: Not on file  . Physically Abused: Not on file  . Sexually Abused: Not on file   Family History  Problem Relation Age of Onset  . Breast cancer Maternal Aunt   . Diabetes Maternal Grandmother   . Diabetes Paternal Grandfather   . Cancer Maternal Aunt        ovarian   Past Surgical History:  Procedure Laterality Date  . CESAREAN SECTION     one previous  . HERNIA REPAIR    . NASAL RECONSTRUCTION       Vanessa Kick, MD 01/16/19 [phone]

## 2019-01-16 NOTE — Patient Instructions (Addendum)
Please continue Diamox 750mg  twice daily, topiramate 200mg  and gabapentin 200mg  at bedtime.   Take Relpax (1 tablet), Zofran (1 tablet) and Benadryl (25-50mg ) when you get home  Drink water and rest all day today.   Please seek medical attention immediately for any worsening headaches or vision changes.   Follow up with me in 3-6 months     Migraine Headache A migraine headache is a very strong throbbing pain on one side or both sides of your head. This type of headache can also cause other symptoms. It can last from 4 hours to 3 days. Talk with your doctor about what things may bring on (trigger) this condition. What are the causes? The exact cause of this condition is not known. This condition may be triggered or caused by:  Drinking alcohol.  Smoking.  Taking medicines, such as: ? Medicine used to treat chest pain (nitroglycerin). ? Birth control pills. ? Estrogen. ? Some blood pressure medicines.  Eating or drinking certain products.  Doing physical activity. Other things that may trigger a migraine headache include:  Having a menstrual period.  Pregnancy.  Hunger.  Stress.  Not getting enough sleep or getting too much sleep.  Weather changes.  Tiredness (fatigue). What increases the risk?  Being 25-64 years old.  Being female.  Having a family history of migraine headaches.  Being Caucasian.  Having depression or anxiety.  Being very overweight. What are the signs or symptoms?  A throbbing pain. This pain may: ? Happen in any area of the head, such as on one side or both sides. ? Make it hard to do daily activities. ? Get worse with physical activity. ? Get worse around bright lights or loud noises.  Other symptoms may include: ? Feeling sick to your stomach (nauseous). ? Vomiting. ? Dizziness. ? Being sensitive to bright lights, loud noises, or smells.  Before you get a migraine headache, you may get warning signs (an aura). An aura  may include: ? Seeing flashing lights or having blind spots. ? Seeing bright spots, halos, or zigzag lines. ? Having tunnel vision or blurred vision. ? Having numbness or a tingling feeling. ? Having trouble talking. ? Having weak muscles.  Some people have symptoms after a migraine headache (postdromal phase), such as: ? Tiredness. ? Trouble thinking (concentrating). How is this treated?  Taking medicines that: ? Relieve pain. ? Relieve the feeling of being sick to your stomach. ? Prevent migraine headaches.  Treatment may also include: ? Having acupuncture. ? Avoiding foods that bring on migraine headaches. ? Learning ways to control your body functions (biofeedback). ? Therapy to help you know and deal with negative thoughts (cognitive behavioral therapy). Follow these instructions at home: Medicines  Take over-the-counter and prescription medicines only as told by your doctor.  Ask your doctor if the medicine prescribed to you: ? Requires you to avoid driving or using heavy machinery. ? Can cause trouble pooping (constipation). You may need to take these steps to prevent or treat trouble pooping:  Drink enough fluid to keep your pee (urine) pale yellow.  Take over-the-counter or prescription medicines.  Eat foods that are high in fiber. These include beans, whole grains, and fresh fruits and vegetables.  Limit foods that are high in fat and sugar. These include fried or sweet foods. Lifestyle  Do not drink alcohol.  Do not use any products that contain nicotine or tobacco, such as cigarettes, e-cigarettes, and chewing tobacco. If you need help quitting, ask  your doctor.  Get at least 8 hours of sleep every night.  Limit and deal with stress. General instructions      Keep a journal to find out what may bring on your migraine headaches. For example, write down: ? What you eat and drink. ? How much sleep you get. ? Any change in what you eat or drink. ? Any  change in your medicines.  If you have a migraine headache: ? Avoid things that make your symptoms worse, such as bright lights. ? It may help to lie down in a dark, quiet room. ? Do not drive or use heavy machinery. ? Ask your doctor what activities are safe for you.  Keep all follow-up visits as told by your doctor. This is important. Contact a doctor if:  You get a migraine headache that is different or worse than others you have had.  You have more than 15 headache days in one month. Get help right away if:  Your migraine headache gets very bad.  Your migraine headache lasts longer than 72 hours.  You have a fever.  You have a stiff neck.  You have trouble seeing.  Your muscles feel weak or like you cannot control them.  You start to lose your balance a lot.  You start to have trouble walking.  You pass out (faint).  You have a seizure. Summary  A migraine headache is a very strong throbbing pain on one side or both sides of your head. These headaches can also cause other symptoms.  This condition may be treated with medicines and changes to your lifestyle.  Keep a journal to find out what may bring on your migraine headaches.  Contact a doctor if you get a migraine headache that is different or worse than others you have had.  Contact your doctor if you have more than 15 headache days in a month. This information is not intended to replace advice given to you by your health care provider. Make sure you discuss any questions you have with your health care provider. Document Released: 11/02/2007 Document Revised: 05/17/2018 Document Reviewed: 03/07/2018 Elsevier Patient Education  2020 [employer].

## 2019-01-16 NOTE — Progress Notes (Addendum)
PATIENT: [name] DOB: July 25, 1980  REASON FOR VISIT: follow up HISTORY FROM: patient  Chief Complaint  Patient presents with   Follow-up    room 2, alone. Eyes and head hurting. Went to Urgent care last night. loss of balance     HISTORY OF PRESENT ILLNESS: Today 01/16/19 [name] is a 38 y.o. female here today for follow up of IIH. She continues Diamox 750mg  twice daily, topiramate 200mg  and gabapentin 200mg  at bedtime daily. She reports that headaches did improve with increased dose of Diamox. About three days ago, she felt a headache starting. She has right sided pressure, worse behind right eye. She was seen yesterday by ER for acute intractable headache treated with metoclopramide and dexamethasone 10mg . She has noted some improvement today but headache continues. She did forget to tell UC provider yesterday that for a period of time, she felt as if she could only see black in the right outer vision fields. Vision is normal today with exception of mild blurriness with significant pain. She has felt nauseated as well and noticed light sensitivity driving to our appt today. She has not taken any OTC medications or Relpax as she was unsure if this would help. She is usually cautious about taking OTC medications. She uses Mirena for severe dysmenorrhea. We have discussed possible role of Mirena in worsening  IIH, however, data is conflicting. She is also followed very closely by Dr Nelda Marseille, gynecology. Patient feels benefit of controlled menstrual cycles outweighs risks of worsening IIH at this time.    HISTORY: (copied from my note on 12/03/2018)  [name] is a 38 y.o. female here today for follow up for headaches with new diagnosis of IIH. She has been seen several times in the ER for worsening headaches since being seen by me in 06/2018. At that time, she reported that headaches were doing well on topiramate and gabapentin. Relpax worked well for abortive therapy. She  started having worsening of central headaches with pressure behind her eyes about 2-3 weeks ago. She reports that her headache was persistent and unresponsive to therapy. Pressure behind right eye with peripheral vision loss. Last week, LP showed opening pressure of 30. Closing pressure of 12 and headache improved. She was started on Diamox 500mg  twice daily and referred back to [place]. She has tolerated Diamox without adverse effects over the past 10 days. She has continued topiramate 200mg  and gabapentin 100mg  at bedtime. MRI was normal. She has noticed more trouble with her memory. She feels that her words are not coming to her. She has ringing in her ears. She is nauseated at times. She has not had an eye exam. She has Mirena for birth control placed about a year ago. Twin sister was diagnosed with IIH about 10 years ago.    HISTORY: (copied from my note on 07/04/2018)  [name] a 38 y.o.femalefor follow up of migraines. She is taking topirimate 200mg and gabapentin 100mg at bedtime. She uses Relpax for abortive therapy. She does report sleepiness with Relpax.She has had about 3 migraines in the past year. She has 3-4 headaches per month. She feels that she is doing really well.   HISTORY(copied from [place] note on 10/29/2017  [name] a 38 year old female with a history of migraine headaches. She returns today for follow-up. She states overall her migraine headaches are better. She has not had a migraine in the last month. She states that she is not headaches at least 3 times a week. She  typically can take Tylenol or Excedrin tension. She reports that she does not use Relpax as much because she needs to lay downafter she takes it. She states that she works different shifts at her job which also may be affecting her sleep.   HISTORY03/14/19 [name] a 38 year old female with a history of migraine. She is currently taking 200 mg at bedtime. She uses  Relpax to treat her acute migraines. She reports that she hashad a ongoing headache for the last 3 weeks. She reports that her pain fluctuates from a 4 out of 10 to a 7 out of 10. She reports that the headache has been constant. She has used Tylenol as well as Relpax. She reports that these medications has decreased the severity but essentially did not resolve the headache. She reports theheadache usuallystartsin the center of the head and radiatesdown to the forehead. She reports with severe headachesshe will have photophobia. She reports occasionally she will have halos in her vision. She confirmed that she is also had nausea and vomiting. She has been using Zofran. In the past she has had a prednisone Dosepak for that it works well for her headache. She returns today for evaluation.   REVIEW OF SYSTEMS: Out of a complete 14 system review of symptoms, the patient complains only of the following symptoms, headaches and all other reviewed systems are negative.   ALLERGIES: Allergies  Allergen Reactions   Amoxicillin Hives, Nausea And Vomiting and Nausea Only    Has patient had a PCN reaction causing immediate rash, facial/tongue/throat swelling, SOB or lightheadedness with hypotension: Yes Has patient had a PCN reaction causing severe rash involving mucus membranes or skin necrosis: No Has patient had a PCN reaction that required hospitalization: No Has patient had a PCN reaction occurring within the last 10 years: No If all of the above answers are "NO", then may proceed with Cephalosporin use.    Aspirin Hives, Nausea And Vomiting and Nausea Only   Ibuprofen Hives, Nausea And Vomiting and Nausea Only    HOME MEDICATIONS: Outpatient Medications Prior to Visit  Medication Sig Dispense Refill   Acetaminophen-Caffeine (EXCEDRIN TENSION HEADACHE) 500-65 MG TABS Take 2 tablets by mouth every 6 (six) hours as needed (headache).     acetaZOLAMIDE (DIAMOX) 250 MG tablet Take 3  tablets (750 mg total) by mouth 2 (two) times daily. 180 tablet 11   eletriptan (RELPAX) 40 MG tablet Take 1 tab onset of migraine and repeat in 2 hours if needed. (max 2/24hr) 10 tablet 0   gabapentin (NEURONTIN) 100 MG capsule Take 2 capsules (200 mg total) by mouth at bedtime. 60 capsule 0   levonorgestrel (MIRENA) 20 MCG/24HR IUD 1 each by Intrauterine route once.     ondansetron (ZOFRAN) 4 MG tablet Take 1 tablet (4 mg total) by mouth every 8 (eight) hours as needed for nausea or vomiting. 30 tablet 3   topiramate (TOPAMAX) 200 MG tablet Take 1 tablet (200 mg total) by mouth at bedtime. 30 tablet 0   valACYclovir (VALTREX) 500 MG tablet Take 500 mg by mouth 2 (two) times daily as needed (as needed for lesions).     No facility-administered medications prior to visit.    PAST MEDICAL HISTORY: Past Medical History:  Diagnosis Date   Asthma    Common migraine with intractable migraine 05/11/2016   Hypertension    Migraines    PCOS (polycystic ovarian syndrome)    Seizures ([facility])    c/b anesthesia    PAST SURGICAL HISTORY:  Past Surgical History:  Procedure Laterality Date   CESAREAN SECTION     one previous   HERNIA REPAIR     NASAL RECONSTRUCTION      FAMILY HISTORY: Family History  Problem Relation Age of Onset   Breast cancer Maternal Aunt    Diabetes Maternal Grandmother    Diabetes Paternal Grandfather    Cancer Maternal Aunt        ovarian    SOCIAL HISTORY: Social History   Socioeconomic History   Marital status: Married    Spouse name: Not on file   Number of children: Not on file   Years of education: Not on file   Highest education level: Not on file  Occupational History   Not on file  Tobacco Use   Smoking status: Never Smoker   Smokeless tobacco: Never Used  Substance and Sexual Activity   Alcohol use: Yes    Comment: occasionally   Drug use: No   Sexual activity: Yes    Birth control/protection: Pill  Other Topics  Concern   Not on file  Social History Narrative   Not on file   Social Determinants of Health   Financial Resource Strain:    Difficulty of Paying Living Expenses: Not on file  Food Insecurity:    Worried About Running Out of Food in the Last Year: Not on file   [employer] of Food in the Last Year: Not on file  Transportation Needs:    Lack of Transportation (Medical): Not on file   Lack of Transportation (Non-Medical): Not on file  Physical Activity:    Days of Exercise per Week: Not on file   Minutes of Exercise per Session: Not on file  Stress:    Feeling of Stress : Not on file  Social Connections:    Frequency of Communication with Friends and Family: Not on file   Frequency of Social Gatherings with Friends and Family: Not on file   Attends Religious Services: Not on file   Active Member of Clubs or Organizations: Not on file   Attends Archivist Meetings: Not on file   Marital Status: Not on file  Intimate Partner Violence:    Fear of Current or Ex-Partner: Not on file   Emotionally Abused: Not on file   Physically Abused: Not on file   Sexually Abused: Not on file      PHYSICAL EXAM  Vitals:   01/16/19 0928  BP: 140/77  Pulse: 68  Temp: 98 F (36.7 C)  Weight: 142 lb 3.2 oz (64.5 kg)  Height: 5' 6.5" (1.689 m)   Body mass index is 22.61 kg/m.  Generalized: Well developed, in no acute distress  Cardiology: normal rate and rhythm, no murmur noted Respiratory: clear to auscultation bilaterally Neurological examination  Mentation: Alert oriented to time, place, history taking. Follows all commands speech and language fluent Cranial nerve II-XII: Pupils were equal round reactive to light. Extraocular movements were full, visual field were full on confrontational test. No visual field deficits noted on exam. Facial sensation and strength were normal. Uvula tongue midline. Head turning and shoulder shrug  were normal and symmetric.  Unable to visualize optic disc, no obvious vascular abnormalities.  Motor: The motor testing reveals 5 over 5 strength of all 4 extremities. Good symmetric motor tone is noted throughout. No nuchal rigidity noted or pain reported.  Sensory: Sensory testing is intact to soft touch on all 4 extremities. No evidence of extinction is noted.  Coordination: Cerebellar testing reveals good finger-nose-finger and heel-to-shin bilaterally.  Gait and station: Gait is normal.   DIAGNOSTIC DATA (LABS, IMAGING, TESTING) - I reviewed patient records, labs, notes, testing and imaging myself where available.  No flowsheet data found.   Lab Results  Component Value Date   WBC 5.5 12/01/2018   HGB 13.0 12/01/2018   HCT 40.0 12/01/2018   MCV 93.9 12/01/2018   PLT 248 12/01/2018      Component Value Date/Time   NA 139 12/01/2018 0406   K 3.6 12/01/2018 0406   CL 113 (H) 12/01/2018 0406   CO2 16 (L) 12/01/2018 0406   GLUCOSE 92 12/01/2018 0406   BUN 14 12/01/2018 0406   CREATININE 0.90 12/01/2018 0406   CALCIUM 8.8 (L) 12/01/2018 0406   PROT 6.7 11/23/2018 2004   ALBUMIN 4.2 11/23/2018 2004   AST 16 11/23/2018 2004   ALT 17 11/23/2018 2004   ALKPHOS 49 11/23/2018 2004   BILITOT 0.9 11/23/2018 2004   GFRNONAA >60 12/01/2018 0406   GFRAA >60 12/01/2018 0406   No results found for: CHOL, HDL, LDLCALC, LDLDIRECT, TRIG, CHOLHDL No results found for: HGBA1C No results found for: VITAMINB12 No results found for: TSH     ASSESSMENT AND PLAN 38 y.o. year old female  has a past medical history of Asthma, Common migraine with intractable migraine (05/11/2016), Hypertension, Migraines, PCOS (polycystic ovarian syndrome), and Seizures ([place]). here with     ICD-10-CM   1. IIH (idiopathic intracranial hypertension)  G93.2     Nasira has noted improvement in generalized headaches since increasing Diamox to 750 mg twice daily.  She is tolerating medication well with no obvious adverse effects.  We will  continue Diamox 750 mg twice daily, topiramate 200 mg at bedtime as well as gabapentin 200 mg at bedtime.  We have discussed today's findings in detail.  She was doing well until 3 days ago when she started having migrainous symptoms.  Her neuro exam shows no obvious deficits.  Visual fields are intact.  I do feel that she is suffering from a migraine today.  I will have her go home and take Relpax as prescribed, Zofran as prescribed and Benadryl 25 to 50 mg.  She was also instructed to drink as much water as she can tolerate today.  Rest and relaxation advised.  Complementary therapies including heat, ice, massage as needed.  We have discussed warnings that would require immediate medical attention including changes in speech, sudden onset of weakness or any visual changes.  She agrees to plan and will call for any worsening of headache.  We have requested last ophthalmology report in November, 2020, reportedly normal.  She will follow-up with me in 3 months, sooner if needed.  She verbalizes understanding and agreement with this plan.   No orders of the defined types were placed in this encounter.    No orders of the defined types were placed in this encounter.     I spent 30 minutes with the patient. 50% of this time was spent counseling and educating patient on plan of care and medications.    [name], FNP-C 01/16/2019, 1:17 PM [facility] [address], [place], [place] [address] [phone]  Made any corrections needed, and agree with history, physical, neuro exam,assessment and plan as stated.     Sarina Ill, MD [facility]

## 2019-01-17 DIAGNOSIS — C44702 Unspecified malignant neoplasm of skin of right lower limb, including hip: Secondary | ICD-10-CM | POA: Diagnosis not present

## 2019-01-20 ENCOUNTER — Encounter: Payer: Self-pay | Admitting: Family Medicine

## 2019-01-20 MED ORDER — GABAPENTIN 100 MG PO CAPS: 200.0000 mg | ORAL_CAPSULE | Freq: Every day | ORAL | 5 refills | Status: DC

## 2019-03-17 ENCOUNTER — Encounter: Payer: Self-pay | Admitting: Family Medicine

## 2019-03-17 ENCOUNTER — Other Ambulatory Visit: Payer: Self-pay

## 2019-03-17 ENCOUNTER — Ambulatory Visit: Payer: BC Managed Care – PPO | Admitting: Family Medicine

## 2019-03-17 VITALS — BP 127/83 | HR 65 | Temp 96.7°F | Ht 70.5 in | Wt 149.6 lb

## 2019-03-17 DIAGNOSIS — G932 Benign intracranial hypertension: Secondary | ICD-10-CM

## 2019-03-17 DIAGNOSIS — G43009 Migraine without aura, not intractable, without status migrainosus: Secondary | ICD-10-CM

## 2019-03-17 MED ORDER — ELETRIPTAN HYDROBROMIDE 40 MG PO TABS: ORAL_TABLET | ORAL | 11 refills | Status: DC

## 2019-03-17 NOTE — Patient Instructions (Addendum)
  Continue Diamox 750mg  twice daily, topiramate 200mg  and gabapentin 200mg  at bedtime.  Take eletriptan as soon as you feel headache is not responding to Tylenol.   Consider Zyrtec or Xyzal daily and benadryl as needed (at betime) for allergy treatment  Follow up in 6 months   Idiopathic Intracranial Hypertension  Idiopathic intracranial hypertension (IIH) is a condition that increases pressure around the brain. The fluid that surrounds the brain and spinal cord (cerebrospinal fluid, CSF) increases and causes the pressure. Idiopathic means that the cause of this condition is not known. IIH affects the brain and spinal cord (is a neurological disorder). If this condition is not treated, it can cause vision loss or blindness. What increases the risk? You are more likely to develop this condition if:  You are severely overweight (obese).  You are a woman who has not gone through menopause.  You take certain medicines, such as birth control or steroids. What are the signs or symptoms? Symptoms of IIH include:  Headaches. This is the most common symptom.  Pain in the shoulders or neck.  Nausea and vomiting.  A "rushing water" or pulsing sound within the ears (pulsatile tinnitus).  Double vision.  Blurred vision.  Brief episodes of complete vision loss. How is this diagnosed? This condition may be diagnosed based on:  Your symptoms.  Your medical history.  CT scan of the brain.  MRI of the brain.  Magnetic resonance venogram (MRV) to check veins in the brain.  Diagnostic lumbar puncture. This is a procedure to remove and examine a sample of cerebrospinal fluid. This procedure can determine whether too much fluid may be causing IIH.  A thorough eye exam to check for swelling or nerve damage in the eyes. How is this treated? Treatment for this condition depends on your symptoms. The goal of treatment is to decrease the pressure around your brain. Common treatments  include:  Medicines to decrease the production of spinal fluid and lower the pressure within your skull.  Medicines to prevent or treat headaches.  Surgery to place drains (shunts) in your brain to remove excess fluid.  Lumbar puncture to remove excess cerebrospinal fluid. Follow these instructions at home:  If you are overweight or obese, work with your health care provider to lose weight.  Take over-the-counter and prescription medicines only as told by your health care provider.  Do not drive or use heavy machinery while taking medicines that can make you sleepy.  Keep all follow-up visits as told by your health care provider. This is important. Contact a health care provider if:  You have changes in your vision, such as: ? Double vision. ? Not being able to see colors (color vision). Get help right away if:  You have any of the following symptoms and they get worse or do not get better. ? Headaches. ? Nausea. ? Vomiting. ? Vision changes or difficulty seeing. Summary  Idiopathic intracranial hypertension (IIH) is a condition that increases pressure around the brain. The cause is not known (is idiopathic).  The most common symptom of IIH is headaches.  Treatment may include medicines or surgery to relieve the pressure on your brain. This information is not intended to replace advice given to you by your health care provider. Make sure you discuss any questions you have with your health care provider. Document Revised: 01/05/2017 Document Reviewed: 12/15/2015 Elsevier Patient Education  2020 [employer].

## 2019-03-17 NOTE — Progress Notes (Addendum)
PATIENT: [name] DOB: [birth year]  REASON FOR VISIT: follow up HISTORY FROM: patient  Chief Complaint  Patient presents with  . Follow-up    3 mon f/u. Alone. Rm 5. Patient mentioned that she started having headaches last week. She stated that she currently has a headache.      HISTORY OF PRESENT ILLNESS: Today 03/17/19 [name] May a 39 y.o. female here today for follow up of IIH. She continues Diamox 750mg  twice daily, topiramate 200mg  and gabapentin 200mg  at bedtime. She reports that she has done well until last week. She started having a mild headache about 7 days ago. Headache was centered at the top of her head. Pain was dull aching pain. No light or sound sensitivity but she did have intermittent nausea. Vision was blurry when headache worsened but no vision loss. She reports that headache progressed for 3-4 days. She took one dose of eletriptan on Friday and two doses on Saturday. Sunday the headache was improved. Today headache May better. Blurry vision May significantly improved. She does report allergy symptoms as well. She has not taken allergy medication. She May not taking Excedrin. She May eating well and drinking plenty of water.   HISTORY: (copied from my note on 01/16/2019)  [name] May a 39 y.o. female here today for follow up of IIH. She continues Diamox 750mg  twice daily, topiramate 200mg  and gabapentin 200mg  at bedtime daily. She reports that headaches did improve with increased dose of Diamox. About three days ago, she felt a headache starting. She has right sided pressure, worse behind right eye. She was seen yesterday by ER for acute intractable headache treated with metoclopramide and dexamethasone 10mg . She has noted some improvement today but headache continues. She did forget to tell UC provider yesterday that for a period of time, she felt as if she could only see black in the right outer vision fields. Vision May normal today with exception of mild  blurriness with significant pain. She has felt nauseated as well and noticed light sensitivity driving to our appt today. She has not taken any OTC medications or Relpax as she was unsure if this would help. She May usually cautious about taking OTC medications. She uses Mirena for severe dysmenorrhea. We have discussed possible role of Mirena in worsening  IIH, however, data May conflicting. She May also followed very closely by Dr Nelda Marseille, gynecology. Patient feels benefit of controlled menstrual cycles outweighs risks of worsening IIH at this time.    HISTORY: (copied from my note on 12/03/2018)  [name] a 39 y.o.femalehere today for follow up for headaches withnew diagnosis of IIH. She has been seen several times in the ER for worsening headaches since being seen by me in 06/2018. At that time, she reportedthat headaches were doing well on topiramate and gabapentin. Relpax worked well for abortive therapy. She started having worsening of central headaches with pressure behind her eyes about 2-3 weeks ago.She reports that her headache was persistent and unresponsive to therapy. Pressure behind right eye with peripheral vision loss. Last week,LP showed opening pressure of 30.Closing pressure of 12 and headache improved. She was started on Diamox500mg  twice dailyand referred back to [place].She has tolerated Diamox without adverse effects over the past 10 days. She has continued topiramate 200mg  and gabapentin 100mg  at bedtime. MRI was normal. She has noticed more trouble with her memory. She feels that her words are not coming to her. She has ringing in her ears. She May nauseated  at times. She has not had an eye exam. She has Mirena for birth control placed about a year ago. Twin sister was diagnosed with IIH about 10 years ago.   HISTORY: (copied frommynote on 07/04/2018)  [name] a 39 y.o.femalefor follow up of migraines. She May taking topirimate 200mg and gabapentin  100mg at bedtime. She uses Relpax for abortive therapy. She does report sleepiness with Relpax.She has had about 3 migraines in the past year. She has 3-4 headaches per month. She feels that she May doing really well.   HISTORY(copied from [place] note on 10/29/2017  [name] a 39 year old female with a history of migraine headaches. She returns today for follow-up. She states overall her migraine headaches are better. She has not had a migraine in the last month. She states that she May not headaches at least 3 times a week. She typically can take Tylenol or Excedrin tension. She reports that she does not use Relpax as much because she needs to lay downafter she takes it. She states that she works different shifts at her job which also may be affecting her sleep.   HISTORY03/14/19 [name] a 39 year old female with a history of migraine. She May currently taking 200 mg at bedtime. She uses Relpax to treat her acute migraines. She reports that she hashad a ongoing headache for the last 3 weeks. She reports that her pain fluctuates from a 4 out of 10 to a 7 out of 10. She reports that the headache has been constant. She has used Tylenol as well as Relpax. She reports that these medications has decreased the severity but essentially did not resolve the headache. She reports theheadache usuallystartsin the center of the head and radiatesdown to the forehead. She reports with severe headachesshe will have photophobia. She reports occasionally she will have halos in her vision. She confirmed that she May also had nausea and vomiting. She has been using Zofran. In the past she has had a prednisone Dosepak for that it works well for her headache. She returns today for evaluation.   REVIEW OF SYSTEMS: Out of a complete 14 system review of symptoms, the patient complains only of the following symptoms, headaches and all other reviewed systems are negative.    ALLERGIES: Allergies  Allergen Reactions  . Amoxicillin Hives, Nausea And Vomiting and Nausea Only    Has patient had a PCN reaction causing immediate rash, facial/tongue/throat swelling, SOB or lightheadedness with hypotension: Yes Has patient had a PCN reaction causing severe rash involving mucus membranes or skin necrosis: No Has patient had a PCN reaction that required hospitalization: No Has patient had a PCN reaction occurring within the last 10 years: No If all of the above answers are "NO", then may proceed with Cephalosporin use.   . Aspirin Hives, Nausea And Vomiting and Nausea Only  . Ibuprofen Hives, Nausea And Vomiting and Nausea Only    HOME MEDICATIONS: Outpatient Medications Prior to Visit  Medication Sig Dispense Refill  . acetaZOLAMIDE (DIAMOX) 250 MG tablet Take 3 tablets (750 mg total) by mouth 2 (two) times daily. 180 tablet 11  . gabapentin (NEURONTIN) 100 MG capsule Take 2 capsules (200 mg total) by mouth at bedtime. 60 capsule 5  . levonorgestrel (MIRENA) 20 MCG/24HR IUD 1 each by Intrauterine route once.    . ondansetron (ZOFRAN) 4 MG tablet Take 1 tablet (4 mg total) by mouth every 8 (eight) hours as needed for nausea or vomiting. 30 tablet 3  . topiramate (TOPAMAX) 200 MG  tablet Take 1 tablet (200 mg total) by mouth at bedtime. 30 tablet 0  . valACYclovir (VALTREX) 500 MG tablet Take 500 mg by mouth 2 (two) times daily as needed (as needed for lesions).    . eletriptan (RELPAX) 40 MG tablet Take 1 tab onset of migraine and repeat in 2 hours if needed. (max 2/24hr) 10 tablet 0  . Acetaminophen-Caffeine (EXCEDRIN TENSION HEADACHE) 500-65 MG TABS Take 2 tablets by mouth as needed (headache).      No facility-administered medications prior to visit.    PAST MEDICAL HISTORY: Past Medical History:  Diagnosis Date  . Asthma   . Common migraine with intractable migraine 05/11/2016  . Hypertension   . Migraines   . PCOS (polycystic ovarian syndrome)   . Seizures  ([place])    c/b anesthesia    PAST SURGICAL HISTORY: Past Surgical History:  Procedure Laterality Date  . CESAREAN SECTION     one previous  . HERNIA REPAIR    . NASAL RECONSTRUCTION      FAMILY HISTORY: Family History  Problem Relation Age of Onset  . Breast cancer Maternal Aunt   . Diabetes Maternal Grandmother   . Diabetes Paternal Grandfather   . Cancer Maternal Aunt        ovarian    SOCIAL HISTORY: Social History   Socioeconomic History  . Marital status: Married    Spouse name: Not on file  . Number of children: Not on file  . Years of education: Not on file  . Highest education level: Not on file  Occupational History  . Not on file  Tobacco Use  . Smoking status: Never Smoker  . Smokeless tobacco: Never Used  Substance and Sexual Activity  . Alcohol use: Yes    Comment: occasionally  . Drug use: No  . Sexual activity: Yes    Birth control/protection: Pill  Other Topics Concern  . Not on file  Social History Narrative  . Not on file   Social Determinants of Health   Financial Resource Strain:   . Difficulty of Paying Living Expenses: Not on file  Food Insecurity:   . Worried About Charity fundraiser in the Last Year: Not on file  . Ran Out of Food in the Last Year: Not on file  Transportation Needs:   . Lack of Transportation (Medical): Not on file  . Lack of Transportation (Non-Medical): Not on file  Physical Activity:   . Days of Exercise per Week: Not on file  . Minutes of Exercise per Session: Not on file  Stress:   . Feeling of Stress : Not on file  Social Connections:   . Frequency of Communication with Friends and Family: Not on file  . Frequency of Social Gatherings with Friends and Family: Not on file  . Attends Religious Services: Not on file  . Active Member of Clubs or Organizations: Not on file  . Attends Archivist Meetings: Not on file  . Marital Status: Not on file  Intimate Partner Violence:   . Fear of Current or  Ex-Partner: Not on file  . Emotionally Abused: Not on file  . Physically Abused: Not on file  . Sexually Abused: Not on file      PHYSICAL EXAM  Vitals:   03/17/19 0733  BP: 127/83  Pulse: 65  Temp: (!) 96.7 F (35.9 C)  TempSrc: Oral  Weight: 149 lb 9.6 oz (67.9 kg)  Height: 5' 10.5" (1.791 m)   Body  mass index May 21.16 kg/m.  Generalized: Well developed, in no acute distress  Cardiology: normal rate and rhythm, no murmur noted Neurological examination  Mentation: Alert oriented to time, place, history taking. Follows all commands speech and language fluent Cranial nerve II-XII: Pupils were equal round reactive to light. Extraocular movements were full, visual field were full on confrontational test. Facial sensation and strength were normal. Uvula tongue midline. Head turning and shoulder shrug  were normal and symmetric. Motor: The motor testing reveals 5 over 5 strength of all 4 extremities. Good symmetric motor tone May noted throughout.  Sensory: Sensory testing May intact to soft touch on all 4 extremities. No evidence of extinction May noted.  Coordination: Cerebellar testing reveals good finger-nose-finger and heel-to-shin bilaterally.  Gait and station: Gait May normal.   DIAGNOSTIC DATA (LABS, IMAGING, TESTING) - I reviewed patient records, labs, notes, testing and imaging myself where available.  No flowsheet data found.   Lab Results  Component Value Date   WBC 5.5 12/01/2018   HGB 13.0 12/01/2018   HCT 40.0 12/01/2018   MCV 93.9 12/01/2018   PLT 248 12/01/2018      Component Value Date/Time   NA 139 12/01/2018 0406   K 3.6 12/01/2018 0406   CL 113 (H) 12/01/2018 0406   CO2 16 (L) 12/01/2018 0406   GLUCOSE 92 12/01/2018 0406   BUN 14 12/01/2018 0406   CREATININE 0.90 12/01/2018 0406   CALCIUM 8.8 (L) 12/01/2018 0406   PROT 6.7 11/23/2018 2004   ALBUMIN 4.2 11/23/2018 2004   AST 16 11/23/2018 2004   ALT 17 11/23/2018 2004   ALKPHOS 49 11/23/2018  2004   BILITOT 0.9 11/23/2018 2004   GFRNONAA >60 12/01/2018 0406   GFRAA >60 12/01/2018 0406   No results found for: CHOL, HDL, LDLCALC, LDLDIRECT, TRIG, CHOLHDL No results found for: HGBA1C No results found for: VITAMINB12 No results found for: TSH     ASSESSMENT AND PLAN 39 y.o. year old female  has a past medical history of Asthma, Common migraine with intractable migraine (05/11/2016), Hypertension, Migraines, PCOS (polycystic ovarian syndrome), and Seizures ([place]). here with     ICD-10-CM   1. IIH (idiopathic intracranial hypertension)  G93.2   2. Migraine without aura and without status migrainosus, not intractable  G43.009     [name] May doing well overall. For 6-7 weeks, she did not have any difficulty with headaches. She did experience a migrainous headache last week. She reports that she waited about 3-4 days before taking eletriptan but once she took medication, headache improved. We have discussed mechanism of action and timing of effectiveness with eletriptan. I have advised that she take medication as soon as she recognizes migraine symptoms. She will also try Zyrtec or Xyzal for concerns of allergy symptoms. She will continue Diamox, topiamate and gabapentin as prescribed. She will update eye exam as advised and return to see me in 6 months. She verbalizes understanding and agreement with this plan.    No orders of the defined types were placed in this encounter.    Meds ordered this encounter  Medications  . eletriptan (RELPAX) 40 MG tablet    Sig: Take 1 tab onset of migraine and repeat in 2 hours if needed. (max 2/24hr)    Dispense:  10 tablet    Refill:  11    Order Specific Question:   Supervising Provider    Answer:   Melvenia Beam [id number]      Denise Kerin,  FNP-C 03/17/2019, 7:59 AM [facility] [address], [place], [place] [address] [phone]  Made any corrections needed, and agree with history, physical, neuro  exam,assessment and plan as stated.     Sarina Ill, MD [facility]

## 2019-04-03 DIAGNOSIS — R0600 Dyspnea, unspecified: Secondary | ICD-10-CM | POA: Diagnosis not present

## 2019-04-03 DIAGNOSIS — Z0000 Encounter for general adult medical examination without abnormal findings: Secondary | ICD-10-CM | POA: Diagnosis not present

## 2019-04-14 ENCOUNTER — Emergency Department (HOSPITAL_COMMUNITY): Payer: BC Managed Care – PPO

## 2019-04-14 ENCOUNTER — Emergency Department (HOSPITAL_COMMUNITY)
Admission: EM | Admit: 2019-04-14 | Discharge: 2019-04-14 | Disposition: A | Discharge status: Home / Self Care | Payer: BC Managed Care – PPO | Attending: Emergency Medicine | Admitting: Emergency Medicine

## 2019-04-14 ENCOUNTER — Encounter (HOSPITAL_COMMUNITY): Payer: Self-pay

## 2019-04-14 ENCOUNTER — Other Ambulatory Visit: Payer: Self-pay

## 2019-04-14 DIAGNOSIS — R0789 Other chest pain: Secondary | ICD-10-CM | POA: Diagnosis not present

## 2019-04-14 DIAGNOSIS — Z79899 Other long term (current) drug therapy: Secondary | ICD-10-CM | POA: Diagnosis not present

## 2019-04-14 DIAGNOSIS — Z85831 Personal history of malignant neoplasm of soft tissue: Secondary | ICD-10-CM | POA: Insufficient documentation

## 2019-04-14 DIAGNOSIS — J45909 Unspecified asthma, uncomplicated: Secondary | ICD-10-CM | POA: Diagnosis not present

## 2019-04-14 DIAGNOSIS — R079 Chest pain, unspecified: Secondary | ICD-10-CM

## 2019-04-14 DIAGNOSIS — R5383 Other fatigue: Secondary | ICD-10-CM | POA: Insufficient documentation

## 2019-04-14 DIAGNOSIS — Z20822 Contact with and (suspected) exposure to covid-19: Secondary | ICD-10-CM | POA: Insufficient documentation

## 2019-04-14 DIAGNOSIS — I10 Essential (primary) hypertension: Secondary | ICD-10-CM | POA: Insufficient documentation

## 2019-04-14 DIAGNOSIS — R0602 Shortness of breath: Secondary | ICD-10-CM | POA: Diagnosis not present

## 2019-04-14 HISTORY — DX: Malignant (primary) neoplasm, unspecified: C80.1

## 2019-04-14 LAB — TROPONIN I (HIGH SENSITIVITY)
Troponin I (High Sensitivity): <2 ng/L (ref <18)
Troponin I (High Sensitivity): <2 ng/L (ref <18)

## 2019-04-14 LAB — BASIC METABOLIC PANEL
Anion gap: 10 (ref 5–15)
BUN: 11 mg/dL (ref 6–20)
CO2: 19 mmol/L — ABNORMAL LOW (ref 22–32)
Calcium: 9.3 mg/dL (ref 8.9–10.3)
Chloride: 112 mmol/L — ABNORMAL HIGH (ref 98–111)
Creatinine, Ser: 1 mg/dL (ref 0.44–1.00)
GFR calc Af Amer: >60 mL/min (ref >60)
GFR calc non Af Amer: >60 mL/min (ref >60)
Glucose, Bld: 96 mg/dL (ref 70–99)
Potassium: 3.3 mmol/L — ABNORMAL LOW (ref 3.5–5.1)
Sodium: 141 mmol/L (ref 135–145)

## 2019-04-14 LAB — BRAIN NATRIURETIC PEPTIDE: B Natriuretic Peptide: 37.5 pg/mL (ref 0.0–100.0)

## 2019-04-14 LAB — I-STAT BETA HCG BLOOD, ED (MC, WL, AP ONLY): I-stat hCG, quantitative: <5 m[IU]/mL (ref <5)

## 2019-04-14 LAB — CBC
HCT: 43.1 % (ref 36.0–46.0)
Hemoglobin: 13.8 g/dL (ref 12.0–15.0)
MCH: 30.5 pg (ref 26.0–34.0)
MCHC: 32 g/dL (ref 30.0–36.0)
MCV: 95.1 fL (ref 80.0–100.0)
Platelets: 277 10*3/uL (ref 150–400)
RBC: 4.53 MIL/uL (ref 3.87–5.11)
RDW: 13.2 % (ref 11.5–15.5)
WBC: 5.9 10*3/uL (ref 4.0–10.5)
nRBC: 0 % (ref 0.0–0.2)

## 2019-04-14 LAB — D-DIMER, QUANTITATIVE: D-Dimer, Quant: <0.27 ug/mL-FEU (ref 0.00–0.50)

## 2019-04-14 MED ORDER — POTASSIUM CHLORIDE CRYS ER 20 MEQ PO TBCR
40.0000 meq | EXTENDED_RELEASE_TABLET | Freq: Once | ORAL | Status: AC
  Administered 2019-04-14: 40 meq via ORAL
  Filled 2019-04-14: qty 2

## 2019-04-14 MED ORDER — ALBUTEROL SULFATE HFA 108 (90 BASE) MCG/ACT IN AERS
2.0000 | INHALATION_SPRAY | Freq: Once | RESPIRATORY_TRACT | Status: AC
  Administered 2019-04-14: 2 via RESPIRATORY_TRACT
  Filled 2019-04-14: qty 6.7

## 2019-04-14 MED ORDER — ACETAMINOPHEN 325 MG PO TABS
650.0000 mg | ORAL_TABLET | Freq: Once | ORAL | Status: AC
  Administered 2019-04-14: 650 mg via ORAL
  Filled 2019-04-14: qty 2

## 2019-04-14 NOTE — ED Provider Notes (Signed)
Received patient at signout from Sarah Ann.  Refer to provider note for full history and physical examination.  Briefly, patient is a 39 year old female with history of asthma, migraines, skin cancer presents for evaluation of 6-week history of intermittent shortness of breath and substernal chest pains.  Work-up thus far has been reassuring, D-dimer and BNP are negative.  She was ambulated in the ED with stable SPO2 saturations.  Cardiology was consulted and recommends outpatient follow-up if second troponin is stable.  Pending second troponin.    MDM   Labs Reviewed  BASIC METABOLIC PANEL - Abnormal; Notable for the following components:      Result Value   Potassium 3.3 (*)    Chloride 112 (*)    CO2 19 (*)    All other components within normal limits  SARS CORONAVIRUS 2 (TAT 6-24 HRS)  CBC  D-DIMER, QUANTITATIVE (NOT AT [facility])  BRAIN NATRIURETIC PEPTIDE  I-STAT BETA HCG BLOOD, ED ([facility], [facility], [facility] ONLY)  TROPONIN I (HIGH SENSITIVITY)  TROPONIN I (HIGH SENSITIVITY)   Serial troponins are stable.  Patient stable for discharge home at this time with close cardiology follow-up.       Renita Papa, PA-C 04/14/19 2026    Margette Fast, MD 04/15/19 1118

## 2019-04-14 NOTE — ED Notes (Signed)
Pt ambulated around room without assistance. Pt's saturation remained at 100%. Pt reported no SHOB, lightheadedness, or dizziness.

## 2019-04-14 NOTE — Discharge Instructions (Addendum)
Your work-up today did not show any sign of heart attack or blood clot in the lungs.  You were given a referral to the cardiology office.  They should be reaching out to you to schedule an appointment for follow-up.  If you not hear from the cardiology office in the next few days you should call the office to schedule an appointment for follow-up.  You also need to follow-up with your regular doctor to have your labs rechecked to recheck your potassium.  If you have any new or worsening symptoms in the meantime you will need to return the emergency department immediately.

## 2019-04-14 NOTE — ED Provider Notes (Signed)
[facility] EMERGENCY DEPARTMENT Provider Note   CSN: [id number] Arrival date & time: 04/14/19  1512     History Chief Complaint  Patient presents with  . Chest Pain  . Shortness of Breath    [name] is a 39 y.o. female.  HPI   Pt is a 39 y/o with a h/o asthma, CA, migraines, PCOS, seizures, who presents to the ED today for eval of chest discomfort described as a pressure. Pain located to the middle of the chest and is rated 4/10. Pain started intermittently for the last 5 weeks.  She also reports dyspnea on exertion for the same time period. She notes that she has felt very fatigued. sxs are unrelieved by albuterol. Denies gi/gu sxs. No cough or pleuritic pain.  Reports recent exposure to [place] positive coworker.  Denies leg pain, cough, hemoptysis, recent surgery/trauma, recent long travel, estrogen use,  or hx of DVT/PE. She has a h/o dermatofibromasarcoma protuberans. She does not smoke. She states her maternal grandfather had MI in his late 97s, otherwise she denies early family hx of heart disease.  Reviewed records per care everywhere. Pt had CT chest 01/26/2019 showed no definite evidence of metastatic disease in the chest. Sub-6 mm pulmonary nodule, technically indeterminant.  States she was just seen by her PCP for her symptoms and had a ddimer, tsh, cbc, and cmp on 04/01/19. I reviewed these results with her on her mychart and they were all nonconcerning.   Past Medical History:  Diagnosis Date  . Asthma   . Cancer ([facility])    cancer in my leg  . Common migraine with intractable migraine 05/11/2016  . Hypertension   . Migraines   . PCOS (polycystic ovarian syndrome)   . Seizures ([place])    c/b anesthesia    Patient Active Problem List   Diagnosis Date Noted  . Common migraine with intractable migraine 05/11/2016  . Axillary lump 06/25/2012    Past Surgical History:  Procedure Laterality Date  . CESAREAN SECTION     one previous  . HERNIA  REPAIR    . NASAL RECONSTRUCTION       OB History    Gravida  1   Para  1   Term  1   Preterm      AB      Living  1     SAB      TAB      Ectopic      Multiple      Live Births              Family History  Problem Relation Age of Onset  . Breast cancer Maternal Aunt   . Diabetes Maternal Grandmother   . Diabetes Paternal Grandfather   . Cancer Maternal Aunt        ovarian    Social History   Tobacco Use  . Smoking status: Never Smoker  . Smokeless tobacco: Never Used  Substance Use Topics  . Alcohol use: Yes    Comment: occasionally  . Drug use: No    Home Medications Prior to Admission medications   Medication Sig Start Date End Date Taking? Authorizing Provider  acetaminophen (TYLENOL) 650 MG CR tablet Take 650-1,300 mg by mouth 2 (two) times daily as needed for pain.   Yes [provider]  acetaZOLAMIDE (DIAMOX) 250 MG tablet Take 3 tablets (750 mg total) by mouth 2 (two) times daily. 12/03/18  Yes Lomax, Amy, NP  cetirizine (  ZYRTEC) 10 MG tablet Take 10 mg by mouth daily as needed for allergies or rhinitis.   Yes [provider]  diphenhydrAMINE (BENADRYL CHILDRENS ALLERGY) 12.5 MG/5ML liquid Take 25 mg by mouth at bedtime as needed for sleep.   Yes [provider]  eletriptan (RELPAX) 40 MG tablet Take 1 tab onset of migraine and repeat in 2 hours if needed. (max 2/24hr) Patient taking differently: Take 40 mg by mouth See admin instructions. Take 40 mg by mouth at onset of migraine- may repeat once in two hours, if no relief (Max 2 tablets/24 hours) 03/17/19  Yes Lomax, Amy, NP  gabapentin (NEURONTIN) 100 MG capsule Take 2 capsules (200 mg total) by mouth at bedtime. Patient taking differently: Take 200 mg by mouth daily.  01/20/19  Yes Lomax, Amy, NP  levonorgestrel (MIRENA) 20 MCG/24HR IUD 1 each by Intrauterine route once.   Yes [provider]  loratadine (CLARITIN) 10 MG tablet Take 10 mg by mouth daily as  needed for allergies or rhinitis.   Yes [provider]  ondansetron (ZOFRAN) 4 MG tablet Take 1 tablet (4 mg total) by mouth every 8 (eight) hours as needed for nausea or vomiting. 12/03/18  Yes Lomax, Amy, NP  topiramate (TOPAMAX) 200 MG tablet Take 1 tablet (200 mg total) by mouth at bedtime. 12/01/18  Yes Ward, Delice Bison, DO  valACYclovir (VALTREX) 500 MG tablet Take 500 mg by mouth 2 (two) times daily as needed (as needed for lesions).   Yes [provider]    Allergies    Amoxicillin, Aspirin, Ibuprofen, and Adhesive [tape]  Review of Systems   Review of Systems  Constitutional: Positive for fatigue. Negative for chills and fever.  HENT: Negative for ear pain and sore throat.   Eyes: Negative for pain and visual disturbance.  Respiratory: Positive for shortness of breath. Negative for cough.   Cardiovascular: Positive for chest pain. Negative for palpitations and leg swelling.  Gastrointestinal: Negative for abdominal pain, constipation, diarrhea, nausea and vomiting.  Genitourinary: Negative for dysuria and hematuria.  Musculoskeletal: Negative for back pain.  Skin: Negative for rash.  Neurological: Negative for headaches.  All other systems reviewed and are negative.   Physical Exam Updated Vital Signs BP 135/86 (BP Location: Right Arm)   Pulse 66   Temp 97.8 F (36.6 C) (Oral)   Resp 20   Ht 5' 6.5" (1.689 m)   Wt 67.1 kg   LMP 04/12/2019   SpO2 100%   BMI 23.53 kg/m   Physical Exam Vitals and nursing note reviewed.  Constitutional:      General: She is not in acute distress.    Appearance: She is well-developed.  HENT:     Head: Normocephalic and atraumatic.  Eyes:     Conjunctiva/sclera: Conjunctivae normal.  Cardiovascular:     Rate and Rhythm: Normal rate and regular rhythm.     Heart sounds: No murmur.  Pulmonary:     Effort: Pulmonary effort is normal. No respiratory distress.     Breath sounds: Normal breath sounds. No decreased  breath sounds, wheezing, rhonchi or rales.  Abdominal:     Palpations: Abdomen is soft.     Tenderness: There is no abdominal tenderness.  Musculoskeletal:     Cervical back: Neck supple.     Right lower leg: No tenderness. No edema.     Left lower leg: No tenderness. No edema.  Skin:    General: Skin is warm and dry.  Neurological:  Mental Status: She is alert.     ED Results / Procedures / Treatments   Labs (all labs ordered are listed, but only abnormal results are displayed) Labs Reviewed  BASIC METABOLIC PANEL - Abnormal; Notable for the following components:      Result Value   Potassium 3.3 (*)    Chloride 112 (*)    CO2 19 (*)    All other components within normal limits  SARS CORONAVIRUS 2 (TAT 6-24 HRS)  CBC  D-DIMER, QUANTITATIVE (NOT AT [facility])  BRAIN NATRIURETIC PEPTIDE  I-STAT BETA HCG BLOOD, ED ([facility], [facility], [facility] ONLY)  TROPONIN I (HIGH SENSITIVITY)  TROPONIN I (HIGH SENSITIVITY)    EKG EKG Interpretation  Date/Time:  Monday April 14 2019 16:48:18 EST Ventricular Rate:  80 PR Interval:  132 QRS Duration: 101 QT Interval:  382 QTC Calculation: 441 R Axis:   76 Text Interpretation: Sinus rhythm Consider left atrial enlargement Nonspecific T abnormalities, diffuse leads no significant change from most recenty, however I think some of the t waves are artifact Confirmed by Merrily Pew [phone]) on 04/14/2019 6:27:48 PM   Radiology DG Chest 2 View  Result Date: 04/14/2019 CLINICAL DATA:  Chest pain EXAM: CHEST - 2 VIEW COMPARISON:  Jun 13, 2016 FINDINGS: Lungs are clear. Heart size and pulmonary vascularity are normal. No adenopathy. There is upper thoracic levoscoliosis with mid to lower thoracic dextroscoliosis. No pneumothorax. IMPRESSION: Lungs clear.  Heart size normal.  There is scoliosis. Electronically Signed   By: Lowella Grip III M.D.   On: 04/14/2019 16:12    Procedures Procedures (including critical care time)  Medications Ordered in  ED Medications  albuterol (VENTOLIN HFA) 108 (90 Base) MCG/ACT inhaler 2 puff (2 puffs Inhalation Given 04/14/19 1834)  acetaminophen (TYLENOL) tablet 650 mg (650 mg Oral Given 04/14/19 1834)  potassium chloride SA (KLOR-CON) CR tablet 40 mEq (40 mEq Oral Given 04/14/19 1834)    ED Course  I have reviewed the triage vital signs and the nursing notes.  Pertinent labs & imaging results that were available during my care of the patient were reviewed by me and considered in my medical decision making (see chart for details).    MDM Rules/Calculators/A&P                      Pt with CP and exertional dyspnea x6 weeks.   Reviewed/interpreted labs CBC w/o leukocytosis or anemia BMP with mild hypokalemia and low bicarb similar to previous  - PO K given Trop neg Ddimer neg BNP negative making heart failure less likely  EKG with NSR, with sinus arrhythmia, Nonspecific ST and T wave abnormality Abnormal ECG similar to previous with new TWI in II and V3   Repeat EKG with Sinus rhythm Consider left atrial enlargement Nonspecific T abnormalities, diffuse leads no significant change from most recenty, however I think some of the t waves are artifact C  CXR reviewed/interpreted - no pna, ptx, or other acute abnormality  Patient was ambulated in the room with pulse ox monitoring and maintain oxygen saturation at 100% on room air.  She did not report any shortness of breath lightheadedness or dizziness.  Discussed case with Dr. Dayna Barker who reviewed EKGs. He recommended cards consult but likely d/c.  6:48 PM CONSULT with Dr. Marlou Porch with cardiology who reviewed the EKGs and does recommend f/u with cards. He will send a message to Trish to reach out to the patient.  Reassessed patient.  Discussed work-up thus  far and plan for discharge if delta troponin is negative.  Discussed plan to follow-up with cardiology.  At shift change, care transitioned to [facility], PA-C with plan to follow-up on pending  troponin.  Final Clinical Impression(s) / ED Diagnoses Final diagnoses:  Chest pain, unspecified type    Rx / DC Orders ED Discharge Orders    None       Bishop Dublin 04/14/19 1902    Mesner, Corene Cornea, MD 04/18/19 0030

## 2019-04-14 NOTE — ED Notes (Signed)
Discharge instructions reviewed with pt. Pt verbalized understanding.   

## 2019-04-14 NOTE — ED Triage Notes (Signed)
Pt reports intermittent chest pain for the past few weeks along with SOB. Pt a.o, resp e.u at this time.

## 2019-04-15 LAB — SARS CORONAVIRUS 2 (TAT 6-24 HRS): SARS Coronavirus 2: NEGATIVE

## 2019-04-17 ENCOUNTER — Ambulatory Visit: Payer: BC Managed Care – PPO | Admitting: Family Medicine

## 2019-04-17 DIAGNOSIS — L237 Allergic contact dermatitis due to plants, except food: Secondary | ICD-10-CM | POA: Diagnosis not present

## 2019-04-17 DIAGNOSIS — L905 Scar conditions and fibrosis of skin: Secondary | ICD-10-CM | POA: Diagnosis not present

## 2019-04-17 DIAGNOSIS — D235 Other benign neoplasm of skin of trunk: Secondary | ICD-10-CM | POA: Diagnosis not present

## 2019-04-22 ENCOUNTER — Emergency Department (HOSPITAL_COMMUNITY): Payer: BC Managed Care – PPO

## 2019-04-22 ENCOUNTER — Encounter (HOSPITAL_COMMUNITY): Payer: Self-pay | Admitting: Emergency Medicine

## 2019-04-22 ENCOUNTER — Emergency Department (HOSPITAL_COMMUNITY)
Admission: EM | Admit: 2019-04-22 | Discharge: 2019-04-22 | Disposition: A | Discharge status: Home / Self Care | Payer: BC Managed Care – PPO | Attending: Emergency Medicine | Admitting: Emergency Medicine

## 2019-04-22 ENCOUNTER — Other Ambulatory Visit: Payer: Self-pay

## 2019-04-22 DIAGNOSIS — R079 Chest pain, unspecified: Secondary | ICD-10-CM | POA: Diagnosis not present

## 2019-04-22 DIAGNOSIS — I10 Essential (primary) hypertension: Secondary | ICD-10-CM | POA: Diagnosis not present

## 2019-04-22 DIAGNOSIS — R0789 Other chest pain: Secondary | ICD-10-CM

## 2019-04-22 DIAGNOSIS — J45909 Unspecified asthma, uncomplicated: Secondary | ICD-10-CM | POA: Insufficient documentation

## 2019-04-22 DIAGNOSIS — Z859 Personal history of malignant neoplasm, unspecified: Secondary | ICD-10-CM | POA: Insufficient documentation

## 2019-04-22 DIAGNOSIS — Z79899 Other long term (current) drug therapy: Secondary | ICD-10-CM | POA: Insufficient documentation

## 2019-04-22 DIAGNOSIS — R0600 Dyspnea, unspecified: Secondary | ICD-10-CM | POA: Insufficient documentation

## 2019-04-22 LAB — BASIC METABOLIC PANEL
Anion gap: 9 (ref 5–15)
BUN: 20 mg/dL (ref 6–20)
CO2: 19 mmol/L — ABNORMAL LOW (ref 22–32)
Calcium: 9.3 mg/dL (ref 8.9–10.3)
Chloride: 112 mmol/L — ABNORMAL HIGH (ref 98–111)
Creatinine, Ser: 1.16 mg/dL — ABNORMAL HIGH (ref 0.44–1.00)
GFR calc Af Amer: >60 mL/min (ref >60)
GFR calc non Af Amer: >60 mL/min (ref >60)
Glucose, Bld: 86 mg/dL (ref 70–99)
Potassium: 3.7 mmol/L (ref 3.5–5.1)
Sodium: 140 mmol/L (ref 135–145)

## 2019-04-22 LAB — CBC
HCT: 44.1 % (ref 36.0–46.0)
Hemoglobin: 13.8 g/dL (ref 12.0–15.0)
MCH: 30.1 pg (ref 26.0–34.0)
MCHC: 31.3 g/dL (ref 30.0–36.0)
MCV: 96.3 fL (ref 80.0–100.0)
Platelets: 257 10*3/uL (ref 150–400)
RBC: 4.58 MIL/uL (ref 3.87–5.11)
RDW: 13.5 % (ref 11.5–15.5)
WBC: 7.7 10*3/uL (ref 4.0–10.5)
nRBC: 0 % (ref 0.0–0.2)

## 2019-04-22 LAB — TROPONIN I (HIGH SENSITIVITY)
Troponin I (High Sensitivity): <2 ng/L (ref <18)
Troponin I (High Sensitivity): <2 ng/L (ref <18)

## 2019-04-22 MED ORDER — PREDNISONE 20 MG PO TABS: 40.0000 mg | ORAL_TABLET | Freq: Every day | ORAL | 0 refills | Status: DC

## 2019-04-22 MED ORDER — PREDNISONE 20 MG PO TABS
60.0000 mg | ORAL_TABLET | Freq: Once | ORAL | Status: AC
  Administered 2019-04-22: 16:00:00 60 mg via ORAL
  Filled 2019-04-22: qty 3

## 2019-04-22 MED ORDER — SODIUM CHLORIDE 0.9% FLUSH: 3.0000 mL | Freq: Once | INTRAVENOUS | Status: DC

## 2019-04-22 NOTE — Discharge Instructions (Addendum)
As discussed, your evaluation today has been largely reassuring.  But, it is important that you monitor your condition carefully, and do not hesitate to return to the ED if you develop new, or concerning changes in your condition.  You are starting an increased dosage regimen of your anti-inflammatory medication.  It is important to discuss this with your physician on follow-up.

## 2019-04-22 NOTE — ED Triage Notes (Signed)
Pt arrives to ED from home with complaints of intermittent chest pain and shortness of breath for months that has worsened over the last two days. Patient was here for same last week with reassuring workup. Patient has no followed up with cardiology.

## 2019-04-22 NOTE — ED Provider Notes (Signed)
[facility] EMERGENCY DEPARTMENT Provider Note   CSN: [id number] Arrival date & time: 04/22/19  1156     History Chief Complaint  Patient presents with  . Chest Pain  . Shortness of Breath    [name] is a 39 y.o. female.  HPI    Patient presents concern of chest pain, dyspnea. She notes that she has had symptoms for about 3 weeks.  During this period she has actually been seen and evaluated here, has discussed that reassuring evaluation with her oncologist, and is scheduled for follow-up next month.  She notes that with ongoing discomfort that is essentially the same as it has been for the past 3 weeks she spoke with the office today, and was referred here due to ongoing discomfort.  She describes it as a squeezing sensation throughout her chest, with associated dyspnea.  No obvious cough, no fever. She has no history of cardiac disease, does have notable history of recent excision of skin tumor.  She notes that during that evaluation she was found to have possible lesion in her lungs as well.  She is scheduled for follow-up CT scan in the coming months. It is unclear she is taking any medication for her chest pain, dyspnea recently.   Past Medical History:  Diagnosis Date  . Asthma   . Cancer ([place])    cancer in my leg  . Common migraine with intractable migraine 05/11/2016  . Hypertension   . Migraines   . PCOS (polycystic ovarian syndrome)   . Seizures ([place])    c/b anesthesia    Patient Active Problem List   Diagnosis Date Noted  . Common migraine with intractable migraine 05/11/2016  . Axillary lump 06/25/2012    Past Surgical History:  Procedure Laterality Date  . CESAREAN SECTION     one previous  . HERNIA REPAIR    . NASAL RECONSTRUCTION       OB History    Gravida  1   Para  1   Term  1   Preterm      AB      Living  1     SAB      TAB      Ectopic      Multiple      Live Births              Family History   Problem Relation Age of Onset  . Breast cancer Maternal Aunt   . Diabetes Maternal Grandmother   . Diabetes Paternal Grandfather   . Cancer Maternal Aunt        ovarian    Social History   Tobacco Use  . Smoking status: Never Smoker  . Smokeless tobacco: Never Used  Substance Use Topics  . Alcohol use: Yes    Comment: occasionally  . Drug use: No    Home Medications Prior to Admission medications   Medication Sig Start Date End Date Taking? Authorizing Provider  acetaminophen (TYLENOL) 650 MG CR tablet Take 650-1,300 mg by mouth 2 (two) times daily as needed for pain.   Yes [provider]  acetaZOLAMIDE (DIAMOX) 250 MG tablet Take 3 tablets (750 mg total) by mouth 2 (two) times daily. 12/03/18  Yes Lomax, Amy, NP  cetirizine (ZYRTEC) 10 MG tablet Take 10 mg by mouth daily as needed for allergies or rhinitis.   Yes [provider]  diphenhydrAMINE (BENADRYL CHILDRENS ALLERGY) 12.5 MG/5ML liquid Take 25 mg by mouth at bedtime  as needed for sleep.   Yes [provider]  eletriptan (RELPAX) 40 MG tablet Take 1 tab onset of migraine and repeat in 2 hours if needed. (max 2/24hr) Patient taking differently: Take 40 mg by mouth See admin instructions. Take 40 mg by mouth at onset of migraine- may repeat once in two hours, if no relief (Max 2 tablets/24 hours) 03/17/19  Yes Lomax, Amy, NP  gabapentin (NEURONTIN) 100 MG capsule Take 2 capsules (200 mg total) by mouth at bedtime. Patient taking differently: Take 200 mg by mouth daily.  01/20/19  Yes Lomax, Amy, NP  levonorgestrel (MIRENA) 20 MCG/24HR IUD 1 each by Intrauterine route once.   Yes [provider]  loratadine (CLARITIN) 10 MG tablet Take 10 mg by mouth daily as needed for allergies or rhinitis.   Yes [provider]  ondansetron (ZOFRAN) 4 MG tablet Take 1 tablet (4 mg total) by mouth every 8 (eight) hours as needed for nausea or vomiting. 12/03/18  Yes Lomax, Amy, NP  topiramate  (TOPAMAX) 200 MG tablet Take 1 tablet (200 mg total) by mouth at bedtime. 12/01/18  Yes Ward, Delice Bison, DO  triamcinolone cream (KENALOG) 0.1 % Apply 1 application topically in the morning and at bedtime. 04/17/19  Yes [provider]  valACYclovir (VALTREX) 500 MG tablet Take 500 mg by mouth 2 (two) times daily as needed (as needed for lesions).   Yes [provider]  predniSONE (DELTASONE) 20 MG tablet Take 2 tablets (40 mg total) by mouth daily with breakfast. For the next four days 04/22/19   Carmin Muskrat, MD    Allergies    Amoxicillin, Aspirin, Ibuprofen, and Adhesive [tape]  Review of Systems   Review of Systems  Constitutional:       Per HPI, otherwise negative  HENT:       Per HPI, otherwise negative  Respiratory:       Per HPI, otherwise negative  Cardiovascular:       Per HPI, otherwise negative  Gastrointestinal: Negative for vomiting.  Endocrine:       Negative aside from HPI  Genitourinary:       Neg aside from HPI   Musculoskeletal:       Per HPI, otherwise negative  Skin: Negative.   Neurological: Negative for syncope.    Physical Exam Updated Vital Signs BP 120/76   Pulse (!) 54   Temp 98.2 F (36.8 C) (Oral)   Resp 15   LMP 04/12/2019   SpO2 100%   Physical Exam Vitals and nursing note reviewed.  Constitutional:      General: She is not in acute distress.    Appearance: She is well-developed.  HENT:     Head: Normocephalic and atraumatic.  Eyes:     Conjunctiva/sclera: Conjunctivae normal.  Cardiovascular:     Rate and Rhythm: Normal rate and regular rhythm.  Pulmonary:     Effort: Pulmonary effort is normal. No respiratory distress.     Breath sounds: Normal breath sounds. No stridor.  Abdominal:     General: There is no distension.  Skin:    General: Skin is warm and dry.  Neurological:     Mental Status: She is alert and oriented to person, place, and time.     Cranial Nerves: No cranial nerve deficit.     ED  Results / Procedures / Treatments   Labs (all labs ordered are listed, but only abnormal results are displayed) Labs Reviewed  BASIC METABOLIC PANEL -  Abnormal; Notable for the following components:      Result Value   Chloride 112 (*)    CO2 19 (*)    Creatinine, Ser 1.16 (*)    All other components within normal limits  CBC  TROPONIN I (HIGH SENSITIVITY)  TROPONIN I (HIGH SENSITIVITY)    EKG EKG Interpretation  Date/Time:  Tuesday April 22 2019 12:01:25 EDT Ventricular Rate:  72 PR Interval:  150 QRS Duration: 90 QT Interval:  374 QTC Calculation: 409 R Axis:   70 Text Interpretation: Normal sinus rhythm with sinus arrhythmia T wave abnormality Abnormal ECG Confirmed by Carmin Muskrat [id number] on 04/22/2019 12:19:36 PM Also confirmed by Carmin Muskrat ([id number]), editor Hattie Perch [phone])  on 04/22/2019 1:38:52 PM   Radiology DG Chest 2 View  Result Date: 04/22/2019 CLINICAL DATA:  Chest pain EXAM: CHEST - 2 VIEW COMPARISON:  04/14/2019 FINDINGS: The heart size and mediastinal contours are within normal limits. Both lungs are clear. The visualized skeletal structures are unremarkable. IMPRESSION: No acute abnormality of the lungs. Electronically Signed   By: Eddie Candle M.D.   On: 04/22/2019 12:56    Procedures Procedures (including critical care time)  Medications Ordered in ED Medications  sodium chloride flush (NS) 0.9 % injection 3 mL (has no administration in time range)  predniSONE (DELTASONE) tablet 60 mg (has no administration in time range)    ED Course  I have reviewed the triage vital signs and the nursing notes.  Pertinent labs & imaging results that were available during my care of the patient were reviewed by me and considered in my medical decision making (see chart for details).   After initial evaluation I read the patient's notes from recent hospitalization.  Unremarkable troponin, dimer, x-ray, labs.  3:22 PM .Patient awake, alert, in no  distress, speaking clearly, breathing easily with no increased effort.  She is not hypoxic, tachycardic, and findings are reassuring, similar to those of last week.  She and I discussed possibilities for her chest discomfort, including ongoing inflammation, possibly secondary to prior nodule versus other inflammatory conditions. She has no wheezing, no fever, no cough, no negation for antibiotics or bronchodilators.  On chart review it looks like she may have been on acetazolamide, suggesting possible prior consideration of pulmonary hypertension.  Patient is appropriate for follow-up with her physician in this regard as well.   Final Clinical Impression(s) / ED Diagnoses Final diagnoses:  Atypical chest pain    Rx / DC Orders ED Discharge Orders         Ordered    predniSONE (DELTASONE) 20 MG tablet  Daily with breakfast     04/22/19 1521           Carmin Muskrat, MD 04/22/19 1524

## 2019-04-23 ENCOUNTER — Encounter: Payer: Self-pay | Admitting: Cardiology

## 2019-04-23 ENCOUNTER — Ambulatory Visit: Payer: BC Managed Care – PPO | Admitting: Cardiology

## 2019-04-23 VITALS — BP 122/70 | HR 64 | Ht 66.5 in | Wt 148.8 lb

## 2019-04-23 DIAGNOSIS — R0602 Shortness of breath: Secondary | ICD-10-CM

## 2019-04-23 DIAGNOSIS — R0789 Other chest pain: Secondary | ICD-10-CM

## 2019-04-23 MED ORDER — METOPROLOL TARTRATE 100 MG PO TABS: 100.0000 mg | ORAL_TABLET | Freq: Once | ORAL | 0 refills | Status: DC

## 2019-04-23 NOTE — Patient Instructions (Addendum)
Medication Instructions:  Your physician recommends that you continue on your current medications as directed. Please refer to the Current Medication list given to you today.  *If you need a refill on your cardiac medications before your next appointment, please call your pharmacy*   Testing/Procedures: Your physician has requested that you have an echocardiogram. Echocardiography is a painless test that uses sound waves to create images of your heart. It provides your doctor with information about the size and shape of your heart and how well your heart's chambers and valves are working. This procedure takes approximately one hour. There are no restrictions for this procedure.   Follow-Up: At [facility], you and your health needs are our priority.  As part of our continuing mission to provide you with exceptional heart care, we have created designated Provider Care Teams.  These Care Teams include your primary Cardiologist (physician) and Advanced Practice Providers (APPs -  Physician Assistants and Nurse Practitioners) who all work together to provide you with the care you need, when you need it.  Follow up with Dr. Marlou Porch as needed based on results of testing.    Other Instructions Your cardiac CT will be scheduled at one of the below locations:   [facility] [address] [place], [place] [address] [phone]  [facility] [address] [place], [place] [address] [phone]  If scheduled at [facility], please arrive at the [facility] main entrance of [facility] 30 minutes prior to test start time. Proceed to the [facility] Radiology Department (first floor) to check-in and test prep.  If scheduled at [facility], please arrive 15 mins early for check-in and test prep.  Please follow these instructions carefully (unless otherwise directed):   On the Night  Before the Test: . Be sure to Drink plenty of water. . Do not consume any caffeinated/decaffeinated beverages or chocolate 12 hours prior to your test. . Do not take any antihistamines 12 hours prior to your test.  On the Day of the Test: . Drink plenty of water. Do not drink any water within one hour of the test. . Do not eat any food 4 hours prior to the test. . You may take your regular medications prior to the test.  . Take metoprolol (Lopressor) two hours prior to test. . FEMALES- please wear underwire-free bra if available       After the Test: . Drink plenty of water. . After receiving IV contrast, you may experience a mild flushed feeling. This is normal. . On occasion, you may experience a mild rash up to 24 hours after the test. This is not dangerous. If this occurs, you can take Benadryl 25 mg and increase your fluid intake. . If you experience trouble breathing, this can be serious. If it is severe call 911 IMMEDIATELY. If it is mild, please call our office. . If you take any of these medications: Glipizide/Metformin, Avandament, Glucavance, please do not take 48 hours after completing test unless otherwise instructed.   Once we have confirmed authorization from your insurance company, we will call you to set up a date and time for your test.   For non-scheduling related questions, please contact the cardiac imaging nurse navigator should you have any questions/concerns: Marchia Bond, RN Navigator Cardiac Imaging [facility] [phone] office  For scheduling needs, including cancellations and rescheduling, please call [phone].

## 2019-04-23 NOTE — Progress Notes (Signed)
Cardiology Office Note:    Date:  04/23/2019   ID:  [name], DOB 22-Mar-1980, MRN [id number]  PCP:  Seward Carol, MD  Cardiologist:  Candee Furbish, MD  Electrophysiologist:  None   Referring MD: Seward Carol, MD     History of Present Illness:    [name] is a 39 y.o. female here for the evaluation of chest pain, dyspnea at the request of Dr. Delfina Redwood.  She was in the ER yesterday 04/22/2019.  She has been noting the symptoms for about 3 weeks duration.  Ongoing discomfort for 3 weeks.  Squeezing sensation through her chest no dyspnea.  No cough.  No prior history of cardiac disease.  Recent excision of skin tumor.  She remembers being told that she had possible lesion in her lungs as well (see below for details).  She has a follow-up CT scan in the coming months.  Overall she feels poorly.  She has a sensation as though she is not able to get any good breath.  She feels chest tightness and squeezing.  She wants to feel back to normal.  Chest x-ray from 04/22/2019 personally reviewed was normal.  No cardiac abnormalities Abnormalities. EKG reviewed from 04/12/2019 shows sinus rhythm with nonspecific ST-T wave changes.    Past Medical History:  Diagnosis Date  . Asthma   . Cancer ([place])    cancer in my leg  . Common migraine with intractable migraine 05/11/2016  . Hypertension   . Migraines   . PCOS (polycystic ovarian syndrome)   . Seizures ([place])    c/b anesthesia    Past Surgical History:  Procedure Laterality Date  . CESAREAN SECTION     one previous  . HERNIA REPAIR    . NASAL RECONSTRUCTION      Current Medications: Current Meds  Medication Sig  . acetaminophen (TYLENOL) 650 MG CR tablet Take 650-1,300 mg by mouth 2 (two) times daily as needed for pain.  Marland Kitchen acetaZOLAMIDE (DIAMOX) 250 MG tablet Take 3 tablets (750 mg total) by mouth 2 (two) times daily.  . cetirizine (ZYRTEC) 10 MG tablet Take 10 mg by mouth daily as needed for allergies or rhinitis.  Marland Kitchen  diphenhydrAMINE (BENADRYL CHILDRENS ALLERGY) 12.5 MG/5ML liquid Take 25 mg by mouth at bedtime as needed for sleep.  Marland Kitchen eletriptan (RELPAX) 40 MG tablet Take 1 tab onset of migraine and repeat in 2 hours if needed. (max 2/24hr)  . gabapentin (NEURONTIN) 100 MG capsule Take 2 capsules (200 mg total) by mouth at bedtime.  Marland Kitchen levonorgestrel (MIRENA) 20 MCG/24HR IUD 1 each by Intrauterine route once.  . loratadine (CLARITIN) 10 MG tablet Take 10 mg by mouth daily as needed for allergies or rhinitis.  Marland Kitchen ondansetron (ZOFRAN) 4 MG tablet Take 1 tablet (4 mg total) by mouth every 8 (eight) hours as needed for nausea or vomiting.  . predniSONE (DELTASONE) 20 MG tablet Take 2 tablets (40 mg total) by mouth daily with breakfast. For the next four days  . topiramate (TOPAMAX) 200 MG tablet Take 1 tablet (200 mg total) by mouth at bedtime.  . triamcinolone cream (KENALOG) 0.1 % Apply 1 application topically in the morning and at bedtime.  . valACYclovir (VALTREX) 500 MG tablet Take 500 mg by mouth 2 (two) times daily as needed (as needed for lesions).     Allergies:   Amoxicillin, Aspirin, Ibuprofen, and Adhesive [tape]   Social History   Socioeconomic History  . Marital status: Married    Spouse name:  Not on file  . Number of children: Not on file  . Years of education: Not on file  . Highest education level: Not on file  Occupational History  . Not on file  Tobacco Use  . Smoking status: Never Smoker  . Smokeless tobacco: Never Used  Substance and Sexual Activity  . Alcohol use: Yes    Comment: occasionally  . Drug use: No  . Sexual activity: Yes    Birth control/protection: Pill  Other Topics Concern  . Not on file  Social History Narrative  . Not on file   Social Determinants of Health   Financial Resource Strain:   . Difficulty of Paying Living Expenses:   Food Insecurity:   . Worried About Charity fundraiser in the Last Year:   . Arboriculturist in the Last Year:   Transportation  Needs:   . Film/video editor (Medical):   Marland Kitchen Lack of Transportation (Non-Medical):   Physical Activity:   . Days of Exercise per Week:   . Minutes of Exercise per Session:   Stress:   . Feeling of Stress :   Social Connections:   . Frequency of Communication with Friends and Family:   . Frequency of Social Gatherings with Friends and Family:   . Attends Religious Services:   . Active Member of Clubs or Organizations:   . Attends Archivist Meetings:   Marland Kitchen Marital Status:      Family History: The patient's family history includes Breast cancer in her maternal aunt; Cancer in her maternal aunt; Diabetes in her maternal grandmother and paternal grandfather.  ROS:   Please see the history of present illness.    No fevers chills nausea vomiting syncope bleeding all other systems reviewed and are negative.  EKGs/Labs/Other Studies Reviewed:    The following studies were reviewed today:  01/07/2019 CT chest:  Impression:  1. No definite evidence of metastatic disease in the chest. 2. Sub-6 mm pulmonary nodule, technically indeterminant. Attention on follow up per clinical protocol.   Electronically Reviewed by: Thomasene Ripple, MD, [facility]  EKG:  EKG is not ordered today.  Prior above  Recent Labs: 11/23/2018: ALT 17 04/14/2019: B Natriuretic Peptide 37.5 04/22/2019: BUN 20; Creatinine, Ser 1.16; Hemoglobin 13.8; Platelets 257; Potassium 3.7; Sodium 140  Recent Lipid Panel No results found for: CHOL, TRIG, HDL, CHOLHDL, VLDL, LDLCALC, LDLDIRECT  Physical Exam:    VS:  BP 122/70   Pulse 64   Ht 5' 6.5" (1.689 m)   Wt 148 lb 12.8 oz (67.5 kg)   LMP 04/12/2019   SpO2 98%   BMI 23.66 kg/m     Wt Readings from Last 3 Encounters:  04/23/19 148 lb 12.8 oz (67.5 kg)  04/14/19 148 lb (67.1 kg)  03/17/19 149 lb 9.6 oz (67.9 kg)     GEN:  Well nourished, well developed in no acute distress HEENT: Normal NECK: No JVD; No carotid bruits LYMPHATICS: No  lymphadenopathy CARDIAC: RRR, no murmurs, rubs, gallops RESPIRATORY:  Clear to auscultation without rales, wheezing or rhonchi  ABDOMEN: Soft, non-tender, non-distended MUSCULOSKELETAL:  No edema; No deformity  SKIN: Warm and dry NEUROLOGIC:  Alert and oriented x 3 PSYCHIATRIC: Somber affect   ASSESSMENT:    1. Atypical chest pain   2. Shortness of breath    PLAN:    In order of problems listed above:  Atypical chest pain, dyspnea -We will go ahead and check an echocardiogram to ensure that  she does not have any abnormalities with structure and function of her heart. -I will also check a coronary CT scan to ensure no coronary anomalies or plaque. -I reviewed her CT scan from [place] on 01/07/2019.  3 mm left lower lobe perifissural nodule noted.  This will be followed up with.  In April she sees the [facility] oncologist once again. -I sympathize with the way she is feeling, wants to be back to the way she was she states.  She has an 41-year-old son.  Hopefully I am able to give her reassurance from a cardiac perspective.   Medication Adjustments/Labs and Tests Ordered: Current medicines are reviewed at length with the patient today.  Concerns regarding medicines are outlined above.  Orders Placed This Encounter  Procedures  . CT CORONARY MORPH W/CTA COR W/SCORE W/CA W/CM &/OR WO/CM  . CT CORONARY FRACTIONAL FLOW RESERVE DATA PREP  . CT CORONARY FRACTIONAL FLOW RESERVE FLUID ANALYSIS  . Basic metabolic panel  . ECHOCARDIOGRAM COMPLETE   Meds ordered this encounter  Medications  . metoprolol tartrate (LOPRESSOR) 100 MG tablet    Sig: Take 1 tablet (100 mg total) by mouth once for 1 dose. Take 1 tablet (100 mg) two hours prior to CT scan.    Dispense:  1 tablet    Refill:  0    Patient Instructions  Medication Instructions:  Your physician recommends that you continue on your current medications as directed. Please refer to the Current Medication list given to you today.  *If you  need a refill on your cardiac medications before your next appointment, please call your pharmacy*   Testing/Procedures: Your physician has requested that you have an echocardiogram. Echocardiography is a painless test that uses sound waves to create images of your heart. It provides your doctor with information about the size and shape of your heart and how well your heart's chambers and valves are working. This procedure takes approximately one hour. There are no restrictions for this procedure.   Follow-Up: At [facility], you and your health needs are our priority.  As part of our continuing mission to provide you with exceptional heart care, we have created designated Provider Care Teams.  These Care Teams include your primary Cardiologist (physician) and Advanced Practice Providers (APPs -  Physician Assistants and Nurse Practitioners) who all work together to provide you with the care you need, when you need it.  Follow up with Dr. Marlou Porch as needed based on results of testing.    Other Instructions Your cardiac CT will be scheduled at one of the below locations:   [facility] [address] [place], [place] [address] [phone]  [facility] [address] [place], [place] [address] [phone]  If scheduled at [facility], please arrive at the [facility] main entrance of [facility] 30 minutes prior to test start time. Proceed to the [facility] Department (first floor) to check-in and test prep.  If scheduled at [facility], please arrive 15 mins early for check-in and test prep.  Please follow these instructions carefully (unless otherwise directed):   On the Night Before the Test: . Be sure to Drink plenty of water. . Do not consume any caffeinated/decaffeinated beverages or chocolate 12 hours prior to your test. . Do not take any antihistamines 12  hours prior to your test.  On the Day of the Test: . Drink plenty of water. Do  not drink any water within one hour of the test. . Do not eat any food 4 hours prior to the test. . You may take your regular medications prior to the test.  . Take metoprolol (Lopressor) two hours prior to test. . FEMALES- please wear underwire-free bra if available       After the Test: . Drink plenty of water. . After receiving IV contrast, you may experience a mild flushed feeling. This is normal. . On occasion, you may experience a mild rash up to 24 hours after the test. This is not dangerous. If this occurs, you can take Benadryl 25 mg and increase your fluid intake. . If you experience trouble breathing, this can be serious. If it is severe call 911 IMMEDIATELY. If it is mild, please call our office. . If you take any of these medications: Glipizide/Metformin, Avandament, Glucavance, please do not take 48 hours after completing test unless otherwise instructed.   Once we have confirmed authorization from your insurance company, we will call you to set up a date and time for your test.   For non-scheduling related questions, please contact the cardiac imaging nurse navigator should you have any questions/concerns: Marchia Bond, RN Navigator Cardiac Imaging [facility] [phone] office  For scheduling needs, including cancellations and rescheduling, please call [phone].      Signed, Candee Furbish, MD  04/23/2019 5:06 PM    [facility]

## 2019-05-08 ENCOUNTER — Other Ambulatory Visit: Payer: Self-pay

## 2019-05-08 ENCOUNTER — Ambulatory Visit (HOSPITAL_COMMUNITY): Payer: BC Managed Care – PPO | Attending: Internal Medicine

## 2019-05-08 DIAGNOSIS — R0602 Shortness of breath: Secondary | ICD-10-CM | POA: Diagnosis not present

## 2019-05-20 DIAGNOSIS — Z01419 Encounter for gynecological examination (general) (routine) without abnormal findings: Secondary | ICD-10-CM | POA: Diagnosis not present

## 2019-05-28 ENCOUNTER — Encounter (HOSPITAL_COMMUNITY): Payer: Self-pay

## 2019-05-28 ENCOUNTER — Telehealth (HOSPITAL_COMMUNITY): Payer: Self-pay | Admitting: Emergency Medicine

## 2019-05-28 NOTE — Telephone Encounter (Signed)
Reaching out to patient to offer assistance regarding upcoming cardiac imaging study; pt verbalizes understanding of appt date/time, parking situation and where to check in, pre-test NPO status and medications ordered, and verified current allergies; name and call back number provided for further questions should they arise Marchia Bond RN [facility] [phone] office [phone] cell   Pt states difficult to get IVs on her. I encoure also statesaged PO hydration prior to test, sh that IV typically is needed.

## 2019-05-29 ENCOUNTER — Ambulatory Visit (HOSPITAL_COMMUNITY)
Admission: RE | Admit: 2019-05-29 | Discharge: 2019-05-29 | Disposition: A | Discharge status: Home / Self Care | Payer: BC Managed Care – PPO | Source: Ambulatory Visit | Attending: Cardiology | Admitting: Cardiology

## 2019-05-29 ENCOUNTER — Other Ambulatory Visit: Payer: Self-pay

## 2019-05-29 ENCOUNTER — Encounter: Payer: BC Managed Care – PPO | Admitting: *Deleted

## 2019-05-29 DIAGNOSIS — Z006 Encounter for examination for normal comparison and control in clinical research program: Secondary | ICD-10-CM

## 2019-05-29 DIAGNOSIS — R0602 Shortness of breath: Secondary | ICD-10-CM | POA: Diagnosis not present

## 2019-05-29 DIAGNOSIS — R0789 Other chest pain: Secondary | ICD-10-CM | POA: Diagnosis not present

## 2019-05-29 MED ORDER — NITROGLYCERIN 0.4 MG SL SUBL
SUBLINGUAL_TABLET | SUBLINGUAL | Status: AC
  Filled 2019-05-29: qty 2

## 2019-05-29 MED ORDER — NITROGLYCERIN 0.4 MG SL SUBL
0.8000 mg | SUBLINGUAL_TABLET | Freq: Once | SUBLINGUAL | Status: AC
  Administered 2019-05-29: 0.8 mg via SUBLINGUAL

## 2019-05-29 MED ORDER — IOHEXOL 350 MG/ML SOLN
80.0000 mL | Freq: Once | INTRAVENOUS | Status: AC | PRN
  Administered 2019-05-29: 80 mL via INTRAVENOUS

## 2019-05-29 NOTE — Research (Signed)
CADFEM Informed Consent                  Subject Name:   [name]   Subject met inclusion and exclusion criteria.  The informed consent form, study requirements and expectations were reviewed with the subject and questions and concerns were addressed prior to the signing of the consent form.  The subject verbalized understanding of the trial requirements.  The subject agreed to participate in the CADFEM trial and signed the informed consent.  The informed consent was obtained prior to performance of any protocol-specific procedures for the subject.  A copy of the signed informed consent was given to the subject and a copy was placed in the subject's medical record.   Kenya Chalmers, Research Assistant  05/29/2019  09:23 a.m. 

## 2019-05-30 DIAGNOSIS — C44702 Unspecified malignant neoplasm of skin of right lower limb, including hip: Secondary | ICD-10-CM | POA: Diagnosis not present

## 2019-06-30 ENCOUNTER — Telehealth: Payer: Self-pay | Admitting: Cardiology

## 2019-06-30 DIAGNOSIS — C44702 Unspecified malignant neoplasm of skin of right lower limb, including hip: Secondary | ICD-10-CM | POA: Diagnosis not present

## 2019-06-30 DIAGNOSIS — R911 Solitary pulmonary nodule: Secondary | ICD-10-CM | POA: Diagnosis not present

## 2019-06-30 NOTE — Telephone Encounter (Signed)
Per Dr. Tamala Julian, DOD, pt is cleared for dental cleaning, xrays and any future dental work.  Will fax to requesting office.

## 2019-06-30 NOTE — Telephone Encounter (Signed)
New Message    1. What dental office are you calling from? [facility]   2. What is your office phone number? [phone]  3. What is your fax number? [phone]  4. What type of procedure is the patient having performed? Cleaning, also wants to know if pt can have xrays, or any future dental work   5. What date is procedure scheduled or is the patient there now? There for a cleaning now.   (if the patient is at the dentist's office question goes to their cardiologist if he/she is in the office.  If not, question should go to the DOD).   6. What is your question (ex. Antibiotics prior to procedure, holding medication-we need to know how long dentist wants pt to hold med)? Dentist is wanting to know if pt needs to pre med

## 2019-07-09 ENCOUNTER — Ambulatory Visit: Payer: BLUE CROSS/BLUE SHIELD | Admitting: Adult Health

## 2019-07-24 ENCOUNTER — Other Ambulatory Visit (HOSPITAL_COMMUNITY): Payer: Self-pay | Admitting: Internal Medicine

## 2019-07-24 DIAGNOSIS — R1031 Right lower quadrant pain: Secondary | ICD-10-CM

## 2019-07-25 ENCOUNTER — Ambulatory Visit (HOSPITAL_COMMUNITY): Payer: BC Managed Care – PPO

## 2019-07-25 ENCOUNTER — Ambulatory Visit (HOSPITAL_COMMUNITY)
Admission: RE | Admit: 2019-07-25 | Discharge: 2019-07-25 | Disposition: A | Discharge status: Home / Self Care | Payer: BC Managed Care – PPO | Source: Ambulatory Visit | Attending: Internal Medicine | Admitting: Internal Medicine

## 2019-07-25 ENCOUNTER — Other Ambulatory Visit: Payer: Self-pay

## 2019-07-25 ENCOUNTER — Encounter (HOSPITAL_COMMUNITY): Payer: Self-pay

## 2019-07-25 DIAGNOSIS — R1031 Right lower quadrant pain: Secondary | ICD-10-CM | POA: Diagnosis not present

## 2019-08-07 ENCOUNTER — Other Ambulatory Visit: Payer: Self-pay | Admitting: *Deleted

## 2019-08-07 MED ORDER — TOPIRAMATE 200 MG PO TABS: 200.0000 mg | ORAL_TABLET | Freq: Every day | ORAL | 1 refills | Status: DC

## 2019-08-07 NOTE — Telephone Encounter (Signed)
Called pt because we received refill request for topamax 200mg  po qhs via fax from her pharmacy. Do not see where AL,NP has prescribed this previously in epic.Pt confirmed this is the dose she is taking and states AL,NP is the provider on her prescription bottle that she most recently has. #90. Per AL,NP last OV note, she advised pt to continue topamax.

## 2019-08-18 ENCOUNTER — Encounter: Payer: Self-pay | Admitting: Family Medicine

## 2019-08-19 MED ORDER — GABAPENTIN 100 MG PO CAPS: 200.0000 mg | ORAL_CAPSULE | Freq: Every day | ORAL | 5 refills | Status: DC

## 2019-09-15 ENCOUNTER — Encounter: Payer: Self-pay | Admitting: Family Medicine

## 2019-09-15 ENCOUNTER — Ambulatory Visit (INDEPENDENT_AMBULATORY_CARE_PROVIDER_SITE_OTHER): Payer: BC Managed Care – PPO | Admitting: Family Medicine

## 2019-09-15 VITALS — BP 138/84 | HR 53 | Ht 66.0 in | Wt 149.0 lb

## 2019-09-15 DIAGNOSIS — G932 Benign intracranial hypertension: Secondary | ICD-10-CM

## 2019-09-15 NOTE — Patient Instructions (Addendum)
Continue Diamox, topiramate and gabapentin as prescribed. Continue eletriptan for migraines.   Continue healthy lifestyle habits. Stay well hydrated.   I would recommend scheduling follow up with opthalmology in the next 6 months   Follow up in 1 year, sooner if needed     Idiopathic Intracranial Hypertension  Idiopathic intracranial hypertension (IIH) is a condition that increases pressure around the brain. The fluid that surrounds the brain and spinal cord (cerebrospinal fluid, CSF) increases and causes the pressure. Idiopathic means that the cause of this condition is not known. IIH affects the brain and spinal cord (is a neurological disorder). If this condition is not treated, it can cause vision loss or blindness. What increases the risk? You are more likely to develop this condition if:  You are severely overweight (obese).  You are a woman who has not gone through menopause.  You take certain medicines, such as birth control or steroids. What are the signs or symptoms? Symptoms of IIH include:  Headaches. This is the most common symptom.  Pain in the shoulders or neck.  Nausea and vomiting.  A "rushing water" or pulsing sound within the ears (pulsatile tinnitus).  Double vision.  Blurred vision.  Brief episodes of complete vision loss. How is this diagnosed? This condition may be diagnosed based on:  Your symptoms.  Your medical history.  CT scan of the brain.  MRI of the brain.  Magnetic resonance venogram (MRV) to check veins in the brain.  Diagnostic lumbar puncture. This is a procedure to remove and examine a sample of cerebrospinal fluid. This procedure can determine whether too much fluid may be causing IIH.  A thorough eye exam to check for swelling or nerve damage in the eyes. How is this treated? Treatment for this condition depends on your symptoms. The goal of treatment is to decrease the pressure around your brain. Common treatments  include:  Medicines to decrease the production of spinal fluid and lower the pressure within your skull.  Medicines to prevent or treat headaches.  Surgery to place drains (shunts) in your brain to remove excess fluid.  Lumbar puncture to remove excess cerebrospinal fluid. Follow these instructions at home:  If you are overweight or obese, work with your health care provider to lose weight.  Take over-the-counter and prescription medicines only as told by your health care provider.  Do not drive or use heavy machinery while taking medicines that can make you sleepy.  Keep all follow-up visits as told by your health care provider. This is important. Contact a health care provider if:  You have changes in your vision, such as: ? Double vision. ? Not being able to see colors (color vision). Get help right away if:  You have any of the following symptoms and they get worse or do not get better. ? Headaches. ? Nausea. ? Vomiting. ? Vision changes or difficulty seeing. Summary  Idiopathic intracranial hypertension (IIH) is a condition that increases pressure around the brain. The cause is not known (is idiopathic).  The most common symptom of IIH is headaches.  Treatment may include medicines or surgery to relieve the pressure on your brain. This information is not intended to replace advice given to you by your health care provider. Make sure you discuss any questions you have with your health care provider. Document Revised: 01/05/2017 Document Reviewed: 12/15/2015 Elsevier Patient Education  [place].   Migraine Headache A migraine headache is a very strong throbbing pain on one side  or both sides of your head. This type of headache can also cause other symptoms. It can last from 4 hours to 3 days. Talk with your doctor about what things may bring on (trigger) this condition. What are the causes? The exact cause of this condition is not known. This condition may be  triggered or caused by:  Drinking alcohol.  Smoking.  Taking medicines, such as: ? Medicine used to treat chest pain (nitroglycerin). ? Birth control pills. ? Estrogen. ? Some blood pressure medicines.  Eating or drinking certain products.  Doing physical activity. Other things that may trigger a migraine headache include:  Having a menstrual period.  Pregnancy.  Hunger.  Stress.  Not getting enough sleep or getting too much sleep.  Weather changes.  Tiredness (fatigue). What increases the risk?  Being 50-70 years old.  Being female.  Having a family history of migraine headaches.  Being Caucasian.  Having depression or anxiety.  Being very overweight. What are the signs or symptoms?  A throbbing pain. This pain may: ? Happen in any area of the head, such as on one side or both sides. ? Make it hard to do daily activities. ? Get worse with physical activity. ? Get worse around bright lights or loud noises.  Other symptoms may include: ? Feeling sick to your stomach (nauseous). ? Vomiting. ? Dizziness. ? Being sensitive to bright lights, loud noises, or smells.  Before you get a migraine headache, you may get warning signs (an aura). An aura may include: ? Seeing flashing lights or having blind spots. ? Seeing bright spots, halos, or zigzag lines. ? Having tunnel vision or blurred vision. ? Having numbness or a tingling feeling. ? Having trouble talking. ? Having weak muscles.  Some people have symptoms after a migraine headache (postdromal phase), such as: ? Tiredness. ? Trouble thinking (concentrating). How is this treated?  Taking medicines that: ? Relieve pain. ? Relieve the feeling of being sick to your stomach. ? Prevent migraine headaches.  Treatment may also include: ? Having acupuncture. ? Avoiding foods that bring on migraine headaches. ? Learning ways to control your body functions (biofeedback). ? Therapy to help you know and  deal with negative thoughts (cognitive behavioral therapy). Follow these instructions at home: Medicines  Take over-the-counter and prescription medicines only as told by your doctor.  Ask your doctor if the medicine prescribed to you: ? Requires you to avoid driving or using heavy machinery. ? Can cause trouble pooping (constipation). You may need to take these steps to prevent or treat trouble pooping:  Drink enough fluid to keep your pee (urine) pale yellow.  Take over-the-counter or prescription medicines.  Eat foods that are high in fiber. These include beans, whole grains, and fresh fruits and vegetables.  Limit foods that are high in fat and sugar. These include fried or sweet foods. Lifestyle  Do not drink alcohol.  Do not use any products that contain nicotine or tobacco, such as cigarettes, e-cigarettes, and chewing tobacco. If you need help quitting, ask your doctor.  Get at least 8 hours of sleep every night.  Limit and deal with stress. General instructions      Keep a journal to find out what may bring on your migraine headaches. For example, write down: ? What you eat and drink. ? How much sleep you get. ? Any change in what you eat or drink. ? Any change in your medicines.  If you have a migraine headache: ? Avoid  things that make your symptoms worse, such as bright lights. ? It may help to lie down in a dark, quiet room. ? Do not drive or use heavy machinery. ? Ask your doctor what activities are safe for you.  Keep all follow-up visits as told by your doctor. This is important. Contact a doctor if:  You get a migraine headache that is different or worse than others you have had.  You have more than 15 headache days in one month. Get help right away if:  Your migraine headache gets very bad.  Your migraine headache lasts longer than 72 hours.  You have a fever.  You have a stiff neck.  You have trouble seeing.  Your muscles feel weak or  like you cannot control them.  You start to lose your balance a lot.  You start to have trouble walking.  You pass out (faint).  You have a seizure. Summary  A migraine headache is a very strong throbbing pain on one side or both sides of your head. These headaches can also cause other symptoms.  This condition may be treated with medicines and changes to your lifestyle.  Keep a journal to find out what may bring on your migraine headaches.  Contact a doctor if you get a migraine headache that is different or worse than others you have had.  Contact your doctor if you have more than 15 headache days in a month. This information is not intended to replace advice given to you by your health care provider. Make sure you discuss any questions you have with your health care provider. Document Revised: 05/17/2018 Document Reviewed: 03/07/2018 Elsevier Patient Education  [place].

## 2019-09-15 NOTE — Progress Notes (Signed)
PATIENT: [name] DOB: 1980-10-25  REASON FOR VISIT: follow up HISTORY FROM: patient  Chief Complaint  Patient presents with  . Follow-up    RM 7 here for a f/u on IIH. Pt is having no new sx.     HISTORY OF PRESENT ILLNESS: Today 09/15/19 [name] is a 39 y.o. female here today for follow up of IIH. She continues Diamox 750mg  BID, topiramate 200mg  and gabapentin 200mg  at bedtime. She uses eletriptan for abortive therapy. She denies any concerns of headaches. She may have a mild headache from time to time. She is doing well. She reports from time to time, she will have twitching of her eye but it resolves spontaneously. She stays well hydrated. She was last seen by ophthalmology in 03/2019. She does not have scheduled follow up.    HISTORY: (copied from my note on 03/17/2019)  [name] is a 39 y.o. female here today for follow up of IIH. She continues Diamox 750mg  twice daily, topiramate 200mg  and gabapentin 200mg  at bedtime. She reports that she has done well until last week. She started having a mild headache about 7 days ago. Headache was centered at the top of her head. Pain was dull aching pain. No light or sound sensitivity but she did have intermittent nausea. Vision was blurry when headache worsened but no vision loss. She reports that headache progressed for 3-4 days. She took one dose of eletriptan on Friday and two doses on Saturday. Sunday the headache was improved. Today headache is better. Blurry vision is significantly improved. She does report allergy symptoms as well. She has not taken allergy medication. She is not taking Excedrin. She is eating well and drinking plenty of water.   HISTORY: (copied from my note on 01/16/2019)  [name] a 39 y.o.femalehere today for follow up of IIH. She continues Diamox 750mg  twice daily, topiramate 200mg  and gabapentin 200mg  at bedtime daily.She reports that headaches did improve with increased dose of  Diamox. About three days ago, she felt a headache starting. She has right sided pressure, worse behind right eye.She was seen yesterday by ER for acute intractable headache treated with metoclopramide and dexamethasone 10mg .She has noted some improvement today but headache continues. She did forget to tell UC provider yesterday that for a period of time, she felt as if she could only see black in the right outer vision fields. Vision is normal today with exception of mild blurriness with significant pain. She has felt nauseated as well and noticed light sensitivity driving to our appt today. She has not taken any OTC medications or Relpax as she was unsure if this would help. She is usually cautious about taking OTC medications.She uses Mirenafor severe dysmenorrhea. We have discussed possible role of Mirena in worsening IIH, however, data is conflicting. She is also followed very closely by Dr Nelda Marseille, gynecology. Patient feels benefit of controlled menstrual cycles outweighs risks of worsening IIH at this time.    HISTORY: (copied frommynote on 12/03/2018)  [name] a 39 y.o.femalehere today for follow up for headaches withnew diagnosis of IIH. She has been seen several times in the ER for worsening headaches since being seen by me in 06/2018. At that time, she reportedthat headaches were doing well on topiramate and gabapentin. Relpax worked well for abortive therapy. She started having worsening of central headaches with pressure behind her eyes about 2-3 weeks ago.She reports that her headache was persistent and unresponsive to therapy. Pressure behind right eye with  peripheral vision loss. Last week,LP showed opening pressure of 30.Closing pressure of 12 and headache improved. She was started on Diamox500mg  twice dailyand referred back to [place].She has tolerated Diamox without adverse effects over the past 10 days. She has continued topiramate 200mg  and gabapentin 100mg  at bedtime.  MRI was normal. She has noticed more trouble with her memory. She feels that her words are not coming to her. She has ringing in her ears. She is nauseated at times. She has not had an eye exam. She has Mirena for birth control placed about a year ago. Twin sister was diagnosed with IIH about 10 years ago.   HISTORY: (copied frommynote on 07/04/2018)  [name] a 39 y.o.femalefor follow up of migraines. She is taking topirimate 200mg and gabapentin 100mg at bedtime. She uses Relpax for abortive therapy. She does report sleepiness with Relpax.She has had about 3 migraines in the past year. She has 3-4 headaches per month. She feels that she is doing really well.   HISTORY(copied from [place] note on 10/29/2017  [name] a 39 year old female with a history of migraine headaches. She returns today for follow-up. She states overall her migraine headaches are better. She has not had a migraine in the last month. She states that she is not headaches at least 3 times a week. She typically can take Tylenol or Excedrin tension. She reports that she does not use Relpax as much because she needs to lay downafter she takes it. She states that she works different shifts at her job which also may be affecting her sleep.   HISTORY03/14/19 [name] a 39 year old female with a history of migraine. She is currently taking 200 mg at bedtime. She uses Relpax to treat her acute migraines. She reports that she hashad a ongoing headache for the last 3 weeks. She reports that her pain fluctuates from a 4 out of 10 to a 7 out of 10. She reports that the headache has been constant. She has used Tylenol as well as Relpax. She reports that these medications has decreased the severity but essentially did not resolve the headache. She reports theheadache usuallystartsin the center of the head and radiatesdown to the forehead. She reports with severe headachesshe will  have photophobia. She reports occasionally she will have halos in her vision. She confirmed that she is also had nausea and vomiting. She has been using Zofran. In the past she has had a prednisone Dosepak for that it works well for her headache. She returns today for evaluation.   REVIEW OF SYSTEMS: Out of a complete 14 system review of symptoms, the patient complains only of the following symptoms, headaches and all other reviewed systems are negative.  ALLERGIES: Allergies  Allergen Reactions  . Amoxicillin Hives, Nausea And Vomiting and Nausea Only    Has patient had a PCN reaction causing immediate rash, facial/tongue/throat swelling, SOB or lightheadedness with hypotension: Yes Has patient had a PCN reaction causing severe rash involving mucus membranes or skin necrosis: No Has patient had a PCN reaction that required hospitalization: No Has patient had a PCN reaction occurring within the last 10 years: No If all of the above answers are "NO", then may proceed with Cephalosporin use.   . Aspirin Hives, Nausea And Vomiting and Nausea Only  . Ibuprofen Hives, Nausea And Vomiting and Nausea Only  . Adhesive [Tape] Rash and Other (See Comments)    CERTAIN BANDAGES BREAK OUT THE SKIN !!    HOME MEDICATIONS: Outpatient Medications Prior to Visit  Medication Sig Dispense Refill  . acetaminophen (TYLENOL) 650 MG CR tablet Take 650-1,300 mg by mouth 2 (two) times daily as needed for pain.    Marland Kitchen acetaZOLAMIDE (DIAMOX) 250 MG tablet Take 3 tablets (750 mg total) by mouth 2 (two) times daily. 180 tablet 11  . cetirizine (ZYRTEC) 10 MG tablet Take 10 mg by mouth daily as needed for allergies or rhinitis.    Marland Kitchen diphenhydrAMINE (BENADRYL CHILDRENS ALLERGY) 12.5 MG/5ML liquid Take 25 mg by mouth at bedtime as needed for sleep.    Marland Kitchen eletriptan (RELPAX) 40 MG tablet Take 1 tab onset of migraine and repeat in 2 hours if needed. (max 2/24hr) 10 tablet 11  . gabapentin (NEURONTIN) 100 MG capsule  Take 2 capsules (200 mg total) by mouth at bedtime. 60 capsule 5  . levonorgestrel (MIRENA) 20 MCG/24HR IUD 1 each by Intrauterine route once.    . loratadine (CLARITIN) 10 MG tablet Take 10 mg by mouth daily as needed for allergies or rhinitis.    . metoprolol tartrate (LOPRESSOR) 100 MG tablet Take 1 tablet (100 mg total) by mouth once for 1 dose. Take 1 tablet (100 mg) two hours prior to CT scan. 1 tablet 0  . ondansetron (ZOFRAN) 4 MG tablet Take 1 tablet (4 mg total) by mouth every 8 (eight) hours as needed for nausea or vomiting. 30 tablet 3  . predniSONE (DELTASONE) 20 MG tablet Take 2 tablets (40 mg total) by mouth daily with breakfast. For the next four days 8 tablet 0  . topiramate (TOPAMAX) 200 MG tablet Take 1 tablet (200 mg total) by mouth at bedtime. 90 tablet 1  . triamcinolone cream (KENALOG) 0.1 % Apply 1 application topically in the morning and at bedtime.    . valACYclovir (VALTREX) 500 MG tablet Take 500 mg by mouth 2 (two) times daily as needed (as needed for lesions).     No facility-administered medications prior to visit.    PAST MEDICAL HISTORY: Past Medical History:  Diagnosis Date  . Asthma   . Cancer ([place])    cancer in my leg  . Common migraine with intractable migraine 05/11/2016  . Hypertension   . Migraines   . PCOS (polycystic ovarian syndrome)   . Seizures ([place])    c/b anesthesia    PAST SURGICAL HISTORY: Past Surgical History:  Procedure Laterality Date  . CESAREAN SECTION     one previous  . HERNIA REPAIR    . NASAL RECONSTRUCTION      FAMILY HISTORY: Family History  Problem Relation Age of Onset  . Breast cancer Maternal Aunt   . Diabetes Maternal Grandmother   . Diabetes Paternal Grandfather   . Cancer Maternal Aunt        ovarian    SOCIAL HISTORY: Social History   Socioeconomic History  . Marital status: Married    Spouse name: Not on file  . Number of children: Not on file  . Years of education: Not on file  . Highest  education level: Not on file  Occupational History  . Not on file  Tobacco Use  . Smoking status: Never Smoker  . Smokeless tobacco: Never Used  Substance and Sexual Activity  . Alcohol use: Yes    Comment: occasionally  . Drug use: No  . Sexual activity: Yes    Birth control/protection: Pill  Other Topics Concern  . Not on file  Social History Narrative  . Not on file   Social Determinants of Health  Financial Resource Strain:   . Difficulty of Paying Living Expenses:   Food Insecurity:   . Worried About Charity fundraiser in the Last Year:   . Arboriculturist in the Last Year:   Transportation Needs:   . Film/video editor (Medical):   Marland Kitchen Lack of Transportation (Non-Medical):   Physical Activity:   . Days of Exercise per Week:   . Minutes of Exercise per Session:   Stress:   . Feeling of Stress :   Social Connections:   . Frequency of Communication with Friends and Family:   . Frequency of Social Gatherings with Friends and Family:   . Attends Religious Services:   . Active Member of Clubs or Organizations:   . Attends Archivist Meetings:   Marland Kitchen Marital Status:   Intimate Partner Violence:   . Fear of Current or Ex-Partner:   . Emotionally Abused:   Marland Kitchen Physically Abused:   . Sexually Abused:       PHYSICAL EXAM  Vitals:   09/15/19 1007  BP: 138/84  Pulse: (!) 53  Weight: 149 lb (67.6 kg)  Height: 5\' 6"  (1.676 m)   Body mass index is 24.05 kg/m.  Generalized: Well developed, in no acute distress  Cardiology: normal rate and rhythm, no murmur noted Respiratory: clear to auscultation bilaterally  Neurological examination  Mentation: Alert oriented to time, place, history taking. Follows all commands speech and language fluent Cranial nerve II-XII: Pupils were equal round reactive to light. Extraocular movements were full, visual field were full on confrontational test. Facial sensation and strength were normal. Uvula tongue midline. Head  turning and shoulder shrug  were normal and symmetric. Motor: The motor testing reveals 5 over 5 strength of all 4 extremities. Good symmetric motor tone is noted throughout.  Sensory: Sensory testing is intact to soft touch on all 4 extremities. No evidence of extinction is noted.  Coordination: Cerebellar testing reveals good finger-nose-finger and heel-to-shin bilaterally.  Gait and station: Gait is normal.   DIAGNOSTIC DATA (LABS, IMAGING, TESTING) - I reviewed patient records, labs, notes, testing and imaging myself where available.  No flowsheet data found.   Lab Results  Component Value Date   WBC 7.7 04/22/2019   HGB 13.8 04/22/2019   HCT 44.1 04/22/2019   MCV 96.3 04/22/2019   PLT 257 04/22/2019      Component Value Date/Time   NA 140 04/22/2019 1213   K 3.7 04/22/2019 1213   CL 112 (H) 04/22/2019 1213   CO2 19 (L) 04/22/2019 1213   GLUCOSE 86 04/22/2019 1213   BUN 20 04/22/2019 1213   CREATININE 1.16 (H) 04/22/2019 1213   CALCIUM 9.3 04/22/2019 1213   PROT 6.7 11/23/2018 2004   ALBUMIN 4.2 11/23/2018 2004   AST 16 11/23/2018 2004   ALT 17 11/23/2018 2004   ALKPHOS 49 11/23/2018 2004   BILITOT 0.9 11/23/2018 2004   GFRNONAA >60 04/22/2019 1213   GFRAA >60 04/22/2019 1213   No results found for: CHOL, HDL, LDLCALC, LDLDIRECT, TRIG, CHOLHDL No results found for: HGBA1C No results found for: VITAMINB12 No results found for: TSH     ASSESSMENT AND PLAN 40 y.o. year old female  has a past medical history of Asthma, Cancer ([place]), Common migraine with intractable migraine (05/11/2016), Hypertension, Migraines, PCOS (polycystic ovarian syndrome), and Seizures ([place]). here with     ICD-10-CM   1. IIH (idiopathic intracranial hypertension)  G93.2     Candis is doing well.  We will continue Diamox 750mg  BID as well as  topiramate 200mg  and gabapentin 200mg  daily. She will continue eletriptan as needed for abortive therapy. She was encouraged to schedule follow up with  opthalmology. She will continue healthy lifestyle habits. She was encouraged to stay well hydrated. She will follow up in 1 year, sooner if needed. She verbalizes understanding and agreement with this plan.    No orders of the defined types were placed in this encounter.    No orders of the defined types were placed in this encounter.     I spent 15 minutes with the patient. 50% of this time was spent counseling and educating patient on plan of care and medications.    [name], FNP-C 09/15/2019, 10:13 AM [facility] [address] [place], [place] [address] [phone]

## 2019-09-16 DIAGNOSIS — A609 Anogenital herpesviral infection, unspecified: Secondary | ICD-10-CM | POA: Diagnosis not present

## 2019-09-16 DIAGNOSIS — N760 Acute vaginitis: Secondary | ICD-10-CM | POA: Diagnosis not present

## 2019-09-16 NOTE — Progress Notes (Signed)
I have read the note, and I agree with the clinical assessment and plan.  Nyna Chilton K Kaio Kuhlman   

## 2019-09-27 DIAGNOSIS — R519 Headache, unspecified: Secondary | ICD-10-CM | POA: Diagnosis not present

## 2019-09-27 DIAGNOSIS — Z03818 Encounter for observation for suspected exposure to other biological agents ruled out: Secondary | ICD-10-CM | POA: Diagnosis not present

## 2019-09-30 ENCOUNTER — Telehealth: Payer: Self-pay | Admitting: Family Medicine

## 2019-09-30 NOTE — Telephone Encounter (Signed)
Pt called having headaches last 5-6 days. Taking migraine medication like suppose to and headaches not going away.

## 2019-10-01 ENCOUNTER — Telehealth: Payer: Self-pay

## 2019-10-01 MED ORDER — NURTEC 75 MG PO TBDP: 75.0000 mg | ORAL_TABLET | Freq: Every day | ORAL | 11 refills | Status: DC | PRN

## 2019-10-01 NOTE — Addendum Note (Signed)
Addended by: [name] L on: 10/01/2019 08:33 AM   Modules accepted: Orders

## 2019-10-01 NOTE — Telephone Encounter (Signed)
Let her know that I have called in Nurtec for her to take to abort migraine. 1 tablet daily as needed. Have her make sure to drink at least 60 ounces of water every day. She should call ophthalmology if she has any changes in vision that are not resolved once headache resolves. Complementary therapies advised. Call for worsening.

## 2019-10-01 NOTE — Telephone Encounter (Signed)
I called pt back.  Relayed that AL/NP called in medication called Nurtec to abort migraine  1 tablet prn,  Stay hydrated drinking 60oz water daily. Call eye doc after headache resolves.  Use complementary therapies to help.  Call us back if worsening.  (not here on Friday.  PA maybe needed for nurtec, but should be able to get first month but using savings card, go online [email or web address] show to pharmacy.  Pt verbalized understanding.

## 2019-10-01 NOTE — Telephone Encounter (Signed)
I have submitted the PA request for Nurtec 75mg  on CMM, Key: [id number].  Your information has been submitted to [place]. [employer] [place] will review the request and notify you of the determination decision directly, typically within 72 hours of receiving all information.  You will also receive your request decision electronically. To check for an update later, open this request again from your dashboard.  If [employer] has not responded within the specified timeframe or if you have any questions about your PA submission, contact Nikolski Dougherty directly at [phone].

## 2019-10-01 NOTE — Telephone Encounter (Signed)
Spoke to pt  relayed message from NP. Pt voiced understanding and states she will call us if symptoms worsen

## 2019-10-01 NOTE — Telephone Encounter (Signed)
I called pt and she states that she has had a headache for the last 6-7 days now.  Pressure R eye, blurry vision started yesterday.  She has been taking her topamax, gabapentin and diamox.  Has taken relpax for 3 days and not helped like normally does for a migraine.  She has had dizziness 2 days ago (comes and goes) no nausea.  She was in here 09-15-19, had some LE swelling, this she says has gone down.  (she is urinating a lot).  Please advise.  She has not seen eye doc since last seen.

## 2019-10-02 NOTE — Telephone Encounter (Signed)
Pt is aware of the 72 hour processing re: the PA for the Nurtec.  Pt is asking for a call to discuss what she should do while waiting, please call.

## 2019-10-02 NOTE — Telephone Encounter (Signed)
She can. We can try a steroid taper if she is unable to get Nurtec and headache is still bothersome. Benadryl with Relpax and naproxen may also be an options for abortive therapy.

## 2019-10-06 ENCOUNTER — Encounter: Payer: Self-pay | Admitting: *Deleted

## 2019-10-06 MED ORDER — METHYLPREDNISOLONE 4 MG PO TBPK: ORAL_TABLET | ORAL | 0 refills | Status: DC

## 2019-10-07 NOTE — Telephone Encounter (Signed)
Checking on CMM KEY [id number] was denied.

## 2019-10-09 ENCOUNTER — Telehealth: Payer: Self-pay | Admitting: *Deleted

## 2019-10-09 ENCOUNTER — Encounter: Payer: Self-pay | Admitting: *Deleted

## 2019-10-09 MED ORDER — ACETAZOLAMIDE 250 MG PO TABS: 1000.0000 mg | ORAL_TABLET | Freq: Two times a day (BID) | ORAL | 11 refills | Status: DC

## 2019-10-09 MED ORDER — NARATRIPTAN HCL 2.5 MG PO TABS: 2.5000 mg | ORAL_TABLET | ORAL | 0 refills | Status: DC | PRN

## 2019-10-09 NOTE — Telephone Encounter (Signed)
Called patient to discuss treatments per NP. She is allergic to ASA, Ibuprofen so cannot take Toradol. She stated she has taken Tylenol, Relpax but has not overused either one. She was given Tramadol 50 mg on 8/21, #12 but still has some leftover. She does not want to get rebound headache. She would like to try Amerge Rx. I advised will let NP know, and informed her our office has MD on call on weekends. Patient verbalized understanding, appreciation.

## 2019-10-09 NOTE — Addendum Note (Signed)
Addended by: [name] L on: 10/09/2019 01:31 PM   Modules accepted: Orders

## 2019-10-09 NOTE — Telephone Encounter (Signed)
I am a little concerned that this headache has gone on for so long. Please make sure she is not having any red flag warning, specifically BP> 150/90, vision loss, significant eye pain outside of usual migraine symptoms, vomiting or other respiratory symptoms. Make sure she is aware to call PCP for any concerns of COVID symptoms. I am going to have her increase Diamox to 1000mg  (4 tablets) twice daily and will call in Amerge 2.5mg  for acute therapy. She should continue all previous recommendations and complete steroid taper. If headache is not easing up in 24-48 hours or if it significantly worsens/has red flag warnings, she needs to seek medical attention immediately.

## 2019-10-09 NOTE — Telephone Encounter (Addendum)
Called patient and began to advise her of NP's message. She had her mom take her BP right away: BP 131/82. I then reviewed all instructions, advised she pick up 2 new Rx at pharmacy. I also advised I'll send NP's instructions to her in her my chart. Patient verbalized understanding, appreciation. My chart sent with NP's full message.

## 2019-10-10 DIAGNOSIS — D2262 Melanocytic nevi of left upper limb, including shoulder: Secondary | ICD-10-CM | POA: Diagnosis not present

## 2019-10-10 DIAGNOSIS — Z85828 Personal history of other malignant neoplasm of skin: Secondary | ICD-10-CM | POA: Diagnosis not present

## 2019-10-10 DIAGNOSIS — D2261 Melanocytic nevi of right upper limb, including shoulder: Secondary | ICD-10-CM | POA: Diagnosis not present

## 2019-10-10 DIAGNOSIS — L918 Other hypertrophic disorders of the skin: Secondary | ICD-10-CM | POA: Diagnosis not present

## 2019-11-27 DIAGNOSIS — H52223 Regular astigmatism, bilateral: Secondary | ICD-10-CM | POA: Diagnosis not present

## 2019-12-02 DIAGNOSIS — M5412 Radiculopathy, cervical region: Secondary | ICD-10-CM | POA: Diagnosis not present

## 2020-01-17 ENCOUNTER — Other Ambulatory Visit: Payer: Self-pay

## 2020-01-17 ENCOUNTER — Emergency Department (HOSPITAL_COMMUNITY): Payer: BC Managed Care – PPO

## 2020-01-17 ENCOUNTER — Emergency Department (HOSPITAL_COMMUNITY)
Admission: EM | Admit: 2020-01-17 | Discharge: 2020-01-18 | Disposition: A | Discharge status: Home / Self Care | Payer: BC Managed Care – PPO | Attending: Emergency Medicine | Admitting: Emergency Medicine

## 2020-01-17 DIAGNOSIS — I10 Essential (primary) hypertension: Secondary | ICD-10-CM | POA: Insufficient documentation

## 2020-01-17 DIAGNOSIS — N6452 Nipple discharge: Secondary | ICD-10-CM | POA: Insufficient documentation

## 2020-01-17 DIAGNOSIS — R079 Chest pain, unspecified: Secondary | ICD-10-CM | POA: Diagnosis not present

## 2020-01-17 DIAGNOSIS — K219 Gastro-esophageal reflux disease without esophagitis: Secondary | ICD-10-CM | POA: Insufficient documentation

## 2020-01-17 DIAGNOSIS — Z85830 Personal history of malignant neoplasm of bone: Secondary | ICD-10-CM | POA: Insufficient documentation

## 2020-01-17 DIAGNOSIS — J45909 Unspecified asthma, uncomplicated: Secondary | ICD-10-CM | POA: Insufficient documentation

## 2020-01-17 LAB — TROPONIN I (HIGH SENSITIVITY)
Troponin I (High Sensitivity): <2 ng/L (ref <18)
Troponin I (High Sensitivity): <2 ng/L (ref <18)

## 2020-01-17 LAB — BASIC METABOLIC PANEL
Anion gap: 6 (ref 5–15)
BUN: 14 mg/dL (ref 6–20)
CO2: 20 mmol/L — ABNORMAL LOW (ref 22–32)
Calcium: 9.2 mg/dL (ref 8.9–10.3)
Chloride: 115 mmol/L — ABNORMAL HIGH (ref 98–111)
Creatinine, Ser: 0.98 mg/dL (ref 0.44–1.00)
GFR, Estimated: >60 mL/min (ref >60)
Glucose, Bld: 95 mg/dL (ref 70–99)
Potassium: 3.7 mmol/L (ref 3.5–5.1)
Sodium: 141 mmol/L (ref 135–145)

## 2020-01-17 LAB — CBC
HCT: 39.4 % (ref 36.0–46.0)
Hemoglobin: 12.6 g/dL (ref 12.0–15.0)
MCH: 30.7 pg (ref 26.0–34.0)
MCHC: 32 g/dL (ref 30.0–36.0)
MCV: 95.9 fL (ref 80.0–100.0)
Platelets: 232 10*3/uL (ref 150–400)
RBC: 4.11 MIL/uL (ref 3.87–5.11)
RDW: 13.5 % (ref 11.5–15.5)
WBC: 6 10*3/uL (ref 4.0–10.5)
nRBC: 0 % (ref 0.0–0.2)

## 2020-01-17 LAB — I-STAT BETA HCG BLOOD, ED (MC, WL, AP ONLY): I-stat hCG, quantitative: <5 m[IU]/mL (ref <5)

## 2020-01-17 NOTE — ED Notes (Signed)
Pt not found in waiting room.

## 2020-01-17 NOTE — ED Triage Notes (Signed)
Pt reports central chest pain x 4 days. Reports feeling similar to heartburn. Using asthma inhalers without improvement. Also woke up this morning with clear discharge from L breast.

## 2020-01-18 ENCOUNTER — Other Ambulatory Visit: Payer: Self-pay

## 2020-01-18 ENCOUNTER — Encounter (HOSPITAL_COMMUNITY): Payer: Self-pay | Admitting: *Deleted

## 2020-01-18 ENCOUNTER — Emergency Department (HOSPITAL_COMMUNITY)
Admission: EM | Admit: 2020-01-18 | Discharge: 2020-01-18 | Disposition: A | Discharge status: Home / Self Care | Payer: BC Managed Care – PPO | Source: Home / Self Care | Attending: Emergency Medicine | Admitting: Emergency Medicine

## 2020-01-18 DIAGNOSIS — I10 Essential (primary) hypertension: Secondary | ICD-10-CM | POA: Insufficient documentation

## 2020-01-18 DIAGNOSIS — Z85830 Personal history of malignant neoplasm of bone: Secondary | ICD-10-CM | POA: Insufficient documentation

## 2020-01-18 DIAGNOSIS — J45909 Unspecified asthma, uncomplicated: Secondary | ICD-10-CM | POA: Insufficient documentation

## 2020-01-18 DIAGNOSIS — K219 Gastro-esophageal reflux disease without esophagitis: Secondary | ICD-10-CM

## 2020-01-18 DIAGNOSIS — Z79899 Other long term (current) drug therapy: Secondary | ICD-10-CM | POA: Insufficient documentation

## 2020-01-18 MED ORDER — OMEPRAZOLE 20 MG PO CPDR: 20.0000 mg | DELAYED_RELEASE_CAPSULE | Freq: Two times a day (BID) | ORAL | 0 refills | Status: DC

## 2020-01-18 MED ORDER — ALUM & MAG HYDROXIDE-SIMETH 200-200-20 MG/5ML PO SUSP
30.0000 mL | Freq: Once | ORAL | Status: AC
  Administered 2020-01-18: 30 mL via ORAL
  Filled 2020-01-18: qty 30

## 2020-01-18 MED ORDER — LIDOCAINE VISCOUS HCL 2 % MT SOLN
15.0000 mL | Freq: Once | OROMUCOSAL | Status: AC
  Administered 2020-01-18: 15 mL via ORAL
  Filled 2020-01-18: qty 15

## 2020-01-18 NOTE — ED Triage Notes (Signed)
This  Pt has been in the ed since 1823 ahe was checked in had labs and xrays  She fell asleep in the waiting room and woke up around 0400am asking about the wait  She was told that her anme was called and when she did not answer her chart was taken out of the sympton  She still wants to be seen soblmp iud

## 2020-01-18 NOTE — Discharge Instructions (Addendum)
Take the medications as prescribed.  Follow-up with your primary care doctor next week to be rechecked if

## 2020-01-18 NOTE — ED Notes (Signed)
Pt still in waiting room

## 2020-01-18 NOTE — ED Notes (Addendum)
Pt was called x3, no answer & pt was taken otf. Pt was then found in waiting room a few hours later. due to time pt had been discharged pt had to be reregistered. New armband placed on pt.

## 2020-01-18 NOTE — ED Provider Notes (Signed)
[facility] Provider Note   CSN: [id number] Arrival date & time: 01/18/20  0550     History Chief Complaint  Patient presents with  . Chest Pain  . asthma    [name] is a 39 y.o. female.  HPI  HPI: A 39 year old patient with a history of hypertension presents for evaluation of chest pain. Initial onset of pain was more than 6 hours ago. The patient's chest pain is described as heaviness/pressure/tightness and is not worse with exertion. The patient's chest pain is not middle- or left-sided, is not well-localized, is not sharp and does not radiate to the arms/jaw/neck. The patient does not complain of nausea and denies diaphoresis. The patient has no history of stroke, has no history of peripheral artery disease, has not smoked in the past 90 days, denies any history of treated diabetes, has no relevant family history of coronary artery disease (first degree relative at less than age 79), has no history of hypercholesterolemia and does not have an elevated BMI (>=30).  Patient states she noticed the symptoms yesterday.  She tried taking an antacid pill she thinks might have been Zantac.  It did not help.  When she was having the discomfort did feel similar to when she had acid reflux in the past except she felt like something was coming up in her chest.  When she started having some tightness discomfort later she thought maybe it could be her asthma so she took her inhaler but that did not help.  Patient has not had any leg swelling.  No fevers or chills.  Patient also noticed some clear nipple jaw discharge from her left breast.  She had not had that before.  She denies any breast tenderness.  No swelling that she noticed. Past Medical History:  Diagnosis Date  . Asthma   . Cancer ([place])    cancer in my leg  . Common migraine with intractable migraine 05/11/2016  . Hypertension   . Migraines   . PCOS (polycystic ovarian syndrome)   . Seizures  ([place])    c/b anesthesia    Patient Active Problem List   Diagnosis Date Noted  . Common migraine with intractable migraine 05/11/2016  . Axillary lump 06/25/2012    Past Surgical History:  Procedure Laterality Date  . CESAREAN SECTION     one previous  . HERNIA REPAIR    . NASAL RECONSTRUCTION       OB History    Gravida  1   Para  1   Term  1   Preterm      AB      Living  1     SAB      IAB      Ectopic      Multiple      Live Births              Family History  Problem Relation Age of Onset  . Breast cancer Maternal Aunt   . Diabetes Maternal Grandmother   . Diabetes Paternal Grandfather   . Cancer Maternal Aunt        ovarian    Social History   Tobacco Use  . Smoking status: Never Smoker  . Smokeless tobacco: Never Used  Substance Use Topics  . Alcohol use: Yes    Comment: occasionally  . Drug use: No    Home Medications Prior to Admission medications   Medication Sig Start Date End Date Taking? Authorizing Provider  acetaminophen (TYLENOL) 650 MG CR tablet Take 650-1,300 mg by mouth 2 (two) times daily as needed for pain.   Yes [provider]  acetaZOLAMIDE (DIAMOX) 250 MG tablet Take 4 tablets (1,000 mg total) by mouth 2 (two) times daily. 10/09/19  Yes Lomax, Amy, NP  cetirizine (ZYRTEC) 10 MG tablet Take 10 mg by mouth daily as needed for allergies or rhinitis.   Yes [provider]  diphenhydrAMINE (BENADRYL) 12.5 MG/5ML liquid Take 25 mg by mouth at bedtime as needed for sleep.   Yes [provider]  eletriptan (RELPAX) 40 MG tablet Take 1 tab onset of migraine and repeat in 2 hours if needed. (max 2/24hr) 03/17/19  Yes Lomax, Amy, NP  gabapentin (NEURONTIN) 100 MG capsule Take 2 capsules (200 mg total) by mouth at bedtime. Patient taking differently: Take 200 mg by mouth daily. 08/19/19  Yes Lomax, Amy, NP  levonorgestrel (MIRENA) 20 MCG/24HR IUD 1 each by Intrauterine route once.   Yes [provider]  loratadine (CLARITIN) 10 MG tablet Take 10 mg by mouth daily as needed for allergies or rhinitis.   Yes [provider]  naratriptan (AMERGE) 2.5 MG tablet Take 1 tablet (2.5 mg total) by mouth as needed for migraine. Take one (1) tablet at onset of headache; if returns or does not resolve, may repeat after 4 hours; do not exceed five (5) mg in 24 hours. 10/09/19  Yes Lomax, Amy, NP  ondansetron (ZOFRAN) 4 MG tablet Take 1 tablet (4 mg total) by mouth every 8 (eight) hours as needed for nausea or vomiting. 12/03/18  Yes Lomax, Amy, NP  topiramate (TOPAMAX) 200 MG tablet Take 1 tablet (200 mg total) by mouth at bedtime. Patient taking differently: Take 200 mg by mouth daily. 08/07/19  Yes Lomax, Amy, NP  triamcinolone cream (KENALOG) 0.1 % Apply 1 application topically in the morning and at bedtime. 04/17/19  Yes [provider]  valACYclovir (VALTREX) 500 MG tablet Take 500 mg by mouth 2 (two) times daily as needed (as needed for lesions).   Yes [provider]  omeprazole (PRILOSEC) 20 MG capsule Take 1 capsule (20 mg total) by mouth 2 (two) times daily before a meal. 01/18/20   Dorie Rank, MD  Rimegepant Sulfate (NURTEC) 75 MG TBDP Take 75 mg by mouth daily as needed (take for abortive therapy of migraine, no more than 1 tablet in 24 hours or 10 per month). Patient not taking: Reported on 01/18/2020 10/01/19   [name], NP    Allergies    Amoxicillin, Aspirin, Ibuprofen, Adhesive [tape], and Venlafaxine hcl  Review of Systems   Review of Systems  All other systems reviewed and are negative.   Physical Exam Updated Vital Signs BP 133/88 (BP Location: Left Arm)   Pulse (!) 51   Temp 98.4 F (36.9 C) (Oral)   Resp 14   Ht 1.676 m (5\' 6" )   Wt 67.6 kg   SpO2 100%   BMI 24.05 kg/m   Physical Exam Vitals and nursing note reviewed.  Constitutional:      General: She is not in acute distress.    Appearance: She is well-developed and  well-nourished.  HENT:     Head: Normocephalic and atraumatic.     Right Ear: External ear normal.     Left Ear: External ear normal.  Eyes:     General: No scleral icterus.       Right eye: No discharge.  Left eye: No discharge.     Conjunctiva/sclera: Conjunctivae normal.  Neck:     Trachea: No tracheal deviation.  Cardiovascular:     Rate and Rhythm: Normal rate and regular rhythm.     Pulses: Intact distal pulses.  Pulmonary:     Effort: Pulmonary effort is normal. No respiratory distress.     Breath sounds: Normal breath sounds. No stridor. No wheezing or rales.  Chest:     Comments: Left breast without erythema or tenderness, no nodule or mass appreciated (exam with RN Moon at the bedside) Abdominal:     General: Bowel sounds are normal. There is no distension.     Palpations: Abdomen is soft.     Tenderness: There is no abdominal tenderness. There is no guarding or rebound.  Musculoskeletal:        General: No tenderness or edema.     Cervical back: Neck supple.  Skin:    General: Skin is warm and dry.     Findings: No rash.  Neurological:     Mental Status: She is alert.     Cranial Nerves: No cranial nerve deficit (no facial droop, extraocular movements intact, no slurred speech).     Sensory: No sensory deficit.     Motor: No abnormal muscle tone or seizure activity.     Coordination: Coordination normal.     Deep Tendon Reflexes: Strength normal.  Psychiatric:        Mood and Affect: Mood and affect normal.     ED Results / Procedures / Treatments   Labs (all labs ordered are listed, but only abnormal results are displayed) Labs Reviewed - No data to display  EKG None  Radiology DG Chest 2 View  Result Date: 01/17/2020 CLINICAL DATA:  Chest pain. EXAM: CHEST - 2 VIEW COMPARISON:  Chest radiograph 04/22/2019 FINDINGS: The cardiomediastinal contours are within normal limits. The lungs are clear. No pneumothorax or pleural effusion. Scoliotic  curvature of the thoracolumbar spine. No acute osseous abnormality. IMPRESSION: No acute cardiopulmonary process. Electronically Signed   By: Audie Pinto M.D.   On: 01/17/2020 18:47    Procedures Procedures (including critical care time)  Medications Ordered in ED Medications  alum & mag hydroxide-simeth (MAALOX/MYLANTA) 200-200-20 MG/5ML suspension 30 mL (30 mLs Oral Given 01/18/20 0741)    And  lidocaine (XYLOCAINE) 2 % viscous mouth solution 15 mL (15 mLs Oral Given 01/18/20 0741)    ED Course  I have reviewed the triage vital signs and the nursing notes.  Pertinent labs & imaging results that were available during my care of the patient were reviewed by me and considered in my medical decision making (see chart for details).    MDM Rules/Calculators/A&P HEAR Score: 1                       Patient checked into the ED yesterday.  She was waiting the waiting room for prolonged period of time.  Apparently she was discharged and checked in and so I have reviewed all the laboratory tests and vital signs from yesterday.  Was CBC and metabolic panel unremarkable.  Troponin is normal.  Chest x-ray without any acute abnormality.  Patient's chest pain atypical for acute coronary etiology.  Low risk heart score.  Serial troponins normal.  Doubt ACS.  No PE risk factors.  PERC negative.  Patient complained of some nipple discharge yesterday.  She does not have any discharge currently.  There is no signs  of infection.  I do not appreciate any mass or abnormality.  Recommended outpatient follow-up with her primary care doctor for this symptoms return.  Doubt that is related to her pain.  Suspect patient's pain in her chest is related to acid reflux.  I will give her a GI cocktail.  Discharge home with a prescription for antacids.  Outpatient follow-up with PCP. Final Clinical Impression(s) / ED Diagnoses Final diagnoses:  Gastroesophageal reflux disease, unspecified whether esophagitis  present    Rx / DC Orders ED Discharge Orders         Ordered    omeprazole (PRILOSEC) 20 MG capsule  2 times daily before meals        01/18/20 0742           Dorie Rank, MD 01/18/20 [phone]

## 2020-01-23 DIAGNOSIS — R0789 Other chest pain: Secondary | ICD-10-CM | POA: Diagnosis not present

## 2020-03-17 ENCOUNTER — Other Ambulatory Visit: Payer: Self-pay | Admitting: *Deleted

## 2020-03-17 ENCOUNTER — Encounter: Payer: Self-pay | Admitting: Family Medicine

## 2020-03-17 MED ORDER — TOPIRAMATE 200 MG PO TABS: 200.0000 mg | ORAL_TABLET | Freq: Every day | ORAL | 1 refills | Status: DC

## 2020-04-16 ENCOUNTER — Other Ambulatory Visit: Payer: Self-pay | Admitting: Internal Medicine

## 2020-04-16 ENCOUNTER — Ambulatory Visit
Admission: RE | Admit: 2020-04-16 | Discharge: 2020-04-16 | Disposition: A | Discharge status: Home / Self Care | Payer: BC Managed Care – PPO | Source: Ambulatory Visit | Attending: Internal Medicine | Admitting: Internal Medicine

## 2020-04-16 DIAGNOSIS — Z0000 Encounter for general adult medical examination without abnormal findings: Secondary | ICD-10-CM

## 2020-04-16 DIAGNOSIS — R109 Unspecified abdominal pain: Secondary | ICD-10-CM

## 2020-04-16 MED ORDER — IOPAMIDOL (ISOVUE-300) INJECTION 61%
100.0000 mL | Freq: Once | INTRAVENOUS | Status: AC | PRN
  Administered 2020-04-16: 100 mL via INTRAVENOUS

## 2020-07-14 ENCOUNTER — Telehealth: Payer: Self-pay | Admitting: *Deleted

## 2020-07-14 NOTE — Telephone Encounter (Signed)
Nurtec PA, key: [id number], [id number]. tried/failed: sumatriptan, rizatriptan, eletriptan. Allergic- NSAIDS, venlafaxine. Your information has been sent to [facility].

## 2020-07-15 ENCOUNTER — Encounter: Payer: Self-pay | Admitting: *Deleted

## 2020-07-15 NOTE — Telephone Encounter (Signed)
Called [facility] to discuss denial of Nurtec. Spoke with Sam, and advised her patient is not on another CGRP; that was an error.  Started new PA, pended to pharmacy for review.   Ref # pa- [id number]. Decision in 48-96 hours, decision by fax. Sent my chart to update patient.

## 2020-07-15 NOTE — Telephone Encounter (Signed)
Nurtec denied: The denial was based on our criteria for Nurtec Tab 75mg  Odt. Per your health plan's criteria, this drug is covered if you meet the following: You are not taking this drug with another oral calcitonin gene-related peptide (CGRP) inhibitor. The information provided does not show that you meet the criteria listed above. This is incorrect, will call [facility] and resubmit PA.

## 2020-07-28 NOTE — Telephone Encounter (Signed)
Called [facility] to check status of nurtec PA. Chantel stated it is  Approved x 3 months until 10/15/2020. Faxed approval to pharmacy.

## 2020-09-09 NOTE — Patient Instructions (Addendum)
Below is our plan:  We will continue acetazolamide '1000mg'$  daily, topiramate '200mg'$  daily and gabapentin '200mg'$  at bedtime. Continue Nurtec for abortive therapy.   Please make sure you are staying well hydrated. I recommend 50-60 ounces daily. Well balanced diet and regular exercise encouraged. Consistent sleep schedule with 6-8 hours recommended.   Please continue follow up with care team as directed.   Follow up with me in 1 year  You may receive a survey regarding today's visit. I encourage you to leave honest feed back as I do use this information to improve patient care. Thank you for seeing me today!

## 2020-09-09 NOTE — Progress Notes (Signed)
PATIENT: [name] DOB: 10/16/1980  REASON FOR VISIT: follow up HISTORY FROM: patient  Chief Complaint  Patient presents with   Follow-up    Rm 12. Pt alone, following up today. States overall no concerns      HISTORY OF PRESENT ILLNESS: 09/13/20 ALL:  [name] returns for annual follow up for IIH. She continues acetazolamide '1000mg'$  daily (couldn't remember BID dosing), topiramate '200mg'$  daily and gabapentin '200mg'$  at bedtime. Nurtec used for abortive therapy. She feels this works better than naratriptan. She was doing very well with rare headaches but reports being out of gabapentin for about 6 months (didn't request refills) and off topiramate and acetazolamide for the past two weeks (pharmacy back order). When taking current regimen as prescribed she feels headaches are well managed. No vision changes. Last eye exam about a year ago.   09/15/2019 ALL:  [name] is a 40 y.o. female here today for follow up of IIH. She continues Diamox '750mg'$  BID, topiramate '200mg'$  and gabapentin '200mg'$  at bedtime. She uses eletriptan for abortive therapy. She denies any concerns of headaches. She may have a mild headache from time to time. She is doing well. She reports from time to time, she will have twitching of her eye but it resolves spontaneously. She stays well hydrated. She was last seen by ophthalmology in 03/2019. She does not have scheduled follow up.   HISTORY: (copied from my note on 03/17/2019)  [name] is a 40 y.o. female here today for follow up of IIH. She continues Diamox '750mg'$  twice daily, topiramate '200mg'$  and gabapentin '200mg'$  at bedtime. She reports that she has done well until last week. She started having a mild headache about 7 days ago. Headache was centered at the top of her head. Pain was dull aching pain. No light or sound sensitivity but she did have intermittent nausea. Vision was blurry when headache worsened but no vision loss. She reports that headache progressed for  3-4 days. She took one dose of eletriptan on Friday and two doses on Saturday. Sunday the headache was improved. Today headache is better. Blurry vision is significantly improved. She does report allergy symptoms as well. She has not taken allergy medication. She is not taking Excedrin. She is eating well and drinking plenty of water.    HISTORY: (copied from my note on 01/16/2019)   [name] is a 40 y.o. female here today for follow up of IIH. She continues Diamox '750mg'$  twice daily, topiramate '200mg'$  and gabapentin '200mg'$  at bedtime daily. She reports that headaches did improve with increased dose of Diamox. About three days ago, she felt a headache starting. She has right sided pressure, worse behind right eye. She was seen yesterday by ER for acute intractable headache treated with metoclopramide and dexamethasone '10mg'$ . She has noted some improvement today but headache continues. She did forget to tell UC provider yesterday that for a period of time, she felt as if she could only see black in the right outer vision fields. Vision is normal today with exception of mild blurriness with significant pain. She has felt nauseated as well and noticed light sensitivity driving to our appt today. She has not taken any OTC medications or Relpax as she was unsure if this would help. She is usually cautious about taking OTC medications. She uses Mirena for severe dysmenorrhea. We have discussed possible role of Mirena in worsening  IIH, however, data is conflicting. She is also followed very closely by Dr Nelda Marseille, gynecology. Patient  feels benefit of controlled menstrual cycles outweighs risks of worsening IIH at this time.    HISTORY: (copied from my note on 12/03/2018)   [name] is a 40 y.o. female here today for follow up for headaches with new diagnosis of IIH. She has been seen several times in the ER for worsening headaches since being seen by me in 06/2018. At that time, she reported that headaches were  doing well on topiramate and gabapentin. Relpax worked well for abortive therapy. She started having worsening of central headaches with pressure behind her eyes about 2-3 weeks ago. She reports that her headache was persistent and unresponsive to therapy. Pressure behind right eye with peripheral vision loss. Last week, LP showed opening pressure of 30. Closing pressure of 12 and headache improved. She was started on Diamox '500mg'$  twice daily and referred back to [place]. She has tolerated Diamox without adverse effects over the past 10 days. She has continued topiramate '200mg'$  and gabapentin '100mg'$  at bedtime. MRI was normal. She has noticed more trouble with her memory. She feels that her words are not coming to her. She has ringing in her ears. She is nauseated at times. She has not had an eye exam. She has Mirena for birth control placed about a year ago. Twin sister was diagnosed with IIH about 10 years ago.      HISTORY: (copied from my note on 07/04/2018)   [name] is a 40 y.o. female for follow up of migraines. She is taking topirimate '200mg'$  and gabapentin '100mg'$  at bedtime. She uses Relpax for abortive therapy. She does report sleepiness with Relpax. She has had about 3 migraines in the past year. She has 3-4 headaches per month. She feels that she is doing really well.      HISTORY (copied from [facility] note on 10/29/2017   [name] is a 40 year old female with a history of migraine headaches.  She returns today for follow-up.  She states overall her migraine headaches are better.  She has not had a migraine in the last month.  She states that she is not headaches at least 3 times a week.  She typically can take Tylenol or Excedrin tension.  She reports that she does not use Relpax as much because she needs to lay down after she takes it.  She states that she works different shifts at her job which also may be affecting her sleep.     HISTORY 04/19/17 [name]  is a 41 year old  female with a history of migraine.  She is currently taking 200 mg at bedtime.  She uses Relpax to treat her acute migraines.  She reports that she has had a ongoing headache for the last 3 weeks.  She reports that her pain fluctuates from a 4 out of 10 to a 7 out of 10.  She reports that the headache has been constant.  She has used Tylenol as well as Relpax.  She reports that these medications has decreased the severity but essentially did not resolve the headache.  She reports the headache usually starts in the center of the head and radiates down to the forehead.  She reports with severe headaches she will have photophobia.  She reports occasionally she will have halos in her vision.  She confirmed that she is also had nausea and vomiting.  She has been using Zofran.  In the past she has had a prednisone Dosepak for that it works well for her headache.  She  returns today for evaluation.   REVIEW OF SYSTEMS: Out of a complete 14 system review of symptoms, the patient complains only of the following symptoms, headaches and all other reviewed systems are negative.  ALLERGIES: Allergies  Allergen Reactions   Amoxicillin Hives, Nausea And Vomiting and Nausea Only    Has patient had a PCN reaction causing immediate rash, facial/tongue/throat swelling, SOB or lightheadedness with hypotension: Yes Has patient had a PCN reaction causing severe rash involving mucus membranes or skin necrosis: No Has patient had a PCN reaction that required hospitalization: No Has patient had a PCN reaction occurring within the last 10 years: No If all of the above answers are "NO", then may proceed with Cephalosporin use.    Aspirin Hives, Nausea And Vomiting and Nausea Only   Ibuprofen Hives, Nausea And Vomiting and Nausea Only   Adhesive [Tape] Rash and Other (See Comments)    CERTAIN BANDAGES BREAK OUT THE SKIN !!   Venlafaxine Hcl Palpitations    HOME MEDICATIONS: Outpatient Medications Prior to Visit   Medication Sig Dispense Refill   acetaminophen (TYLENOL) 650 MG CR tablet Take 650-1,300 mg by mouth 2 (two) times daily as needed for pain.     cetirizine (ZYRTEC) 10 MG tablet Take 10 mg by mouth daily as needed for allergies or rhinitis.     eletriptan (RELPAX) 40 MG tablet Take 1 tab onset of migraine and repeat in 2 hours if needed. (max 2/24hr) 10 tablet 11   levonorgestrel (MIRENA) 20 MCG/24HR IUD 1 each by Intrauterine route once.     loratadine (CLARITIN) 10 MG tablet Take 10 mg by mouth daily as needed for allergies or rhinitis.     ondansetron (ZOFRAN) 4 MG tablet Take 1 tablet (4 mg total) by mouth every 8 (eight) hours as needed for nausea or vomiting. 30 tablet 3   Rimegepant Sulfate (NURTEC) 75 MG TBDP Take 75 mg by mouth daily as needed (take for abortive therapy of migraine, no more than 1 tablet in 24 hours or 10 per month). (Patient not taking: Reported on 01/18/2020) 8 tablet 11   valACYclovir (VALTREX) 500 MG tablet Take 500 mg by mouth 2 (two) times daily as needed (as needed for lesions).     acetaZOLAMIDE (DIAMOX) 250 MG tablet Take 4 tablets (1,000 mg total) by mouth 2 (two) times daily. 240 tablet 11   diphenhydrAMINE (BENADRYL) 12.5 MG/5ML liquid Take 25 mg by mouth at bedtime as needed for sleep.     gabapentin (NEURONTIN) 100 MG capsule Take 2 capsules (200 mg total) by mouth at bedtime. (Patient taking differently: Take 200 mg by mouth daily.) 60 capsule 5   naratriptan (AMERGE) 2.5 MG tablet Take 1 tablet (2.5 mg total) by mouth as needed for migraine. Take one (1) tablet at onset of headache; if returns or does not resolve, may repeat after 4 hours; do not exceed five (5) mg in 24 hours. 10 tablet 0   omeprazole (PRILOSEC) 20 MG capsule Take 1 capsule (20 mg total) by mouth 2 (two) times daily before a meal. 20 capsule 0   topiramate (TOPAMAX) 200 MG tablet Take 1 tablet (200 mg total) by mouth at bedtime. 90 tablet 1   triamcinolone cream (KENALOG) 0.1 % Apply 1  application topically in the morning and at bedtime.     No facility-administered medications prior to visit.    PAST MEDICAL HISTORY: Past Medical History:  Diagnosis Date   Asthma    Cancer ([place])  cancer in my leg   Common migraine with intractable migraine 05/11/2016   Hypertension    Migraines    PCOS (polycystic ovarian syndrome)    Seizures ([facility])    c/b anesthesia    PAST SURGICAL HISTORY: Past Surgical History:  Procedure Laterality Date   CESAREAN SECTION     one previous   HERNIA REPAIR     NASAL RECONSTRUCTION      FAMILY HISTORY: Family History  Problem Relation Age of Onset   Breast cancer Maternal Aunt    Diabetes Maternal Grandmother    Diabetes Paternal Grandfather    Cancer Maternal Aunt        ovarian    SOCIAL HISTORY: Social History   Socioeconomic History   Marital status: Married    Spouse name: Not on file   Number of children: Not on file   Years of education: Not on file   Highest education level: Not on file  Occupational History   Not on file  Tobacco Use   Smoking status: Never   Smokeless tobacco: Never  Substance and Sexual Activity   Alcohol use: Yes    Comment: occasionally   Drug use: No   Sexual activity: Yes    Birth control/protection: Pill  Other Topics Concern   Not on file  Social History Narrative   Not on file   Social Determinants of Health   Financial Resource Strain: Not on file  Food Insecurity: Not on file  Transportation Needs: Not on file  Physical Activity: Not on file  Stress: Not on file  Social Connections: Not on file  Intimate Partner Violence: Not on file      PHYSICAL EXAM  Vitals:   09/13/20 0835  BP: 119/72  Pulse: 65  Weight: 161 lb (73 kg)  Height: '5\' 6"'$  (1.676 m)    Body mass index is 25.99 kg/m.  Generalized: Well developed, in no acute distress  Cardiology: normal rate and rhythm, no murmur noted Respiratory: clear to auscultation bilaterally  Neurological  examination  Mentation: Alert oriented to time, place, history taking. Follows all commands speech and language fluent Cranial nerve II-XII: Pupils were equal round reactive to light. Extraocular movements were full, visual field were full on confrontational test. Facial sensation and strength were normal. Uvula tongue midline. Head turning and shoulder shrug  were normal and symmetric. Motor: The motor testing reveals 5 over 5 strength of all 4 extremities. Good symmetric motor tone is noted throughout.  Sensory: Sensory testing is intact to soft touch on all 4 extremities. No evidence of extinction is noted.  Coordination: Cerebellar testing reveals good finger-nose-finger and heel-to-shin bilaterally.  Gait and station: Gait is normal.   DIAGNOSTIC DATA (LABS, IMAGING, TESTING) - I reviewed patient records, labs, notes, testing and imaging myself where available.  No flowsheet data found.   Lab Results  Component Value Date   WBC 6.0 01/17/2020   HGB 12.6 01/17/2020   HCT 39.4 01/17/2020   MCV 95.9 01/17/2020   PLT 232 01/17/2020      Component Value Date/Time   NA 141 01/17/2020 1842   K 3.7 01/17/2020 1842   CL 115 (H) 01/17/2020 1842   CO2 20 (L) 01/17/2020 1842   GLUCOSE 95 01/17/2020 1842   BUN 14 01/17/2020 1842   CREATININE 0.98 01/17/2020 1842   CALCIUM 9.2 01/17/2020 1842   PROT 6.7 11/23/2018 2004   ALBUMIN 4.2 11/23/2018 2004   AST 16 11/23/2018 2004   ALT 17  11/23/2018 2004   ALKPHOS 49 11/23/2018 2004   BILITOT 0.9 11/23/2018 2004   GFRNONAA >60 01/17/2020 1842   GFRAA >60 04/22/2019 1213   No results found for: CHOL, HDL, LDLCALC, LDLDIRECT, TRIG, CHOLHDL No results found for: HGBA1C No results found for: VITAMINB12 No results found for: TSH     ASSESSMENT AND PLAN 40 y.o. year old female  has a past medical history of Asthma, Cancer ([place]), Common migraine with intractable migraine (05/11/2016), Hypertension, Migraines, PCOS (polycystic ovarian  syndrome), and Seizures ([place]). here with     ICD-10-CM   1. IIH (idiopathic intracranial hypertension)  G93.2     2. Migraine without aura and without status migrainosus, not intractable  G43.009        Kaylynne is doing well when taking medications consistently. We will continue acetazolimide '1000mg'$ , topiramate '200mg'$  and gabapentin '200mg'$  daily. She will continue Nurtec as needed for abortive therapy. She was encouraged to schedule follow up with opthalmology. She will continue healthy lifestyle habits. She was encouraged to stay well hydrated. She will follow up in 1 year, sooner if needed. She verbalizes understanding and agreement with this plan.    No orders of the defined types were placed in this encounter.    Meds ordered this encounter  Medications   acetaZOLAMIDE (DIAMOX) 250 MG tablet    Sig: Take 4 tablets (1,000 mg total) by mouth daily.    Dispense:  360 tablet    Refill:  3    Order Specific Question:   Supervising Provider    Answer:   Melvenia Beam [id number]   gabapentin (NEURONTIN) 100 MG capsule    Sig: Take 2 capsules (200 mg total) by mouth at bedtime.    Dispense:  180 capsule    Refill:  3    Order Specific Question:   Supervising Provider    Answer:   Melvenia Beam [id number]   topiramate (TOPAMAX) 200 MG tablet    Sig: Take 1 tablet (200 mg total) by mouth at bedtime.    Dispense:  90 tablet    Refill:  3    Order Specific Question:   Supervising Provider    Answer:   Bess Harvest, FNP-C 09/13/2020, 10:27 AM [facility] [address] [place],  [address] [phone]

## 2020-09-13 ENCOUNTER — Ambulatory Visit: Payer: No Typology Code available for payment source | Admitting: Family Medicine

## 2020-09-13 ENCOUNTER — Encounter: Payer: Self-pay | Admitting: Family Medicine

## 2020-09-13 VITALS — BP 119/72 | HR 65 | Ht 66.0 in | Wt 161.0 lb

## 2020-09-13 DIAGNOSIS — G932 Benign intracranial hypertension: Secondary | ICD-10-CM | POA: Diagnosis not present

## 2020-09-13 DIAGNOSIS — G43009 Migraine without aura, not intractable, without status migrainosus: Secondary | ICD-10-CM | POA: Diagnosis not present

## 2020-09-13 MED ORDER — GABAPENTIN 100 MG PO CAPS: 200.0000 mg | ORAL_CAPSULE | Freq: Every day | ORAL | 3 refills | Status: DC

## 2020-09-13 MED ORDER — TOPIRAMATE 200 MG PO TABS: 200.0000 mg | ORAL_TABLET | Freq: Every day | ORAL | 3 refills | Status: DC

## 2020-09-13 MED ORDER — ACETAZOLAMIDE 250 MG PO TABS: 1000.0000 mg | ORAL_TABLET | Freq: Every day | ORAL | 3 refills | Status: DC

## 2020-09-13 NOTE — Progress Notes (Signed)
I have read the note, and I agree with the clinical assessment and plan.  Nachman Sundt K Karna Abed   

## 2020-12-13 ENCOUNTER — Encounter: Payer: Self-pay | Admitting: Family Medicine

## 2020-12-14 ENCOUNTER — Other Ambulatory Visit: Payer: Self-pay | Admitting: Neurology

## 2020-12-14 MED ORDER — NURTEC 75 MG PO TBDP: 75.0000 mg | ORAL_TABLET | Freq: Every day | ORAL | 11 refills | Status: DC | PRN

## 2020-12-15 ENCOUNTER — Telehealth: Payer: Self-pay

## 2020-12-15 NOTE — Telephone Encounter (Signed)
Renewal PA for nurtec has been sent via cover my meds. (Key: [id number])  Your information has been sent to [employer].

## 2020-12-15 NOTE — Telephone Encounter (Signed)
(  Key: [id number])  This request has received a Favorable outcome.  Please note any additional information provided by [employer] at the bottom of your screen. r: [id number]. NURTEC TAB 75MG  ODT is approved through 12/15/2021.

## 2021-01-02 ENCOUNTER — Other Ambulatory Visit: Payer: Self-pay

## 2021-01-02 ENCOUNTER — Encounter (HOSPITAL_COMMUNITY): Payer: Self-pay | Admitting: Emergency Medicine

## 2021-01-02 ENCOUNTER — Emergency Department (HOSPITAL_COMMUNITY)
Admission: EM | Admit: 2021-01-02 | Discharge: 2021-01-02 | Disposition: A | Discharge status: Home / Self Care | Payer: No Typology Code available for payment source | Attending: Emergency Medicine | Admitting: Emergency Medicine

## 2021-01-02 ENCOUNTER — Emergency Department (HOSPITAL_COMMUNITY): Payer: No Typology Code available for payment source

## 2021-01-02 DIAGNOSIS — I10 Essential (primary) hypertension: Secondary | ICD-10-CM | POA: Insufficient documentation

## 2021-01-02 DIAGNOSIS — J4521 Mild intermittent asthma with (acute) exacerbation: Secondary | ICD-10-CM | POA: Diagnosis not present

## 2021-01-02 DIAGNOSIS — Z85820 Personal history of malignant melanoma of skin: Secondary | ICD-10-CM | POA: Insufficient documentation

## 2021-01-02 DIAGNOSIS — R0602 Shortness of breath: Secondary | ICD-10-CM | POA: Diagnosis present

## 2021-01-02 DIAGNOSIS — Z20822 Contact with and (suspected) exposure to covid-19: Secondary | ICD-10-CM | POA: Diagnosis not present

## 2021-01-02 HISTORY — DX: Unspecified asthma, uncomplicated: J45.909

## 2021-01-02 LAB — RESP PANEL BY RT-PCR (FLU A&B, COVID) ARPGX2
Influenza A by PCR: NEGATIVE
Influenza B by PCR: NEGATIVE
SARS Coronavirus 2 by RT PCR: NEGATIVE

## 2021-01-02 MED ORDER — IPRATROPIUM-ALBUTEROL 0.5-2.5 (3) MG/3ML IN SOLN
3.0000 mL | Freq: Once | RESPIRATORY_TRACT | Status: AC
  Administered 2021-01-02: 12:00:00 3 mL via RESPIRATORY_TRACT
  Filled 2021-01-02: qty 3

## 2021-01-02 MED ORDER — ALBUTEROL SULFATE HFA 108 (90 BASE) MCG/ACT IN AERS
4.0000 | INHALATION_SPRAY | Freq: Once | RESPIRATORY_TRACT | Status: AC
  Administered 2021-01-02: 10:00:00 4 via RESPIRATORY_TRACT
  Filled 2021-01-02: qty 6.7

## 2021-01-02 MED ORDER — ALBUTEROL SULFATE HFA 108 (90 BASE) MCG/ACT IN AERS: 1.0000 | INHALATION_SPRAY | Freq: Four times a day (QID) | RESPIRATORY_TRACT | 0 refills | Status: AC | PRN

## 2021-01-02 MED ORDER — PREDNISONE 20 MG PO TABS
60.0000 mg | ORAL_TABLET | Freq: Once | ORAL | Status: AC
  Administered 2021-01-02: 10:00:00 60 mg via ORAL
  Filled 2021-01-02: qty 3

## 2021-01-02 MED ORDER — PREDNISONE 20 MG PO TABS: 40.0000 mg | ORAL_TABLET | Freq: Every day | ORAL | 0 refills | Status: AC

## 2021-01-02 NOTE — ED Notes (Signed)
Pt ambulated independently without any difficulty. Gait steady and even. HR remained in the 80's while her oxygen saturation remained 95% and above while ambulating.

## 2021-01-02 NOTE — ED Notes (Signed)
Patient Alert and oriented to baseline. Stable and ambulatory to baseline. Patient verbalized understanding of the discharge instructions.  Patient belongings were taken by the patient.   

## 2021-01-02 NOTE — ED Triage Notes (Signed)
Pt reports history of asthma and reactive airway disease.  States she was waking up feeling hot and SOB during the night.  Runny nose this morning.  Using inhaler without relief.

## 2021-01-02 NOTE — ED Provider Notes (Signed)
[facility] EMERGENCY DEPARTMENT Provider Note   CSN: [id number] Arrival date & time: 01/02/21  4818     History No chief complaint on file.   [name] is a 40 y.o. female with a past medical history significant for asthma, hypertension, PCOS, and seizures who presents to the ED due to worsening shortness of breath and nasal congestion that started last night.  Patient endorses chest tightness.  No history of blood clots, recent surgeries or recent long immobilizations.  She has an IUD for birth control.  Denies cough.  No fever or chills.  No sick contacts or known COVID exposures.  Patient states she feels like her symptoms are similar to past asthma exacerbations.  She has been using her albuterol inhaler with no relief.  Shortness of breath worse with exertion.  History obtained from patient and past medical records. No interpreter used during encounter.       Past Medical History:  Diagnosis Date   Asthma    Cancer ([place])    cancer in my leg   Common migraine with intractable migraine 05/11/2016   Hypertension    Migraines    PCOS (polycystic ovarian syndrome)    Reactive airway disease    Seizures ([facility])    c/b anesthesia    Patient Active Problem List   Diagnosis Date Noted   Common migraine with intractable migraine 05/11/2016   Axillary lump 06/25/2012    Past Surgical History:  Procedure Laterality Date   CESAREAN SECTION     one previous   HERNIA REPAIR     NASAL RECONSTRUCTION       OB History     Gravida  1   Para  1   Term  1   Preterm      AB      Living  1      SAB      IAB      Ectopic      Multiple      Live Births              Family History  Problem Relation Age of Onset   Breast cancer Maternal Aunt    Diabetes Maternal Grandmother    Diabetes Paternal Grandfather    Cancer Maternal Aunt        ovarian    Social History   Tobacco Use   Smoking status: Never   Smokeless tobacco: Never   Substance Use Topics   Alcohol use: Yes    Comment: occasionally   Drug use: No    Home Medications Prior to Admission medications   Medication Sig Start Date End Date Taking? Authorizing Provider  albuterol (VENTOLIN HFA) 108 (90 Base) MCG/ACT inhaler Inhale 1-2 puffs into the lungs every 6 (six) hours as needed for wheezing or shortness of breath. 01/02/21  Yes Columbia Pandey, Druscilla Brownie, PA-C  predniSONE (DELTASONE) 20 MG tablet Take 2 tablets (40 mg total) by mouth daily for 5 days. 01/02/21 01/07/21 Yes Devantae Babe, Druscilla Brownie, PA-C  acetaminophen (TYLENOL) 650 MG CR tablet Take 650-1,300 mg by mouth 2 (two) times daily as needed for pain.    [provider]  acetaZOLAMIDE (DIAMOX) 250 MG tablet Take 4 tablets (1,000 mg total) by mouth daily. 09/13/20   Lomax, Amy, NP  cetirizine (ZYRTEC) 10 MG tablet Take 10 mg by mouth daily as needed for allergies or rhinitis.    [provider]  eletriptan (RELPAX) 40 MG tablet Take 1 tab onset  of migraine and repeat in 2 hours if needed. (max 2/24hr) 03/17/19   Lomax, Amy, NP  gabapentin (NEURONTIN) 100 MG capsule Take 2 capsules (200 mg total) by mouth at bedtime. 09/13/20   Lomax, Amy, NP  levonorgestrel (MIRENA) 20 MCG/24HR IUD 1 each by Intrauterine route once.    [provider]  loratadine (CLARITIN) 10 MG tablet Take 10 mg by mouth daily as needed for allergies or rhinitis.    [provider]  ondansetron (ZOFRAN) 4 MG tablet Take 1 tablet (4 mg total) by mouth every 8 (eight) hours as needed for nausea or vomiting. 12/03/18   Lomax, Amy, NP  Rimegepant Sulfate (NURTEC) 75 MG TBDP Take 75 mg by mouth daily as needed (take for abortive therapy of migraine, no more than 1 tablet in 24 hours or 10 per month). 12/14/20   Lomax, Amy, NP  topiramate (TOPAMAX) 200 MG tablet Take 1 tablet (200 mg total) by mouth at bedtime. 09/13/20   Lomax, Amy, NP  valACYclovir (VALTREX) 500 MG tablet Take 500 mg by mouth 2 (two) times daily as needed  (as needed for lesions).    [provider]    Allergies    Amoxicillin, Aspirin, Ibuprofen, Adhesive [tape], and Venlafaxine hcl  Review of Systems   Review of Systems  Constitutional:  Negative for chills and fever.  Respiratory:  Positive for chest tightness and shortness of breath. Negative for cough.   Cardiovascular:  Negative for chest pain and leg swelling.  Gastrointestinal:  Negative for abdominal pain.  All other systems reviewed and are negative.  Physical Exam Updated Vital Signs BP 126/74   Pulse 77   Temp 98.9 F (37.2 C) (Oral)   Resp 14   LMP 12/19/2020   SpO2 100%   Physical Exam Vitals and nursing note reviewed.  Constitutional:      General: She is not in acute distress.    Appearance: She is not ill-appearing.  HENT:     Head: Normocephalic.  Eyes:     Pupils: Pupils are equal, round, and reactive to light.  Cardiovascular:     Rate and Rhythm: Normal rate and regular rhythm.     Pulses: Normal pulses.     Heart sounds: Normal heart sounds. No murmur heard.   No friction rub. No gallop.  Pulmonary:     Effort: Pulmonary effort is normal.     Breath sounds: Normal breath sounds.     Comments: No wheeze. Mild decreased air movement. Abdominal:     General: Abdomen is flat. There is no distension.     Palpations: Abdomen is soft.     Tenderness: There is no abdominal tenderness. There is no guarding or rebound.  Musculoskeletal:        General: Normal range of motion.     Cervical back: Neck supple.     Comments: No lower extremity edema. No calf tenderness bilaterally.  Skin:    General: Skin is warm and dry.  Neurological:     General: No focal deficit present.     Mental Status: She is alert.  Psychiatric:        Mood and Affect: Mood normal.        Behavior: Behavior normal.    ED Results / Procedures / Treatments   Labs (all labs ordered are listed, but only abnormal results are displayed) Labs Reviewed  RESP PANEL BY  RT-PCR (FLU A&B, COVID) ARPGX2    EKG None  Radiology DG Chest  2 View  Result Date: 01/02/2021 CLINICAL DATA:  Shortness of breath.  Fever.  Rhinorrhea. EXAM: CHEST - 2 VIEW COMPARISON:  01/17/2020 FINDINGS: The heart size and mediastinal contours are within normal limits. Both lungs are clear. Mild thoracic dextroscoliosis is unchanged. IMPRESSION: No active cardiopulmonary disease. Stable mild scoliosis. Electronically Signed   By: Marlaine Hind M.D.   On: 01/02/2021 08:13    Procedures Procedures   Medications Ordered in ED Medications  albuterol (VENTOLIN HFA) 108 (90 Base) MCG/ACT inhaler 4 puff (4 puffs Inhalation Given 01/02/21 1006)  predniSONE (DELTASONE) tablet 60 mg (60 mg Oral Given 01/02/21 1007)  ipratropium-albuterol (DUONEB) 0.5-2.5 (3) MG/3ML nebulizer solution 3 mL (3 mLs Nebulization Given 01/02/21 1145)    ED Course  I have reviewed the triage vital signs and the nursing notes.  Pertinent labs & imaging results that were available during my care of the patient were reviewed by me and considered in my medical decision making (see chart for details).    MDM Rules/Calculators/A&P                           40 year old female with history of asthma who presents to the ED due to shortness of breath and nasal congestion that started last night.  Patient states symptoms feel similar to past asthma exacerbations.  She has been using her albuterol inhaler with no relief.  Upon arrival, patient afebrile, not tachycardic or hypoxic.  Patient in no acute distress.  Mild decreased air movement.  No wheeze.  No lower extremity edema.  Low suspicion for PE/DVT.  Suspect symptoms related to asthma exacerbation.  COVID/influenza negative.  Chest x-ray ordered at triage which I personally reviewed which is negative for signs of pneumonia, pneumothorax, or widened mediastinum.  Will give albuterol and prednisone and reassess patient.  Upon reassessment, patient admits to improvement  in shortness of breath after nebulizer treatment.  Lungs clear to auscultation bilaterally.  No signs of respiratory distress.  Patient able to ambulate in the ED and maintain O2 saturation above 95%.  Patient discharged with albuterol and prednisone for asthma exacerbation.  Advised patient follow-up with PCP symptoms unimproved over the next week. Strict ED precautions discussed with patient. Patient states understanding and agrees to plan. Patient discharged home in no acute distress and stable vitals  Final Clinical Impression(s) / ED Diagnoses Final diagnoses:  Mild intermittent asthma with exacerbation    Rx / DC Orders ED Discharge Orders          Ordered    albuterol (VENTOLIN HFA) 108 (90 Base) MCG/ACT inhaler  Every 6 hours PRN        01/02/21 1229    predniSONE (DELTASONE) 20 MG tablet  Daily        01/02/21 1229             Karie Kirks 01/02/21 1247    Regan Lemming, MD 01/02/21 1805

## 2021-01-02 NOTE — Discharge Instructions (Addendum)
It was a pleasure taking care of you today.  As discussed, I suspect your symptoms are related to an asthma exacerbation.  Your chest x-ray did not show any signs of pneumonia.  I am sending you home with a refill on your albuterol inhaler and steroids.  Take steroids for the next 5 days.  Please follow-up with PCP within the next week for further evaluation.  Return to the ER for new or worsening symptoms.

## 2021-01-10 ENCOUNTER — Encounter (HOSPITAL_COMMUNITY): Payer: Self-pay | Admitting: Emergency Medicine

## 2021-01-10 ENCOUNTER — Other Ambulatory Visit: Payer: Self-pay

## 2021-01-10 ENCOUNTER — Ambulatory Visit (HOSPITAL_COMMUNITY)
Admission: EM | Admit: 2021-01-10 | Discharge: 2021-01-10 | Disposition: A | Discharge status: Home / Self Care | Payer: No Typology Code available for payment source | Attending: Sports Medicine | Admitting: Sports Medicine

## 2021-01-10 DIAGNOSIS — M65831 Other synovitis and tenosynovitis, right forearm: Secondary | ICD-10-CM | POA: Diagnosis not present

## 2021-01-10 DIAGNOSIS — M25531 Pain in right wrist: Secondary | ICD-10-CM

## 2021-01-10 NOTE — ED Triage Notes (Signed)
Pt reports having right wrist pain since Saturday. 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

## 2021-01-10 NOTE — ED Provider Notes (Signed)
[place]    CSN: [id number] Arrival date & time: 01/10/21  3086      History   Chief Complaint Chief Complaint  Patient presents with   Wrist Pain    HPI [name] is a 40 y.o. female here for right wrist pain.  Right wrist pain since Saturday.  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  She states the pain came on insidiously without inciting event or trauma.  He noticed some swelling and pain on the radial dorsal aspect of her distal wrist.  Initially it was somewhat red over the weekend, however this but redness has resolved but her pain and swelling continues.  She denies any cut or laceration to this area.,  She did just finish this dose of steroids for an exacerbation, last dose was on Friday.  She denies any previous injury to the wrist.  She has been using ice and heat, otherwise has not taken any medications for the pain.  No fever or chills or self.  Pain is worse with movement about the wrist specifically extension and flexion of the wrist.  Past Medical History:  Diagnosis Date   Asthma    Cancer ([place])    cancer in my leg   Common migraine with intractable migraine 05/11/2016   Hypertension    Migraines    PCOS (polycystic ovarian syndrome)    Reactive airway disease    Seizures ([facility])    c/b anesthesia    Patient Active Problem List   Diagnosis Date Noted   Common migraine with intractable migraine 05/11/2016   Axillary lump 06/25/2012    Past Surgical History:  Procedure Laterality Date   CESAREAN SECTION     one previous   HERNIA REPAIR     NASAL RECONSTRUCTION      OB History     Gravida  1   Para  1   Term  1   Preterm      AB      Living  1      SAB      IAB      Ectopic      Multiple      Live Births               Home Medications    Prior to Admission medications   Medication Sig Start Date End Date Taking? Authorizing Provider   acetaminophen (TYLENOL) 650 MG CR tablet Take 650-1,300 mg by mouth 2 (two) times daily as needed for pain.    [provider]  acetaZOLAMIDE (DIAMOX) 250 MG tablet Take 4 tablets (1,000 mg total) by mouth daily. 09/13/20   Lomax, Amy, NP  albuterol (VENTOLIN HFA) 108 (90 Base) MCG/ACT inhaler Inhale 1-2 puffs into the lungs every 6 (six) hours as needed for wheezing or shortness of breath. 01/02/21   Suzy Bouchard, PA-C  cetirizine (ZYRTEC) 10 MG tablet Take 10 mg by mouth daily as needed for allergies or rhinitis.    [provider]  eletriptan (RELPAX) 40 MG tablet Take 1 tab onset of migraine and repeat in 2 hours if needed. (max 2/24hr) 03/17/19   Lomax, Amy, NP  gabapentin (NEURONTIN) 100 MG capsule Take 2 capsules (200 mg total) by mouth at bedtime. 09/13/20   Lomax, Amy, NP  levonorgestrel (MIRENA) 20 MCG/24HR IUD 1 each by Intrauterine route once.    [provider]  loratadine (CLARITIN)  10 MG tablet Take 10 mg by mouth daily as needed for allergies or rhinitis.    [provider]  ondansetron (ZOFRAN) 4 MG tablet Take 1 tablet (4 mg total) by mouth every 8 (eight) hours as needed for nausea or vomiting. 12/03/18   Lomax, Amy, NP  Rimegepant Sulfate (NURTEC) 75 MG TBDP Take 75 mg by mouth daily as needed (take for abortive therapy of migraine, no more than 1 tablet in 24 hours or 10 per month). 12/14/20   Lomax, Amy, NP  topiramate (TOPAMAX) 200 MG tablet Take 1 tablet (200 mg total) by mouth at bedtime. 09/13/20   Lomax, Amy, NP  valACYclovir (VALTREX) 500 MG tablet Take 500 mg by mouth 2 (two) times daily as needed (as needed for lesions).    [provider]    Family History Family History  Problem Relation Age of Onset   Breast cancer Maternal Aunt    Diabetes Maternal Grandmother    Diabetes Paternal Grandfather    Cancer Maternal Aunt        ovarian    Social History Social History   Tobacco Use   Smoking status: Never    Smokeless tobacco: Never  Substance Use Topics   Alcohol use: Yes    Comment: occasionally   Drug use: No     Allergies   Amoxicillin, Aspirin, Ibuprofen, Adhesive [tape], and Venlafaxine hcl   Review of Systems Review of Systems  Constitutional:  Negative for chills, fatigue and fever.  Musculoskeletal:  Positive for arthralgias (Right wrist pain) and joint swelling.  Skin:  Positive for color change. Negative for rash and wound.  Neurological:  Negative for dizziness and weakness.    Physical Exam Triage Vital Signs ED Triage Vitals  Enc Vitals Group     BP 01/10/21 1925 (!) 149/78     Pulse Rate 01/10/21 1925 61     Resp 01/10/21 1925 15     Temp 01/10/21 1930 98.5 F (36.9 C)     Temp Source 01/10/21 1930 Tympanic     SpO2 01/10/21 1925 100 %     Weight --      Height --      Head Circumference --      Peak Flow --      Pain Score 01/10/21 1923 8     Pain Loc --      Pain Edu? --      Excl. in [place]? --    No data found.  Updated Vital Signs BP (!) 149/78 (BP Location: Left Arm)   Pulse 61   Temp 98.5 F (36.9 C) (Tympanic)   Resp 15   LMP 12/19/2020   SpO2 100%   Physical Exam Constitutional:      General: She is not in acute distress.    Appearance: Normal appearance.  Eyes:     Comments: + conjunctival injection b/l   Cardiovascular:     Rate and Rhythm: Normal rate.  Pulmonary:     Effort: Pulmonary effort is normal.  Musculoskeletal:        General: Swelling (Right dorsal radial wrist) present.     Comments: + TTP distal radial wrist approximately 4 cm proximal to the radial styloid, there is surrounding swelling over this area, mild warmth, no erythema  Skin:    Capillary Refill: Capillary refill takes less than 2 seconds.  Neurological:     Mental Status: She is alert.      UC Treatments / Results  Labs (all labs ordered are listed, but only abnormal results are displayed) Labs Reviewed - No data to display  EKG   Radiology No  results found.  Procedures Procedures (including critical care time)  Medications Ordered in UC Medications - No data to display  Initial Impression / Assessment and Plan / UC Course  I have reviewed the triage vital signs and the nursing notes.  Pertinent labs & imaging results that were available during my care of the patient were reviewed by me and considered in my medical decision making (see chart for details).     Right wrist pain Intersection syndrome of right wrist - DC 2 and 3 NSAID intolerance - GI upset  Patient has pain and swelling of the dorsal wrist, bedside ultrasound did note hypoechoic fusion surrounding the second and third dorsal compartments.  On lab ultrasound examination, there is impingement with snapping over of the ECRL/ECRB tendon scanning proximally up the forearm. + Pain over this area with sono palpation.  The patient is unable to take NSAIDs secondary to GI upset.  We put her in a thumb spica brace today to immobilize the wrist.  She is to ice this area for 20 minutes at least 4 times daily.  She may take Tylenol for pain control.  Did provide her information for the local sports medicine center for better evaluation with live ultrasound and treatment modalities as able.  Strict return precautions were provided.  She is safe for discharge home. Final Clinical Impressions(s) / UC Diagnoses   Final diagnoses:  Extensor intersection syndrome of right wrist  Right wrist pain     Discharge Instructions      - Continue in wrist brace with activity, you may come out of this when sleeping - Ice the area for 20 minutes at least 3 times daily -Avoid picking up anything with the wrist greater than 5 pounds until being seen by sports medicine clinic -Tylenol twice daily for the pain  -If redness increases, the area becomes very warm, or you herself get any fever or chills, would recommend return to urgent care or ED     ED Prescriptions   None    PDMP  not reviewed this encounter.   Elba Barman, DO 01/10/21 2101

## 2021-01-10 NOTE — Discharge Instructions (Addendum)
-   Continue in wrist brace with activity, you may come out of this when sleeping - Ice the area for 20 minutes at least 3 times daily -Avoid picking up anything with the wrist greater than 5 pounds until being seen by sports medicine clinic -Tylenol twice daily for the pain  -If redness increases, the area becomes very warm, or you herself get any fever or chills, would recommend return to urgent care or ED

## 2021-01-19 ENCOUNTER — Ambulatory Visit: Payer: Self-pay

## 2021-01-19 ENCOUNTER — Ambulatory Visit (INDEPENDENT_AMBULATORY_CARE_PROVIDER_SITE_OTHER): Payer: No Typology Code available for payment source | Admitting: Sports Medicine

## 2021-01-19 VITALS — BP 110/80 | Ht 66.5 in | Wt 165.0 lb

## 2021-01-19 DIAGNOSIS — M25531 Pain in right wrist: Secondary | ICD-10-CM

## 2021-01-19 DIAGNOSIS — M65831 Other synovitis and tenosynovitis, right forearm: Secondary | ICD-10-CM | POA: Diagnosis not present

## 2021-01-19 MED ORDER — METHYLPREDNISOLONE ACETATE 40 MG/ML IJ SUSP
20.0000 mg | Freq: Once | INTRAMUSCULAR | Status: AC
Start: 2021-01-19 — End: 2021-01-19
  Administered 2021-01-19: 11:00:00 20 mg via INTRA_ARTICULAR

## 2021-01-19 NOTE — Patient Instructions (Signed)
It was great to see you again today, thank you for letting me participate in your care!  Today, we discussed your wrist pain, which is most indicative of intersection syndrome, which is inflammation around the tendons of the wrist.  Things for you to do: -Try to shut the wrist down for the next few days.  You may use ice at least 3 times daily for the next couple days for any postinjection pain or soreness. -Remain in your wrist brace over the next week, as your pain subsides you may come out of this as needed.  If the pain returns I would recommend continuing using the wrist brace with activity. -Try over-the-counter Voltaren gel and rub this over the painful area to help with the pain and swelling  You will follow-up in about 4 weeks if your pain is not improving.  If you have any further questions, please give the clinic a call [phone].  Cheers,  Elba Barman, [place]

## 2021-01-19 NOTE — Progress Notes (Signed)
PCP: Seward Carol, MD  Subjective:   HPI: Patient is a 40 y.o. female here for right wrist pain.  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  She denies any specific event or trauma, but notes she uses her hand and wrist very often with repetitive movement at her job.  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  Over the last 1.5 weeks she has been icing the wrist a few times a day and wearing her thumb spica splint.  She states this has helped improve her pain to a decent extent although she still has pain over the site and specifically with extension exercises about the wrist.  Her pain is usually around a 3/10 at beginning of the day, but will increase to an 8/10-2008 near the end of the day after activity. She is right-hand dominant.  She has an intolerance to NSAIDs with bad GI upset, and states when she receives some intravenously in the past she had some localized allergic reaction.  Past Medical History:  Diagnosis Date   Asthma    Cancer ([place])    cancer in my leg   Common migraine with intractable migraine 05/11/2016   Hypertension    Migraines    PCOS (polycystic ovarian syndrome)    Reactive airway disease    Seizures ([facility])    c/b anesthesia    Current Outpatient Medications on File Prior to Visit  Medication Sig Dispense Refill   acetaminophen (TYLENOL) 650 MG CR tablet Take 650-1,300 mg by mouth 2 (two) times daily as needed for pain.     acetaZOLAMIDE (DIAMOX) 250 MG tablet Take 4 tablets (1,000 mg total) by mouth daily. 360 tablet 3   albuterol (VENTOLIN HFA) 108 (90 Base) MCG/ACT inhaler Inhale 1-2 puffs into the lungs every 6 (six) hours as needed for wheezing or shortness of breath. 18 g 0   cetirizine (ZYRTEC) 10 MG tablet Take 10 mg by mouth daily as needed for allergies or rhinitis.     eletriptan (RELPAX) 40 MG tablet Take 1 tab  onset of migraine and repeat in 2 hours if needed. (max 2/24hr) 10 tablet 11   gabapentin (NEURONTIN) 100 MG capsule Take 2 capsules (200 mg total) by mouth at bedtime. 180 capsule 3   levonorgestrel (MIRENA) 20 MCG/24HR IUD 1 each by Intrauterine route once.     loratadine (CLARITIN) 10 MG tablet Take 10 mg by mouth daily as needed for allergies or rhinitis.     ondansetron (ZOFRAN) 4 MG tablet Take 1 tablet (4 mg total) by mouth every 8 (eight) hours as needed for nausea or vomiting. 30 tablet 3   Rimegepant Sulfate (NURTEC) 75 MG TBDP Take 75 mg by mouth daily as needed (take for abortive therapy of migraine, no more than 1 tablet in 24 hours or 10 per month). 8 tablet 11   topiramate (TOPAMAX) 200 MG tablet Take 1 tablet (200 mg total) by mouth at bedtime. 90 tablet 3   valACYclovir (VALTREX) 500 MG tablet Take 500 mg by mouth 2 (two) times daily as needed (as needed for lesions).     No current facility-administered medications on file prior to visit.    Past Surgical History:  Procedure Laterality Date   CESAREAN SECTION     one previous   HERNIA REPAIR     NASAL RECONSTRUCTION  Allergies  Allergen Reactions   Amoxicillin Hives, Nausea And Vomiting and Nausea Only    Has patient had a PCN reaction causing immediate rash, facial/tongue/throat swelling, SOB or lightheadedness with hypotension: Yes Has patient had a PCN reaction causing severe rash involving mucus membranes or skin necrosis: No Has patient had a PCN reaction that required hospitalization: No Has patient had a PCN reaction occurring within the last 10 years: No If all of the above answers are "NO", then may proceed with Cephalosporin use.    Aspirin Hives, Nausea And Vomiting and Nausea Only   Ibuprofen Hives, Nausea And Vomiting and Nausea Only   Adhesive [Tape] Rash and Other (See Comments)    CERTAIN BANDAGES BREAK OUT THE SKIN !!   Venlafaxine Hcl Palpitations    BP 110/80    Ht 5' 6.5" (1.689 m)    Wt  165 lb (74.8 kg)    BMI 26.23 kg/m   No flowsheet data found.  No flowsheet data found.      Objective:  Physical Exam:  Gen: Well-appearing, in no acute distress; non-toxic CV: Regular Rate. Well-perfused. Warm.  Resp: Breathing unlabored on room air; no wheezing. Psych: Fluid speech in conversation; appropriate affect; normal thought process Neuro: Sensation intact throughout. No gross coordination deficits.  MSK:  - Right wrist: + TTP over distal radial wrist about 4cm proximal to radial styloid.  Inspection yields some soft tissue swelling over this area.  There is no redness or increased warmth of this area.  There is no bony tenderness or bony deformity.  Range of motion is full in all directions, although there is pain with wrist extension and radial deviation.  No TTP within the anatomical snuffbox or radial/ulnar styloid.  Strength is 5/5 in all directions, although pain with forced flexion and resisted extension about the wrist.  Negative Finkelstein's testing.  MSK Limited wrist ultrasound performed, right  -Lister's tubercle was identified in short axis with subsequent second and third dorsal compartment on either side.  Third dorsal compartment was identified without tendon irregularity. -APL and EPB tendons were identified in the first dorsal compartment of the wrist without notable tendon irregularity or hypoechoic surrounding fluid. -ECRL and ECRB tendons were identified within the second dorsal compartment of the wrist without notable tendon irregularity.  There is a very mild degree of surrounding hypoechoic fluid. -In dynamic scan, as she we scanned proximally in the wrist there is evidence of some hypoechoic fluid changes and neovascularization as the first dorsal compartment crosses over top of the second dorsal compartment. There is subsequent + sonopalpation over this area.  No significant impingement or subluxation noted.   IMPRESSION: Intersection syndrome of 1st  and 2nd dorsal compartment with hypoechoic fluid and mild neovascularization.     Assessment & Plan:  1. Right wrist pain - 2/2 to intersection syndrome.  Evidence of hypoechoic fluid changes with local tenosynovitis as the first dorsal compartment crosses over the second dorsal compartment.  This was mildly improved with ice and thumb spica splint, however patient still has significant pain that does not allow her to do her daily activities at work. 2. Intolerance to NSAID's  Procedure:  After informed written consent timeout was performed, patient was seated on exam table. The patient's right dorsal wrist was prepped with Betadine and alcohol swab. Ultrasound guidance with sterile gel was used in short axis plane to identify the point of APL/EPB tendons crossing over the ECRL/ECRB tendons and identify hypoechoic space between.  Utilizing an  out of plane, walk-down approach the needle guidance was visualized between the first and second dorsal compartment with subsequent injection of 0.5:0.5 cc of lidocaine:depomedrol combination between the tendon sheaths. Patient tolerated the procedure well without immediate complications.  Plan: -Through shared decision-making, we elected to proceed with cortisone injection between the first and second dorsal compartment sheaths.  Patient tolerated procedure well. -She is to rest the wrist from excessive motion for the next few days.  She may use ice multiple times a day for any postinjection pain or continued pain over the wrist. -Recommend continuing in the thumb spica wrist brace for the next week, she may slowly come out of this as her pain improves.  Would recommend continuing the use with heavy activity. -Discussed the option of over-the-counter Voltaren gel or other topical agents to help with pain and swelling -She will follow-up in about 4 weeks if her pain is not improved  Elba Barman, DO PGY-4, Sports Medicine Fellow [facility]  Addendum:  Patient seen in the office by fellow.  His history, exam, plan of care were precepted with me.  Karlton Lemon MD Kirt Boys

## 2021-03-28 ENCOUNTER — Other Ambulatory Visit: Payer: Self-pay | Admitting: Gastroenterology

## 2021-03-28 DIAGNOSIS — R109 Unspecified abdominal pain: Secondary | ICD-10-CM

## 2021-03-28 DIAGNOSIS — R110 Nausea: Secondary | ICD-10-CM

## 2021-03-28 DIAGNOSIS — R1310 Dysphagia, unspecified: Secondary | ICD-10-CM

## 2021-04-06 ENCOUNTER — Ambulatory Visit
Admission: RE | Admit: 2021-04-06 | Discharge: 2021-04-06 | Disposition: A | Discharge status: Home / Self Care | Payer: No Typology Code available for payment source | Source: Ambulatory Visit | Attending: Gastroenterology | Admitting: Gastroenterology

## 2021-04-06 DIAGNOSIS — R110 Nausea: Secondary | ICD-10-CM

## 2021-04-06 DIAGNOSIS — R109 Unspecified abdominal pain: Secondary | ICD-10-CM

## 2021-04-06 DIAGNOSIS — R1310 Dysphagia, unspecified: Secondary | ICD-10-CM

## 2021-04-07 ENCOUNTER — Emergency Department (HOSPITAL_COMMUNITY)
Admission: EM | Admit: 2021-04-07 | Discharge: 2021-04-08 | Disposition: A | Discharge status: Home / Self Care | Payer: No Typology Code available for payment source | Attending: Emergency Medicine | Admitting: Emergency Medicine

## 2021-04-07 ENCOUNTER — Other Ambulatory Visit: Payer: Self-pay

## 2021-04-07 DIAGNOSIS — R1031 Right lower quadrant pain: Secondary | ICD-10-CM | POA: Insufficient documentation

## 2021-04-07 DIAGNOSIS — N9489 Other specified conditions associated with female genital organs and menstrual cycle: Secondary | ICD-10-CM | POA: Diagnosis not present

## 2021-04-07 DIAGNOSIS — R102 Pelvic and perineal pain: Secondary | ICD-10-CM | POA: Diagnosis not present

## 2021-04-07 DIAGNOSIS — R1030 Lower abdominal pain, unspecified: Secondary | ICD-10-CM | POA: Diagnosis present

## 2021-04-07 DIAGNOSIS — R1032 Left lower quadrant pain: Secondary | ICD-10-CM | POA: Insufficient documentation

## 2021-04-07 DIAGNOSIS — Z20822 Contact with and (suspected) exposure to covid-19: Secondary | ICD-10-CM | POA: Diagnosis not present

## 2021-04-07 LAB — CBC WITH DIFFERENTIAL/PLATELET
Abs Immature Granulocytes: 0.02 10*3/uL (ref 0.00–0.07)
Basophils Absolute: 0 10*3/uL (ref 0.0–0.1)
Basophils Relative: 1 %
Eosinophils Absolute: 0.1 10*3/uL (ref 0.0–0.5)
Eosinophils Relative: 1 %
HCT: 43.1 % (ref 36.0–46.0)
Hemoglobin: 14 g/dL (ref 12.0–15.0)
Immature Granulocytes: 0 %
Lymphocytes Relative: 32 %
Lymphs Abs: 1.9 10*3/uL (ref 0.7–4.0)
MCH: 31.3 pg (ref 26.0–34.0)
MCHC: 32.5 g/dL (ref 30.0–36.0)
MCV: 96.4 fL (ref 80.0–100.0)
Monocytes Absolute: 0.2 10*3/uL (ref 0.1–1.0)
Monocytes Relative: 4 %
Neutro Abs: 3.5 10*3/uL (ref 1.7–7.7)
Neutrophils Relative %: 62 %
Platelets: 244 10*3/uL (ref 150–400)
RBC: 4.47 MIL/uL (ref 3.87–5.11)
RDW: 13.6 % (ref 11.5–15.5)
WBC: 5.7 10*3/uL (ref 4.0–10.5)
nRBC: 0 % (ref 0.0–0.2)

## 2021-04-07 LAB — URINALYSIS, ROUTINE W REFLEX MICROSCOPIC
Bilirubin Urine: NEGATIVE
Glucose, UA: NEGATIVE mg/dL
Ketones, ur: 20 mg/dL — AB
Nitrite: NEGATIVE
Protein, ur: NEGATIVE mg/dL
Specific Gravity, Urine: 1.024 (ref 1.005–1.030)
pH: 5 (ref 5.0–8.0)

## 2021-04-07 LAB — COMPREHENSIVE METABOLIC PANEL
ALT: 12 U/L (ref 0–44)
AST: 15 U/L (ref 15–41)
Albumin: 4.4 g/dL (ref 3.5–5.0)
Alkaline Phosphatase: 54 U/L (ref 38–126)
Anion gap: 10 (ref 5–15)
BUN: 17 mg/dL (ref 6–20)
CO2: 19 mmol/L — ABNORMAL LOW (ref 22–32)
Calcium: 9.2 mg/dL (ref 8.9–10.3)
Chloride: 111 mmol/L (ref 98–111)
Creatinine, Ser: 1.06 mg/dL — ABNORMAL HIGH (ref 0.44–1.00)
GFR, Estimated: >60 mL/min (ref >60)
Glucose, Bld: 89 mg/dL (ref 70–99)
Potassium: 3.3 mmol/L — ABNORMAL LOW (ref 3.5–5.1)
Sodium: 140 mmol/L (ref 135–145)
Total Bilirubin: 0.6 mg/dL (ref 0.3–1.2)
Total Protein: 6.9 g/dL (ref 6.5–8.1)

## 2021-04-07 LAB — I-STAT BETA HCG BLOOD, ED (MC, WL, AP ONLY): I-stat hCG, quantitative: <5 m[IU]/mL (ref <5)

## 2021-04-07 NOTE — ED Provider Notes (Signed)
[facility] EMERGENCY DEPARTMENT Provider Note   CSN: [id number] Arrival date & time: 04/07/21  1712     History  Chief Complaint  Patient presents with   Abdominal Pain    [name] is a 41 y.o. female.  Patient presents to the emergency department for evaluation of abdominal pain.  Patient has been experiencing lower abdominal and pelvic pain for about a year.  Symptoms have worsened in the last month and she has been seen by GI and OB/GYN for the symptoms.  She called GI today and told them that she has been running a low-grade fever this week, was told to come to the ER and get a CAT scan.      Home Medications Prior to Admission medications   Medication Sig Start Date End Date Taking? Authorizing Provider  acetaminophen (TYLENOL) 650 MG CR tablet Take 650-1,300 mg by mouth 2 (two) times daily as needed for pain.   Yes [provider]  acetaZOLAMIDE (DIAMOX) 250 MG tablet Take 4 tablets (1,000 mg total) by mouth daily. 09/13/20  Yes Lomax, Amy, NP  albuterol (VENTOLIN HFA) 108 (90 Base) MCG/ACT inhaler Inhale 1-2 puffs into the lungs every 6 (six) hours as needed for wheezing or shortness of breath. 01/02/21  Yes Aberman, Druscilla Brownie, PA-C  cetirizine (ZYRTEC) 10 MG tablet Take 10 mg by mouth daily as needed for allergies or rhinitis.   Yes [provider]  gabapentin (NEURONTIN) 100 MG capsule Take 2 capsules (200 mg total) by mouth at bedtime. Patient taking differently: Take 200 mg by mouth daily. 09/13/20  Yes Lomax, Amy, NP  hyoscyamine (LEVSIN SL) 0.125 MG SL tablet Place 1 tablet (0.125 mg total) under the tongue every 4 (four) hours as needed for cramping (abdominal pain). 04/08/21  Yes Heber Hoog, Gwenyth Allegra, MD  levonorgestrel (MIRENA) 20 MCG/24HR IUD 1 each by Intrauterine route once.   Yes [provider]  loratadine (CLARITIN) 10 MG tablet Take 10 mg by mouth daily as needed for allergies or rhinitis.   Yes [provider]  ondansetron (ZOFRAN) 4 MG tablet Take 1 tablet (4 mg total) by mouth every 8 (eight) hours as needed for nausea or vomiting. 12/03/18  Yes Lomax, Amy, NP  pantoprazole (PROTONIX) 40 MG tablet Take 40 mg by mouth every morning. 03/28/21  Yes [provider]  Rimegepant Sulfate (NURTEC) 75 MG TBDP Take 75 mg by mouth daily as needed (take for abortive therapy of migraine, no more than 1 tablet in 24 hours or 10 per month). 12/14/20  Yes Lomax, Amy, NP  topiramate (TOPAMAX) 200 MG tablet Take 1 tablet (200 mg total) by mouth at bedtime. Patient taking differently: Take 200 mg by mouth daily. 09/13/20  Yes Lomax, Amy, NP  valACYclovir (VALTREX) 500 MG tablet Take 500 mg by mouth 2 (two) times daily as needed (as needed for lesions).   Yes [provider]  eletriptan (RELPAX) 40 MG tablet Take 1 tab onset of migraine and repeat in 2 hours if needed. (max 2/24hr) Patient not taking: Reported on 04/08/2021 03/17/19   [name], NP      Allergies    Amoxicillin, Aspirin, Ibuprofen, Adhesive [tape], and Venlafaxine hcl    Review of Systems   Review of Systems  Gastrointestinal:  Positive for abdominal pain.   Physical Exam Updated Vital Signs BP 122/81    Pulse 61    Temp 98.2 F (36.8 C)    Resp 19  SpO2 100%  Physical Exam Vitals and nursing note reviewed.  Constitutional:      General: She is not in acute distress.    Appearance: She is well-developed.  HENT:     Head: Normocephalic and atraumatic.     Mouth/Throat:     Mouth: Mucous membranes are moist.  Eyes:     General: Vision grossly intact. Gaze aligned appropriately.     Extraocular Movements: Extraocular movements intact.     Conjunctiva/sclera: Conjunctivae normal.  Cardiovascular:     Rate and Rhythm: Normal rate and regular rhythm.     Pulses: Normal pulses.     Heart sounds: Normal heart sounds, S1 normal and S2 normal. No murmur heard.   No friction rub. No gallop.  Pulmonary:     Effort:  Pulmonary effort is normal. No respiratory distress.     Breath sounds: Normal breath sounds.  Abdominal:     General: Bowel sounds are normal.     Palpations: Abdomen is soft.     Tenderness: There is abdominal tenderness in the right lower quadrant, suprapubic area and left lower quadrant. There is no guarding or rebound.     Hernia: No hernia is present.  Musculoskeletal:        General: No swelling.     Cervical back: Full passive range of motion without pain, normal range of motion and neck supple. No spinous process tenderness or muscular tenderness. Normal range of motion.     Right lower leg: No edema.     Left lower leg: No edema.  Skin:    General: Skin is warm and dry.     Capillary Refill: Capillary refill takes less than 2 seconds.     Findings: No ecchymosis, erythema, rash or wound.  Neurological:     General: No focal deficit present.     Mental Status: She is alert and oriented to person, place, and time.     GCS: GCS eye subscore is 4. GCS verbal subscore is 5. GCS motor subscore is 6.     Cranial Nerves: Cranial nerves 2-12 are intact.     Sensory: Sensation is intact.     Motor: Motor function is intact.     Coordination: Coordination is intact.  Psychiatric:        Attention and Perception: Attention normal.        Mood and Affect: Mood normal.        Speech: Speech normal.        Behavior: Behavior normal.    ED Results / Procedures / Treatments   Labs (all labs ordered are listed, but only abnormal results are displayed) Labs Reviewed  COMPREHENSIVE METABOLIC PANEL - Abnormal; Notable for the following components:      Result Value   Potassium 3.3 (*)    CO2 19 (*)    Creatinine, Ser 1.06 (*)    All other components within normal limits  URINALYSIS, ROUTINE W REFLEX MICROSCOPIC - Abnormal; Notable for the following components:   Hgb urine dipstick MODERATE (*)    Ketones, ur 20 (*)    Leukocytes,Ua TRACE (*)    Bacteria, UA RARE (*)    All other  components within normal limits  RESP PANEL BY RT-PCR (FLU A&B, COVID) ARPGX2  CBC WITH DIFFERENTIAL/PLATELET  I-STAT BETA HCG BLOOD, ED ([facility], [facility], [facility] ONLY)    EKG None  Radiology US Abdomen Complete  Result Date: 04/07/2021 CLINICAL DATA:  41 year old female with 2 months of abdominal pain.  EXAM: ABDOMEN ULTRASOUND COMPLETE COMPARISON:  CT Abdomen and Pelvis 04/16/2020. FINDINGS: Gallbladder: No gallstones or wall thickening visualized. No sonographic Murphy sign noted by sonographer. Common bile duct: Diameter: 4 mm, normal. Liver: Normal background liver echogenicity. No discrete liver lesion identified by ultrasound. Portal vein is patent on color Doppler imaging with normal direction of blood flow towards the liver. IVC: No abnormality visualized. Pancreas: Visualized portion unremarkable. Spleen: Size and appearance within normal limits. Right Kidney: Length: 10.8 cm. Echogenicity within normal limits. No mass or hydronephrosis visualized. Left Kidney: Length: 11.2 cm. Echogenicity within normal limits. No mass or hydronephrosis visualized. Abdominal aorta: No aneurysm visualized. Other findings: None. IMPRESSION: Normal ultrasound appearance of the abdomen. Electronically Signed   By: Genevie Ann M.D.   On: 04/07/2021 08:58   CT ABDOMEN PELVIS W CONTRAST  Result Date: 04/08/2021 CLINICAL DATA:  Abdominal pain, acute, nonlocalized. Right lower quadrant abdominal pain. EXAM: CT ABDOMEN AND PELVIS WITH CONTRAST TECHNIQUE: Multidetector CT imaging of the abdomen and pelvis was performed using the standard protocol following bolus administration of intravenous contrast. RADIATION DOSE REDUCTION: This exam was performed according to the departmental dose-optimization program which includes automated exposure control, adjustment of the mA and/or kV according to patient size and/or use of iterative reconstruction technique. CONTRAST:  128mL OMNIPAQUE IOHEXOL 300 MG/ML  SOLN COMPARISON:  04/16/2020 FINDINGS:  Lower chest: No acute abnormality. Hepatobiliary: Subcapsular cyst or hemangioma within the right hepatic lobe is stable measuring 15 mm at axial image # 28/3. Liver otherwise unremarkable. Gallbladder unremarkable. No intra or extrahepatic biliary ductal dilation Pancreas: Unremarkable Spleen: Unremarkable Adrenals/Urinary Tract: The adrenal glands are unremarkable. The kidneys are normal in size and position. Multiple nonobstructing calculi are seen within the left kidney measuring up to 4 mm. No hydronephrosis. Evaluation of the pelvis is markedly limited by extensive streak artifact from barium within the large bowel. Distal ureters obscured. Bladder is largely obscured. Stomach/Bowel: The visualized large and small bowel are unremarkable, though again, evaluation is markedly limited by extensive streak artifact. Despite this, I suspect the appendix is normal and fills with barium contrast and air, best seen on image # 60-62 within the right lower quadrant. No free intraperitoneal gas. Vascular/Lymphatic: The visualized abdominal vasculature is unremarkable. Reproductive: Largely obscured by streak artifact. Intrauterine device in expected position within the uterus. Other: No abdominal wall hernia. Musculoskeletal: No acute or significant osseous findings. IMPRESSION: Limited examination secondary to extensive streak artifact from retained barium contrast throughout the large bowel. No definite acute intra-abdominal pathology identified. Normal appendix. Mild nonobstructing left nephrolithiasis. Electronically Signed   By: Fidela Salisbury M.D.   On: 04/08/2021 01:15   DG ESOPHAGUS W DOUBLE CM (HD)  Result Date: 04/06/2021 CLINICAL DATA:  Patient reports dysphasia, nausea, food getting stuck when eating and regurgitation. Patient endorses sour taste upon waking. EXAM: ESOPHAGUS/BARIUM SWALLOW/TABLET STUDY TECHNIQUE: Combined double and single contrast examination was performed using effervescent crystals,  high-density barium, and thin liquid barium. One-half inch barium tablet was also administered. This exam was performed by Narda Rutherford, NP, and was supervised and interpreted by Fabiola Backer, MD. FLUOROSCOPY TIME:  Radiation Exposure Index (as provided by the fluoroscopic device): 12.126 mGy COMPARISON:  None. FINDINGS: Swallowing: Unremarkable. No vestibular penetration or aspiration seen. Pharynx: Unremarkable. Esophagus: Normal appearance. Esophageal motility: Interruption in contraction wave that leads to delay in esophageal clearance. Hiatal Hernia: None. Gastroesophageal reflux: Scant amount of reflux noted in distal third of the esophagus. Ingested 65mm barium tablet: Passed normally. Other: None.  IMPRESSION: 1.  Mild esophageal dysmotility. 2.  Minimal gastroesophageal reflux. Electronically Signed   By: Titus Dubin M.D.   On: 04/06/2021 10:58    Procedures Procedures    Medications Ordered in ED Medications  iohexol (OMNIPAQUE) 300 MG/ML solution 100 mL (100 mLs Intravenous Contrast Given 04/08/21 0100)    ED Course/ Medical Decision Making/ A&P                           Medical Decision Making Amount and/or Complexity of Data Reviewed Radiology: ordered.  Risk Prescription drug management.   Patient presents to the emergency department for evaluation of persistent lower abdominal and pelvic pain.  Symptoms ongoing for months.  Patient reports that the pain is largely unchanged but she has been experiencing subjective fevers over the last week.  She was told by GI to come to the ER to get a CAT scan.  Differential diagnosis considered includes cholelithiasis, gastritis, appendicitis, PID, small bowel obstruction, viral illness.  Patient is afebrile here.  Vital signs are otherwise normal including no signs of tachycardia, no hypotension.  Abdominal examination reveals diffuse tenderness.  She reports that this has been ongoing for up to 1 year.  She did not take her temperature  at home, it is not known if she actually had a fever, she felt feverish.  Patient has been worked up by OB/GYN for this pain and it was felt to be unrelated to gynecologic origin.  GI is currently working her up, she did have a barium swallow 2 days ago.  This was unremarkable.  Patient recommended to come to the ER for further evaluation including CT scan.  This has been performed and is unremarkable, other than streak artifact from the barium.  Examination does not reveal any evidence of acute surgical process.  Her lab work was reassuring.  This is a chronic condition that can continue to be worked up as an outpatient.  She does not require hospitalization or further testing in the ED.        Final Clinical Impression(s) / ED Diagnoses Final diagnoses:  Lower abdominal pain    Rx / DC Orders ED Discharge Orders          Ordered    hyoscyamine (LEVSIN SL) 0.125 MG SL tablet  Every 4 hours PRN        04/08/21 0335              Orpah Greek, MD 04/08/21 [phone]

## 2021-04-07 NOTE — ED Triage Notes (Addendum)
Pt sent here by GI for abd pain w/ low grade fevers. Pt has had abd cramps w/ nausea intermittently over the last year but 1 month ago it became constant. Pt states she has had intermittent vomiting, denies diarrhea/constipation/urinary symptoms. Pt has seen obgyn and GI, all ultrasounds have been negative. GI told her to come here for a CT scan and possible endoscopy.  ?

## 2021-04-07 NOTE — ED Provider Triage Note (Signed)
Emergency Medicine Provider Triage Evaluation Note ? ?[name] , a 42 y.o. female  was evaluated in triage.  Pt complains of abdominal pain over the last year, worsened over the last month.  Occasional nausea/vomiting.  No constipation diarrhea.  Complains of subjective fever.  Sent by GI to get a scan per patient ? ?Review of Systems  ?Positive: Abdominal pain, N/V ?Negative: Fever, constipation/diarrhea ? ?Physical Exam  ?BP (!) 147/85 (BP Location: Right Arm)   Pulse 73   Temp 99.1 ?F (37.3 ?C) (Oral)   Resp 14   SpO2 100%  ?Gen:   Awake, no distress   ?Resp:  Normal effort  ?MSK:   Moves extremities without difficulty  ?Other:  Diffuse abdominal pain; Flat affect ? ?Medical Decision Making  ?Medically screening exam initiated at 5:26 PM.  Appropriate orders placed.  [name] was informed that the remainder of the evaluation will be completed by another provider, this initial triage assessment does not replace that evaluation, and the importance of remaining in the ED until their evaluation is complete. ? ?Labs ordered ?  ?Prince Rome, PA-C ?67/54/49 1735 ? ?

## 2021-04-08 ENCOUNTER — Emergency Department (HOSPITAL_COMMUNITY): Payer: No Typology Code available for payment source

## 2021-04-08 LAB — RESP PANEL BY RT-PCR (FLU A&B, COVID) ARPGX2
Influenza A by PCR: NEGATIVE
Influenza B by PCR: NEGATIVE
SARS Coronavirus 2 by RT PCR: NEGATIVE

## 2021-04-08 MED ORDER — HYOSCYAMINE SULFATE 0.125 MG SL SUBL: 0.1250 mg | SUBLINGUAL_TABLET | SUBLINGUAL | 0 refills | Status: DC | PRN

## 2021-04-08 MED ORDER — IOHEXOL 300 MG/ML  SOLN
100.0000 mL | Freq: Once | INTRAMUSCULAR | Status: AC | PRN
  Administered 2021-04-08: 100 mL via INTRAVENOUS

## 2021-04-12 ENCOUNTER — Other Ambulatory Visit: Payer: Self-pay | Admitting: Gastroenterology

## 2021-04-12 ENCOUNTER — Other Ambulatory Visit (HOSPITAL_COMMUNITY): Payer: Self-pay | Admitting: Gastroenterology

## 2021-04-12 DIAGNOSIS — R1084 Generalized abdominal pain: Secondary | ICD-10-CM

## 2021-04-12 DIAGNOSIS — R110 Nausea: Secondary | ICD-10-CM

## 2021-04-21 ENCOUNTER — Other Ambulatory Visit: Payer: Self-pay

## 2021-04-21 ENCOUNTER — Ambulatory Visit (HOSPITAL_COMMUNITY)
Admission: RE | Admit: 2021-04-21 | Discharge: 2021-04-21 | Disposition: A | Discharge status: Home / Self Care | Payer: No Typology Code available for payment source | Source: Ambulatory Visit | Attending: Gastroenterology | Admitting: Gastroenterology

## 2021-04-21 DIAGNOSIS — R1084 Generalized abdominal pain: Secondary | ICD-10-CM | POA: Diagnosis present

## 2021-04-21 DIAGNOSIS — R110 Nausea: Secondary | ICD-10-CM | POA: Insufficient documentation

## 2021-04-21 MED ORDER — TECHNETIUM TC 99M MEBROFENIN IV KIT
5.3500 | PACK | Freq: Once | INTRAVENOUS | Status: AC | PRN
  Administered 2021-04-21: 5.35 via INTRAVENOUS

## 2021-05-19 ENCOUNTER — Other Ambulatory Visit: Payer: Self-pay

## 2021-05-19 ENCOUNTER — Emergency Department (HOSPITAL_COMMUNITY)
Admission: EM | Admit: 2021-05-19 | Discharge: 2021-05-20 | Disposition: A | Discharge status: Home / Self Care | Payer: No Typology Code available for payment source | Attending: Emergency Medicine | Admitting: Emergency Medicine

## 2021-05-19 ENCOUNTER — Encounter (HOSPITAL_COMMUNITY): Payer: Self-pay | Admitting: *Deleted

## 2021-05-19 DIAGNOSIS — E162 Hypoglycemia, unspecified: Secondary | ICD-10-CM | POA: Insufficient documentation

## 2021-05-19 DIAGNOSIS — Z85828 Personal history of other malignant neoplasm of skin: Secondary | ICD-10-CM | POA: Insufficient documentation

## 2021-05-19 DIAGNOSIS — J45909 Unspecified asthma, uncomplicated: Secondary | ICD-10-CM | POA: Diagnosis not present

## 2021-05-19 DIAGNOSIS — R531 Weakness: Secondary | ICD-10-CM | POA: Diagnosis not present

## 2021-05-19 DIAGNOSIS — I10 Essential (primary) hypertension: Secondary | ICD-10-CM | POA: Insufficient documentation

## 2021-05-19 DIAGNOSIS — R079 Chest pain, unspecified: Secondary | ICD-10-CM | POA: Diagnosis not present

## 2021-05-19 DIAGNOSIS — R5381 Other malaise: Secondary | ICD-10-CM | POA: Insufficient documentation

## 2021-05-19 LAB — URINALYSIS, ROUTINE W REFLEX MICROSCOPIC
Bilirubin Urine: NEGATIVE
Glucose, UA: NEGATIVE mg/dL
Hgb urine dipstick: NEGATIVE
Ketones, ur: NEGATIVE mg/dL
Nitrite: NEGATIVE
Protein, ur: NEGATIVE mg/dL
Specific Gravity, Urine: 1.009 (ref 1.005–1.030)
pH: 7 (ref 5.0–8.0)

## 2021-05-19 LAB — BASIC METABOLIC PANEL
Anion gap: 6 (ref 5–15)
BUN: 15 mg/dL (ref 6–20)
CO2: 18 mmol/L — ABNORMAL LOW (ref 22–32)
Calcium: 9 mg/dL (ref 8.9–10.3)
Chloride: 118 mmol/L — ABNORMAL HIGH (ref 98–111)
Creatinine, Ser: 1.04 mg/dL — ABNORMAL HIGH (ref 0.44–1.00)
GFR, Estimated: >60 mL/min (ref >60)
Glucose, Bld: 82 mg/dL (ref 70–99)
Potassium: 3.2 mmol/L — ABNORMAL LOW (ref 3.5–5.1)
Sodium: 142 mmol/L (ref 135–145)

## 2021-05-19 LAB — TSH: TSH: 0.241 u[IU]/mL — ABNORMAL LOW (ref 0.350–4.500)

## 2021-05-19 LAB — CBC
HCT: 44.7 % (ref 36.0–46.0)
Hemoglobin: 13.9 g/dL (ref 12.0–15.0)
MCH: 30 pg (ref 26.0–34.0)
MCHC: 31.1 g/dL (ref 30.0–36.0)
MCV: 96.5 fL (ref 80.0–100.0)
Platelets: 230 10*3/uL (ref 150–400)
RBC: 4.63 MIL/uL (ref 3.87–5.11)
RDW: 13.1 % (ref 11.5–15.5)
WBC: 5.1 10*3/uL (ref 4.0–10.5)
nRBC: 0 % (ref 0.0–0.2)

## 2021-05-19 LAB — I-STAT BETA HCG BLOOD, ED (MC, WL, AP ONLY): I-stat hCG, quantitative: <5 m[IU]/mL (ref <5)

## 2021-05-19 LAB — CBG MONITORING, ED
Glucose-Capillary: 70 mg/dL (ref 70–99)
Glucose-Capillary: 89 mg/dL (ref 70–99)

## 2021-05-19 LAB — MAGNESIUM: Magnesium: 1.9 mg/dL (ref 1.7–2.4)

## 2021-05-19 LAB — PHOSPHORUS: Phosphorus: 2.6 mg/dL (ref 2.5–4.6)

## 2021-05-19 NOTE — ED Triage Notes (Signed)
Pt arrived by [facility]. Pt was at work and reports "not feeling well" on ems arrival, cbg was 36, pt drank OJ and then it was87. Patient reports chest pain, no relief with otc meds, also has headache this am.   ?

## 2021-05-19 NOTE — ED Notes (Signed)
RN introduced self to patient and obtained CBG of 89. Pt appearing to have moderate difficulty finding words/organizing thoughts. Speech pattern slow with some delay in responses. Pt is alert and oriented at this time. Pt ambulated from waiting room to assigned bed without difficulty. Pt states she has been have discomfort in her chest, believed to be indigestion, and has been taking prescribed medication as well as over the counter medication for the last few days.  ?

## 2021-05-19 NOTE — ED Provider Triage Note (Signed)
Emergency Medicine Provider Triage Evaluation Note ? ?[name] , a 41 y.o. female  was evaluated in triage.  Pt complains of weakness. ? ?Review of Systems  ?Positive: Weakness, fatigue, trouble swallowing, abd pain, indigestion ?Negative: Fever, cough, dysuria ? ?Physical Exam  ?BP 138/85 (BP Location: Right Arm)   Pulse 78   Temp 98.6 ?F (37 ?C) (Oral)   Resp 20   LMP  (LMP Unknown)   SpO2 100%  ?Gen:   Awake, no distress   ?Resp:  Normal effort  ?MSK:   Moves extremities without difficulty  ?Other:  exopthalmos ? ?Medical Decision Making  ?Medically screening exam initiated at 11:42 AM.  Appropriate orders placed.  [name] was informed that the remainder of the evaluation will be completed by another provider, this initial triage assessment does not replace that evaluation, and the importance of remaining in the ED until their evaluation is complete. ? ?Pt report feeling fatigue for several months, worse for the past few days.  EMS report CBG of 36 this AM, improves with OJ.   ?  ?Domenic Moras, PA-C ?05/19/21 1148 ? ?

## 2021-05-20 ENCOUNTER — Emergency Department (HOSPITAL_COMMUNITY): Payer: No Typology Code available for payment source

## 2021-05-20 LAB — TROPONIN I (HIGH SENSITIVITY): Troponin I (High Sensitivity): 3 ng/L (ref <18)

## 2021-05-20 NOTE — Discharge Instructions (Signed)
You were evaluated in the Emergency Department and after careful evaluation, we did not find any emergent condition requiring admission or further testing in the hospital. ? ?Your exam/testing today is overall reassuring.  Recommend follow-up with your primary care doctor to further discuss your symptoms. ? ?Please return to the Emergency Department if you experience any worsening of your condition.   Thank you for allowing [place] to be a part of your care. ?

## 2021-05-20 NOTE — ED Notes (Signed)
Patient transported to CT 

## 2021-05-20 NOTE — ED Provider Notes (Signed)
?[place] DEPT ?[facility] Emergency Department ?Provider Note ?MRN:  [id number]  ?Arrival date & time: 05/20/21    ? ?Chief Complaint   ?Weakness ?  ?History of Present Illness   ?[name] is a 41 y.o. year-old female with a history of hypertension presenting to the ED with chief complaint of weakness. ? ?Feeling very weak today.  Reportedly had an episode of hypoglycemia, intervened by EMS.  Having some chest pain today.  Described as a dull pressure.  No shortness of breath, no leg pain or swelling.  Has been following with GI for acid reflux. ? ?Review of Systems  ?A thorough review of systems was obtained and all systems are negative except as noted in the HPI and PMH.  ? ?Patient's Health History   ? ?Past Medical History:  ?Diagnosis Date  ? Asthma   ? Cancer [facility])   ? cancer in my leg  ? Common migraine with intractable migraine 05/11/2016  ? Hypertension   ? Migraines   ? PCOS (polycystic ovarian syndrome)   ? Reactive airway disease   ? Seizures ([place])   ? c/b anesthesia  ?  ?Past Surgical History:  ?Procedure Laterality Date  ? CESAREAN SECTION    ? one previous  ? HERNIA REPAIR    ? NASAL RECONSTRUCTION    ?  ?Family History  ?Problem Relation Age of Onset  ? Breast cancer Maternal Aunt   ? Diabetes Maternal Grandmother   ? Diabetes Paternal Grandfather   ? Cancer Maternal Aunt   ?     ovarian  ?  ?Social History  ? ?Socioeconomic History  ? Marital status: Married  ?  Spouse name: Not on file  ? Number of children: Not on file  ? Years of education: Not on file  ? Highest education level: Not on file  ?Occupational History  ? Not on file  ?Tobacco Use  ? Smoking status: Never  ? Smokeless tobacco: Never  ?Substance and Sexual Activity  ? Alcohol use: Yes  ?  Comment: occasionally  ? Drug use: No  ? Sexual activity: Yes  ?  Birth control/protection: Pill  ?Other Topics Concern  ? Not on file  ?Social History Narrative  ? Not on file  ? ?Social Determinants of Health  ? ?Financial  Resource Strain: Not on file  ?Food Insecurity: Not on file  ?Transportation Needs: Not on file  ?Physical Activity: Not on file  ?Stress: Not on file  ?Social Connections: Not on file  ?Intimate Partner Violence: Not on file  ?  ? ?Physical Exam  ? ?Vitals:  ? 05/19/21 2113 05/20/21 0103  ?BP: 122/78 118/73  ?Pulse: (!) 54 (!) 53  ?Resp: 16 18  ?Temp: 98.1 ?F (36.7 ?C)   ?SpO2: 100% 100%  ?  ?CONSTITUTIONAL: Well-appearing, NAD ?NEURO/PSYCH:  Alert and oriented x 3, no focal deficits ?EYES:  eyes equal and reactive ?ENT/NECK:  no LAD, no JVD ?CARDIO: Regular rate, well-perfused, normal S1 and S2 ?PULM:  CTAB no wheezing or rhonchi ?GI/GU:  non-distended, non-tender ?MSK/SPINE:  No gross deformities, no edema ?SKIN:  no rash, atraumatic ? ? ?*Additional and/or pertinent findings included in MDM below ? ?Diagnostic and Interventional Summary  ? ? EKG Interpretation ? ?Date/Time:  Thursday May 19 2021 11:31:41 EDT ?Ventricular Rate:  72 ?PR Interval:  166 ?QRS Duration: 80 ?QT Interval:  394 ?QTC Calculation: 431 ?R Axis:   54 ?Text Interpretation: Normal sinus rhythm Normal ECG When compared with ECG of 02-Jan-2021  10:43, PREVIOUS ECG IS PRESENT Confirmed by Gerlene Fee [phone]) on 05/20/2021 12:32:11 AM ?  ? ?  ? ?Labs Reviewed  ?BASIC METABOLIC PANEL - Abnormal; Notable for the following components:  ?    Result Value  ? Potassium 3.2 (*)   ? Chloride 118 (*)   ? CO2 18 (*)   ? Creatinine, Ser 1.04 (*)   ? All other components within normal limits  ?URINALYSIS, ROUTINE W REFLEX MICROSCOPIC - Abnormal; Notable for the following components:  ? Leukocytes,Ua TRACE (*)   ? Bacteria, UA FEW (*)   ? All other components within normal limits  ?TSH - Abnormal; Notable for the following components:  ? TSH 0.241 (*)   ? All other components within normal limits  ?CBC  ?MAGNESIUM  ?PHOSPHORUS  ?CBG MONITORING, ED  ?I-STAT BETA HCG BLOOD, ED ([facility], [facility], [facility] ONLY)  ?CBG MONITORING, ED  ?TROPONIN I (HIGH SENSITIVITY)  ?TROPONIN I  (HIGH SENSITIVITY)  ?  ?CT HEAD WO CONTRAST (5MM)  ?Final Result  ?  ?DG Chest Port 1 View  ?Final Result  ?  ?  ?Medications - No data to display  ? ?Procedures  /  Critical Care ?Procedures ? ?ED Course and Medical Decision Making  ?Initial Impression and Ddx ?Vague complaints today, weakness, malaise, endorsing some mild chest pain.  Not pleuritic, no leg pain or swelling, PERC negative.  Awaiting screening chest x-ray, troponin.  Also had an episode of hypoglycemia today.  No documented history of diabetes, will monitor for any recurrence.  Could be EMS lab error. ? ?Past medical/surgical history that increases complexity of ED encounter: None ? ?Interpretation of Diagnostics ?I personally reviewed the EKG and my interpretation is as follows: Sinus rhythm ?   ?Labs are reassuring with no significant blood count or electrolyte disturbance.  Blood sugar on reassessment multiple times is normal.  Given the question of transient altered mental status CT head was obtained which is normal.  Minimal abnormality of TSH.  Nothing to suggest emergent process. ? ?Patient Reassessment and Ultimate Disposition/Management ?Patient sleeping comfortably and easily wakes on reassessment.  Appropriate for discharge. ? ?Patient management required discussion with the following services or consulting groups:  None ? ?Complexity of Problems Addressed ?Acute illness or injury that poses threat of life of bodily function ? ?Additional Data Reviewed and Analyzed ?Further history obtained from: ?None ? ?Additional Factors Impacting ED Encounter Risk ?None ? ?Barth Kirks. Sedonia Small, MD ?[facility] Emergency Medicine ?[email or web address] ? ?Final Clinical Impressions(s) / ED Diagnoses  ? ?  ICD-10-CM   ?1. Weakness  R53.1   ?  ?2. Chest pain, unspecified type  R07.9   ?  ?3. Hypoglycemia  E16.2   ?  ?  ?ED Discharge Orders   ? ? None  ? ?  ?  ? ?Discharge Instructions Discussed with and Provided to Patient:   ? ? ? ?Discharge Instructions   ? ?  ?You were evaluated in the Emergency Department and after careful evaluation, we did not find any emergent condition requiring admission or further testing in the hospital. ? ?Your exam/testing today is overall reassuring.  Recommend follow-up with your primary care doctor to further discuss your symptoms. ? ?Please return to the Emergency Department if you experience any worsening of your condition.   Thank you for allowing [place] to be a part of your care. ? ? ? ? ?  ?Maudie Flakes, MD ?05/20/21 [phone] ? ?

## 2021-05-20 NOTE — ED Notes (Signed)
Pt returned from CT °

## 2021-07-13 ENCOUNTER — Other Ambulatory Visit (HOSPITAL_COMMUNITY)
Admission: RE | Admit: 2021-07-13 | Discharge: 2021-07-13 | Disposition: A | Discharge status: Home / Self Care | Payer: No Typology Code available for payment source | Source: Ambulatory Visit | Attending: Obstetrics and Gynecology | Admitting: Obstetrics and Gynecology

## 2021-07-13 ENCOUNTER — Other Ambulatory Visit: Payer: Self-pay | Admitting: Obstetrics and Gynecology

## 2021-07-13 DIAGNOSIS — Z01419 Encounter for gynecological examination (general) (routine) without abnormal findings: Secondary | ICD-10-CM | POA: Insufficient documentation

## 2021-07-14 LAB — CYTOLOGY - PAP
Comment: NEGATIVE
Diagnosis: NEGATIVE
High risk HPV: NEGATIVE

## 2021-09-13 ENCOUNTER — Ambulatory Visit (INDEPENDENT_AMBULATORY_CARE_PROVIDER_SITE_OTHER): Payer: No Typology Code available for payment source | Admitting: Family Medicine

## 2021-09-13 ENCOUNTER — Encounter: Payer: Self-pay | Admitting: Family Medicine

## 2021-09-13 VITALS — BP 115/75 | HR 55 | Ht 66.0 in | Wt 170.0 lb

## 2021-09-13 DIAGNOSIS — G43009 Migraine without aura, not intractable, without status migrainosus: Secondary | ICD-10-CM

## 2021-09-13 DIAGNOSIS — G932 Benign intracranial hypertension: Secondary | ICD-10-CM

## 2021-09-13 MED ORDER — TOPIRAMATE 100 MG PO TABS: 100.0000 mg | ORAL_TABLET | Freq: Every day | ORAL | 1 refills | Status: DC

## 2021-09-13 MED ORDER — ACETAZOLAMIDE 250 MG PO TABS
1000.0000 mg | ORAL_TABLET | Freq: Every day | ORAL | 3 refills | Status: DC
Start: 2021-09-13 — End: 2022-03-29

## 2021-09-13 MED ORDER — NURTEC 75 MG PO TBDP: 75.0000 mg | ORAL_TABLET | ORAL | 3 refills | Status: DC

## 2021-09-13 NOTE — Patient Instructions (Signed)
Below is our plan:  We will change Nurtec to every other day dosing for migraine prevention. Continue topiramate '200mg'$  daily for the next two weeks then decrease dose to '100mg'$  daily. Continue acetazolamide '1000mg'$  daily. Use Tylenol up to '1000mg'$  every 6-8 hours as needed for abortive therapy.   We will check in in 4-6 weeks and see how you are doing. We may decrease topiramate to '50mg'$  daily at that time if doing well.   Please do not get pregnant.   Follow up closely with PCP and schedule eye exam ASAP.   Please make sure you are staying well hydrated. I recommend 50-60 ounces daily. Well balanced diet and regular exercise encouraged. Consistent sleep schedule with 6-8 hours recommended.   Please continue follow up with care team as directed.   Follow up with me in 6 months.   You may receive a survey regarding today's visit. I encourage you to leave honest feed back as I do use this information to improve patient care. Thank you for seeing me today!

## 2021-09-13 NOTE — Progress Notes (Signed)
PATIENT: [name] DOB: [birth year]  REASON FOR VISIT: follow up HISTORY FROM: patient  Chief Complaint  Patient presents with   RM 18    Patient here alone for follow-up. She feels her symptoms have been ok. She has headaches here or there. She feels tired everyday. She is still taking Diamox 1000 mg daily. She is also on Topamax. She states she has gaps and knows words she wants to say but can't get them out.     HISTORY OF PRESENT ILLNESS:  09/13/21 ALL:  [name] returns for follow up for IIH. She continues acetazolamide '1000mg'$ , topiramate '200mg'$  and gabapentin '200mg'$  at bedtime. Nurtec used for abortive therapy. She is having mild headaches 6-7 times a month. She is having migrainous headaches about 4 times a month. Nurtec works well. She reports last eye exam was at least 2 years ago. No vision changes or TOV. She works swing shifts. 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. She knows that diet and hydration are triggers. She usually drinks 120 ounces of water daily. She has gained about 10lbs over the past year. She does not sleep well. She is having continued concerns of fatigue and brain fog. She has trouble with word finding.   09/13/2020 ALL: [name] returns for annual follow up for IIH. She continues acetazolamide '1000mg'$  daily (couldn't remember BID dosing), topiramate '200mg'$  daily and gabapentin '200mg'$  at bedtime. Nurtec used for abortive therapy. She feels this works better than naratriptan. She was doing very well with rare headaches but reports being out of gabapentin for about 6 months (didn't request refills) and off topiramate and acetazolamide for the past two weeks (pharmacy back order). When taking current regimen as prescribed she feels headaches are well managed. No vision changes. Last eye exam about a year ago.   09/15/2019 ALL:  [name] is a 41 y.o. female here today for follow up of IIH. She continues Diamox '750mg'$  BID, topiramate '200mg'$  and gabapentin '200mg'$  at bedtime. She uses  eletriptan for abortive therapy. She denies any concerns of headaches. She may have a mild headache from time to time. She is doing well. She reports from time to time, she will have twitching of her eye but it resolves spontaneously. She stays well hydrated. She was last seen by ophthalmology in 03/2019. She does not have scheduled follow up.   03/17/2019 ALL: [name] is a 41 y.o. female here today for follow up of IIH. She continues Diamox '750mg'$  twice daily, topiramate '200mg'$  and gabapentin '200mg'$  at bedtime. She reports that she has done well until last week. She started having a mild headache about 7 days ago. Headache was centered at the top of her head. Pain was dull aching pain. No light or sound sensitivity but she did have intermittent nausea. Vision was blurry when headache worsened but no vision loss. She reports that headache progressed for 3-4 days. She took one dose of eletriptan on Friday and two doses on Saturday. Sunday the headache was improved. Today headache is better. Blurry vision is significantly improved. She does report allergy symptoms as well. She has not taken allergy medication. She is not taking Excedrin. She is eating well and drinking plenty of water.    01/16/2019 ALL: [name] is a 41 y.o. female here today for follow up of IIH. She continues Diamox '750mg'$  twice daily, topiramate '200mg'$  and gabapentin '200mg'$  at bedtime daily. She reports that headaches did improve with increased dose of Diamox. About three days ago, she felt  a headache starting. She has right sided pressure, worse behind right eye. She was seen yesterday by ER for acute intractable headache treated with metoclopramide and dexamethasone '10mg'$ . She has noted some improvement today but headache continues. She did forget to tell UC provider yesterday that for a period of time, she felt as if she could only see black in the right outer vision fields. Vision is normal today with exception of mild blurriness with  significant pain. She has felt nauseated as well and noticed light sensitivity driving to our appt today. She has not taken any OTC medications or Relpax as she was unsure if this would help. She is usually cautious about taking OTC medications. She uses Mirena for severe dysmenorrhea. We have discussed possible role of Mirena in worsening  IIH, however, data is conflicting. She is also followed very closely by Dr Nelda May, gynecology. Patient feels benefit of controlled menstrual cycles outweighs risks of worsening IIH at this time.    12/03/2018 ALL: [name] is a 41 y.o. female here today for follow up for headaches with new diagnosis of IIH. She has been seen several times in the ER for worsening headaches since being seen by me in 06/2018. At that time, she reported that headaches were doing well on topiramate and gabapentin. Relpax worked well for abortive therapy. She started having worsening of central headaches with pressure behind her eyes about 2-3 weeks ago. She reports that her headache was persistent and unresponsive to therapy. Pressure behind right eye with peripheral vision loss. Last week, LP showed opening pressure of 30. Closing pressure of 12 and headache improved. She was started on Diamox '500mg'$  twice daily and referred back to [place]. She has tolerated Diamox without adverse effects over the past 10 days. She has continued topiramate '200mg'$  and gabapentin '100mg'$  at bedtime. MRI was normal. She has noticed more trouble with her memory. She feels that her words are not coming to her. She has ringing in her ears. She is nauseated at times. She has not had an eye exam. She has Mirena for birth control placed about a year ago. Twin sister was diagnosed with IIH about 10 years ago.      07/04/2018 ALL: [name] is a 41 y.o. female for follow up of migraines. She is taking topirimate '200mg'$  and gabapentin '100mg'$  at bedtime. She uses Relpax for abortive therapy. She does report sleepiness with  Relpax. She has had about 3 migraines in the past year. She has 3-4 headaches per month. She feels that she is doing really well.      HISTORY (copied from [place] note on 10/29/2017   Ms. Boxer is a 41 year old female with a history of migraine headaches.  She returns today for follow-up.  She states overall her migraine headaches are better.  She has not had a migraine in the last month.  She states that she is not headaches at least 3 times a week.  She typically can take Tylenol or Excedrin tension.  She reports that she does not use Relpax as much because she needs to lay down after she takes it.  She states that she works different shifts at her job which also may be affecting her sleep.     HISTORY 04/19/17 Ms. Godfrey  is a 41 year old female with a history of migraine.  She is currently taking 200 mg at bedtime.  She uses Relpax to treat her acute migraines.  She reports that she has had a ongoing headache  for the last 3 weeks.  She reports that her pain fluctuates from a 4 out of 10 to a 7 out of 10.  She reports that the headache has been constant.  She has used Tylenol as well as Relpax.  She reports that these medications has decreased the severity but essentially did not resolve the headache.  She reports the headache usually starts in the center of the head and radiates down to the forehead.  She reports with severe headaches she will have photophobia.  She reports occasionally she will have halos in her vision.  She confirmed that she is also had nausea and vomiting.  She has been using Zofran.  In the past she has had a prednisone Dosepak for that it works well for her headache.  She returns today for evaluation.   REVIEW OF SYSTEMS: Out of a complete 14 system review of symptoms, the patient complains only of the following symptoms, headaches, fatigue and all other reviewed systems are negative.  ALLERGIES: Allergies  Allergen Reactions   Amoxicillin Hives, Nausea And  Vomiting and Nausea Only    Has patient had a PCN reaction causing immediate rash, facial/tongue/throat swelling, SOB or lightheadedness with hypotension: Yes Has patient had a PCN reaction causing severe rash involving mucus membranes or skin necrosis: No Has patient had a PCN reaction that required hospitalization: No Has patient had a PCN reaction occurring within the last 10 years: No If all of the above answers are "NO", then may proceed with Cephalosporin use.    Aspirin Hives, Nausea And Vomiting and Nausea Only   Ibuprofen Hives, Nausea And Vomiting and Nausea Only   Adhesive [Tape] Rash and Other (See Comments)    CERTAIN BANDAGES BREAK OUT THE SKIN !!   Venlafaxine Hcl Palpitations    HOME MEDICATIONS: Outpatient Medications Prior to Visit  Medication Sig Dispense Refill   acetaminophen (TYLENOL) 650 MG CR tablet Take 650-1,300 mg by mouth 2 (two) times daily as needed for pain.     acetaZOLAMIDE (DIAMOX) 250 MG tablet Take 4 tablets (1,000 mg total) by mouth daily. 360 tablet 3   albuterol (VENTOLIN HFA) 108 (90 Base) MCG/ACT inhaler Inhale 1-2 puffs into the lungs every 6 (six) hours as needed for wheezing or shortness of breath. 18 g 0   cetirizine (ZYRTEC) 10 MG tablet Take 10 mg by mouth daily as needed for allergies or rhinitis.     dicyclomine (BENTYL) 10 MG capsule Take 20 mg by mouth 3 (three) times daily.     gabapentin (NEURONTIN) 100 MG capsule Take 2 capsules (200 mg total) by mouth at bedtime. (Patient taking differently: Take 200 mg by mouth daily.) 180 capsule 3   levonorgestrel (MIRENA) 20 MCG/24HR IUD 1 each by Intrauterine route once.     loratadine (CLARITIN) 10 MG tablet Take 10 mg by mouth daily as needed for allergies or rhinitis.     ondansetron (ZOFRAN) 4 MG tablet Take 1 tablet (4 mg total) by mouth every 8 (eight) hours as needed for nausea or vomiting. 30 tablet 3   pantoprazole (PROTONIX) 40 MG tablet Take 40 mg by mouth every morning.     Rimegepant  Sulfate (NURTEC) 75 MG TBDP Take 75 mg by mouth daily as needed (take for abortive therapy of migraine, no more than 1 tablet in 24 hours or 10 per month). 8 tablet 11   topiramate (TOPAMAX) 200 MG tablet Take 1 tablet (200 mg total) by mouth at bedtime. (Patient taking differently:  Take 200 mg by mouth daily.) 90 tablet 3   valACYclovir (VALTREX) 500 MG tablet Take 500 mg by mouth 2 (two) times daily as needed (as needed for lesions).     eletriptan (RELPAX) 40 MG tablet Take 1 tab onset of migraine and repeat in 2 hours if needed. (max 2/24hr) (Patient not taking: Reported on 04/08/2021) 10 tablet 11   fluconazole (DIFLUCAN) 150 MG tablet Take 150 mg by mouth See admin instructions. Take 1 tab now. Repeat in 3 days (Patient not taking: Reported on 05/20/2021)     hyoscyamine (LEVSIN SL) 0.125 MG SL tablet Place 1 tablet (0.125 mg total) under the tongue every 4 (four) hours as needed for cramping (abdominal pain). (Patient not taking: Reported on 05/20/2021) 30 tablet 0   No facility-administered medications prior to visit.    PAST MEDICAL HISTORY: Past Medical History:  Diagnosis Date   Asthma    Cancer ([place])    cancer in my leg   Common migraine with intractable migraine 05/11/2016   Hypertension    Migraines    PCOS (polycystic ovarian syndrome)    Reactive airway disease    Seizures ([facility])    c/b anesthesia    PAST SURGICAL HISTORY: Past Surgical History:  Procedure Laterality Date   CESAREAN SECTION     one previous   HERNIA REPAIR     NASAL RECONSTRUCTION      FAMILY HISTORY: Family History  Problem Relation Age of Onset   Cancer Mother    Kidney cancer Mother    Colon cancer Father    Diabetes Maternal Grandmother    Diabetes Paternal Grandfather    Breast cancer Maternal Aunt    Cancer Maternal Aunt        ovarian    SOCIAL HISTORY: Social History   Socioeconomic History   Marital status: Married    Spouse name: Not on file   Number of children: Not on file    Years of education: Not on file   Highest education level: Not on file  Occupational History   Not on file  Tobacco Use   Smoking status: Never   Smokeless tobacco: Never  Substance and Sexual Activity   Alcohol use: Yes    Comment: occasionally   Drug use: No   Sexual activity: Yes    Birth control/protection: I.U.D.  Other Topics Concern   Not on file  Social History Narrative   Lives at home with husband and son   Right handed   Caffeine: 1 cup/day (coffee or tea)   Social Determinants of Health   Financial Resource Strain: Not on file  Food Insecurity: Not on file  Transportation Needs: Not on file  Physical Activity: Not on file  Stress: Not on file  Social Connections: Not on file  Intimate Partner Violence: Not on file      PHYSICAL EXAM  Vitals:   09/13/21 0902  BP: 115/75  Pulse: (!) 55  Weight: 170 lb (77.1 kg)  Height: '5\' 6"'$  (1.676 m)     Body mass index is 27.44 kg/m.  Generalized: Well developed, in no acute distress  Cardiology: normal rate and rhythm, no murmur noted Respiratory: clear to auscultation bilaterally  Neurological examination  Mentation: Alert oriented to time, place, history taking. Follows all commands speech and language fluent Cranial nerve II-XII: Pupils were equal round reactive to light. Extraocular movements were full, visual field were full on confrontational test. Facial sensation and strength were normal. Head turning and  shoulder shrug  were normal and symmetric. Motor: The motor testing reveals 5 over 5 strength of all 4 extremities. Good symmetric motor tone is noted throughout.  Gait and station: Gait is normal.    DIAGNOSTIC DATA (LABS, IMAGING, TESTING) - I reviewed patient records, labs, notes, testing and imaging myself where available.      No data to display           Lab Results  Component Value Date   WBC 5.1 05/19/2021   HGB 13.9 05/19/2021   HCT 44.7 05/19/2021   MCV 96.5 05/19/2021   PLT  230 05/19/2021      Component Value Date/Time   NA 142 05/19/2021 1152   K 3.2 (L) 05/19/2021 1152   CL 118 (H) 05/19/2021 1152   CO2 18 (L) 05/19/2021 1152   GLUCOSE 82 05/19/2021 1152   BUN 15 05/19/2021 1152   CREATININE 1.04 (H) 05/19/2021 1152   CALCIUM 9.0 05/19/2021 1152   PROT 6.9 04/07/2021 1758   ALBUMIN 4.4 04/07/2021 1758   AST 15 04/07/2021 1758   ALT 12 04/07/2021 1758   ALKPHOS 54 04/07/2021 1758   BILITOT 0.6 04/07/2021 1758   GFRNONAA >60 05/19/2021 1152   GFRAA >60 04/22/2019 1213   No results found for: "CHOL", "HDL", "LDLCALC", "LDLDIRECT", "TRIG", "CHOLHDL" No results found for: "HGBA1C" No results found for: "VITAMINB12" Lab Results  Component Value Date   TSH 0.241 (L) 05/19/2021    ASSESSMENT AND PLAN 41 y.o. year old female  has a past medical history of Asthma, Cancer ([place]), Common migraine with intractable migraine (05/11/2016), Hypertension, Migraines, PCOS (polycystic ovarian syndrome), Reactive airway disease, and Seizures ([place]). here with     ICD-10-CM   1. IIH (idiopathic intracranial hypertension)  G93.2     2. Migraine without aura and without status migrainosus, not intractable  G43.009        Takeyla reports headaches seem fairly well managed, however, she is concerned that fatigue and brain fog may be worsening. She has been on both acetazolamide and topiramate for years. I would like to wean topiramate. I will switch her Nurtec to every other day dosing for migraine prevention. We will lower topiramate to '100mg'$  after she has been on Nurtec QOD for 2 weeks.  We will continue acetazolimide '1000mg'$  and gabapentin '200mg'$  daily. She will [place] Tylenol as needed for abortive therapy. She was encouraged to schedule follow up with ophthalmology asap. I will check in with her in 4-6 weeks and plan to decrease topiramate to '50mg'$  daily if doing well. She was advised against pregnancy. She will continue healthy lifestyle habits. She was encouraged to stay  well hydrated. She will follow up in 1 year, sooner if needed. She verbalizes understanding and agreement with this plan.    No orders of the defined types were placed in this encounter.     No orders of the defined types were placed in this encounter.       [name], FNP-C 09/13/2021, 9:13 AM [facility] [address], [place], [place] [address] [phone]

## 2021-09-14 ENCOUNTER — Telehealth: Payer: Self-pay | Admitting: *Deleted

## 2021-09-14 NOTE — Telephone Encounter (Signed)
Submitted PA Nurtec on CMM. Key: [id number]. Waiting on determination from [facility].

## 2021-09-15 NOTE — Telephone Encounter (Signed)
"  Request Reference Number: [id number]. NURTEC TAB '75MG'$  ODT is approved through 12/15/2021. Your patient may now fill this prescription and it will be covered."

## 2021-10-22 IMAGING — MR MR HEAD W/O CM
10 of 11 series · 43 of 48 positions shown · non-contrast
Comparison: CT head 11/19/2018

CLINICAL DATA: Headache with bilateral eye pain 2 weeks. Focal
neuro deficit. Lumbar puncture last week.

EXAM:
MRI HEAD WITHOUT CONTRAST
TECHNIQUE: Multiplanar, multiecho pulse sequences of the brain and surrounding
structures were obtained without intravenous contrast.

[Series 5: DWI · axial · 3.0mm · 0.88mm/px · z∈[-145,-14]mm · 9 of 92 slices shown (1 of 4)]
[im 1/92]
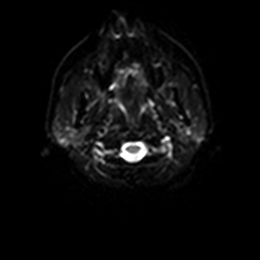
[im 12/92]
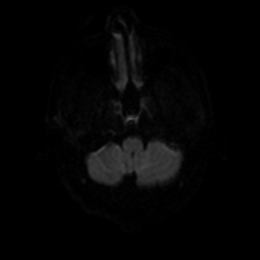
[im 23/92]
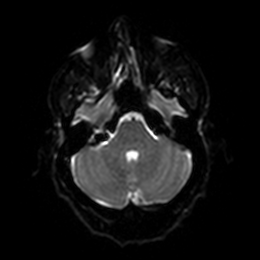
[im 35/92]
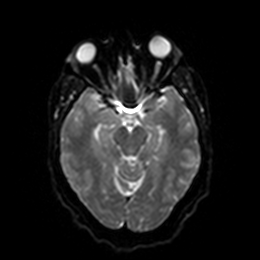
[im 46/92]
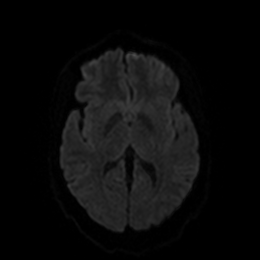
[im 57/92]
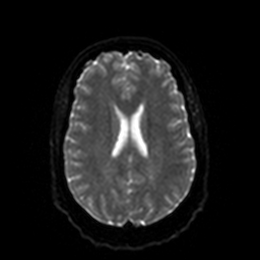
[im 69/92]
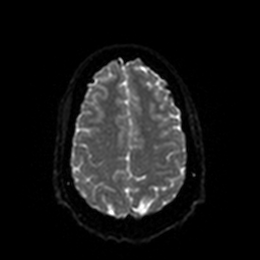
[im 80/92]
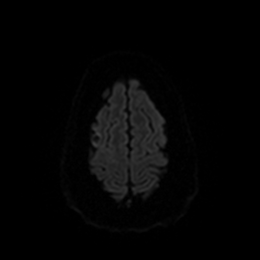
[im 92/92]
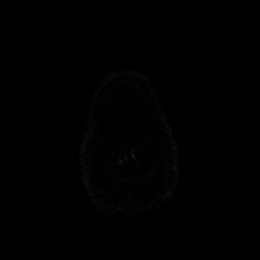

[Series 6: DWI · axial · 3.0mm · 0.88mm/px · z∈[-145,-14]mm · 5 of 46 slices shown (2 of 4)]
[im 1/46]
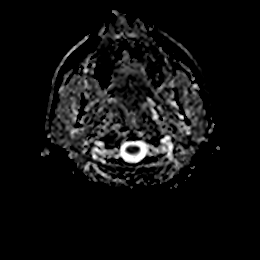
[im 12/46]
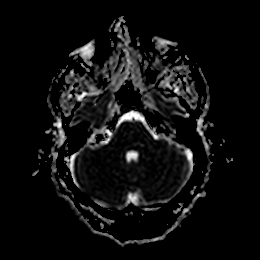
[im 23/46]
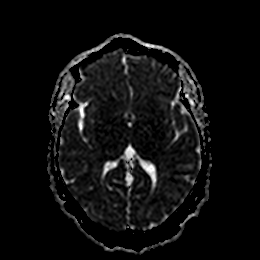
[im 34/46]
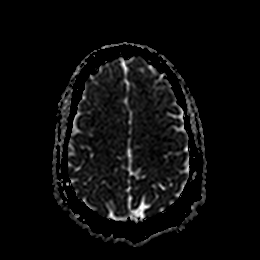
[im 46/46]
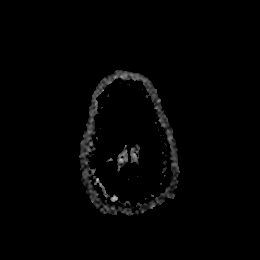

[Series 7: DWI · coronal · 4.0mm · 0.88mm/px · 7 of 68 slices shown (3 of 4)]
[im 1/68]
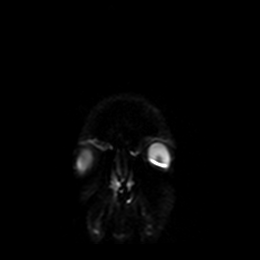
[im 12/68]
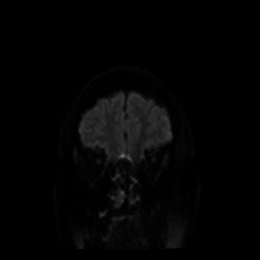
[im 23/68]
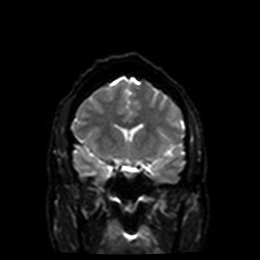
[im 34/68]
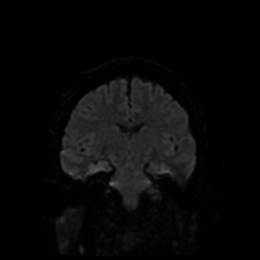
[im 45/68]
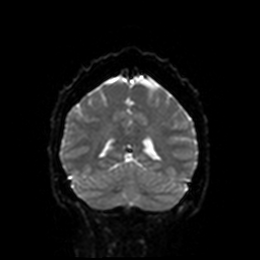
[im 56/68]
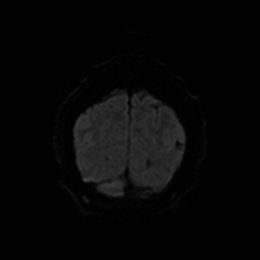
[im 68/68]
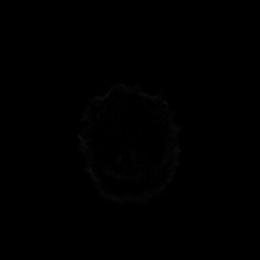

[Series 8: DWI · coronal · 4.0mm · 0.88mm/px · 3 of 34 slices shown (4 of 4)]
[im 1/34]
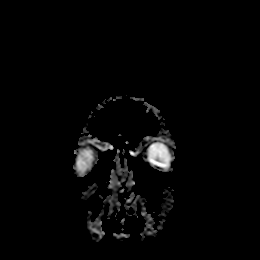
[im 17/34]
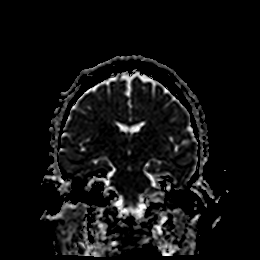
[im 34/34]
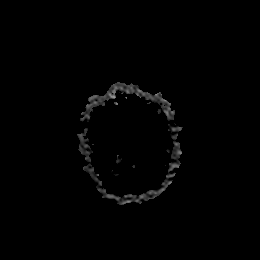

[Series 9: T1 · sagittal · 5.0mm · 0.75mm/px · 2 of 25 slices shown]
[im 1/25]
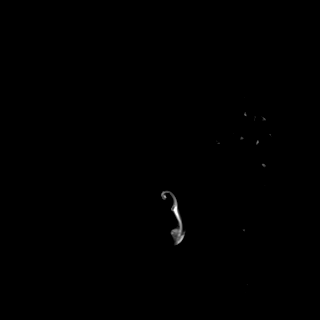
[im 25/25]
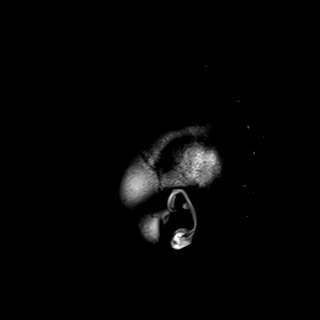

[Series 10: T2 · axial · 5.0mm · 0.72mm/px · z∈[-149,-9]mm · 2 of 25 slices shown (1 of 2)]
[im 1/25]
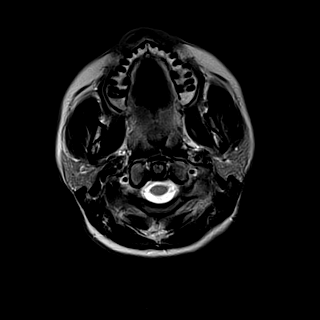
[im 25/25]
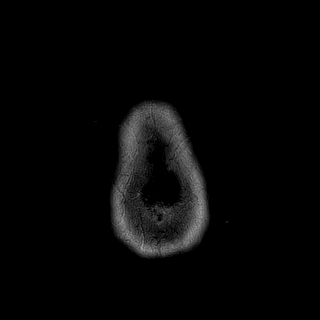

[Series 11: FLAIR · axial · 5.0mm · 0.45mm/px · z∈[-151,-11]mm · 2 of 25 slices shown]
[im 1/25]
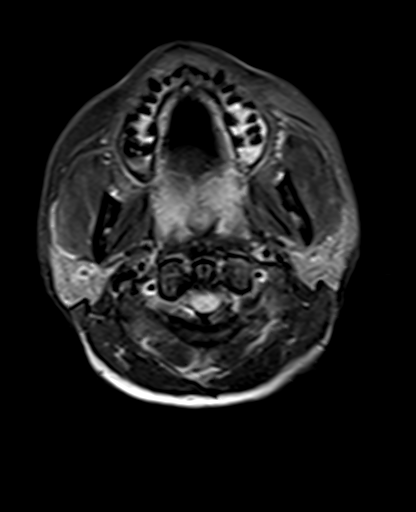
[im 25/25]
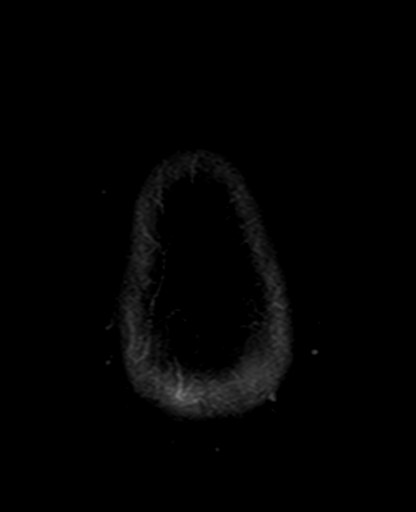

[Series 13: pha_images · axial · 3.0mm · 0.90mm/px · z∈[-163,+8]mm · 5 of 60 slices shown]
[im 1/60]
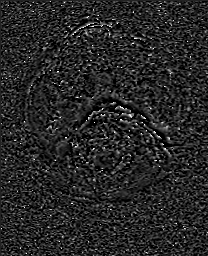
[im 15/60]
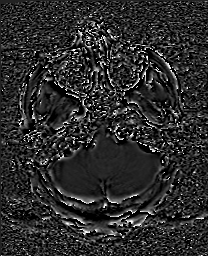
[im 30/60]
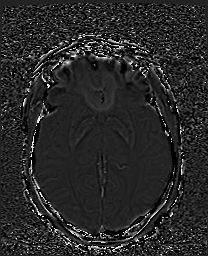
[im 45/60]
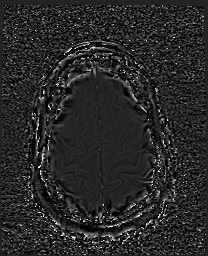
[im 60/60]
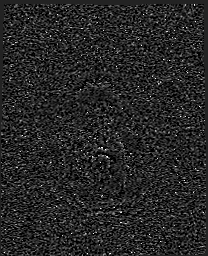

[Series 14: swi_images · axial · 3.0mm · 0.90mm/px · z∈[-163,+8]mm · 5 of 60 slices shown]
[im 1/60]
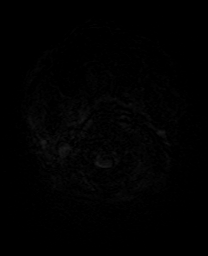
[im 15/60]
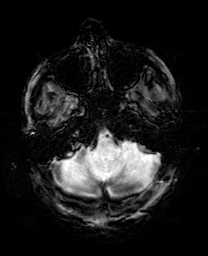
[im 30/60]
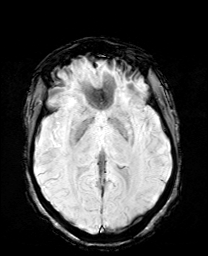
[im 45/60]
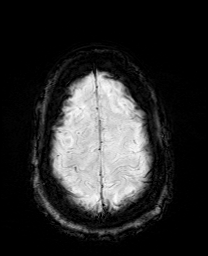
[im 60/60]
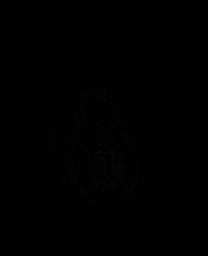

[Series 17: T2 · coronal · 5.0mm · 0.34mm/px · 3 of 29 slices shown (2 of 2)]
[im 1/29]
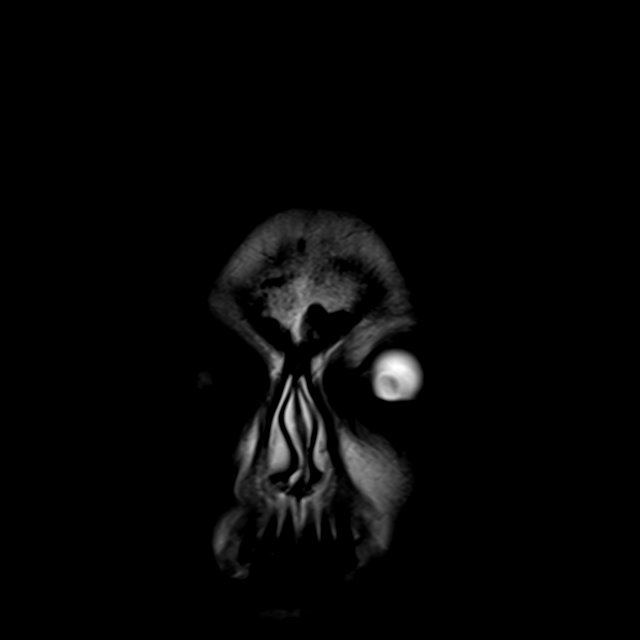
[im 15/29]
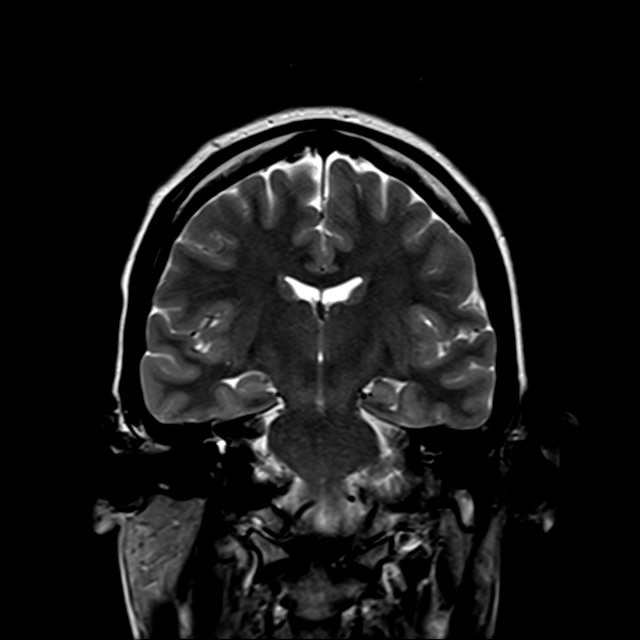
[im 29/29]
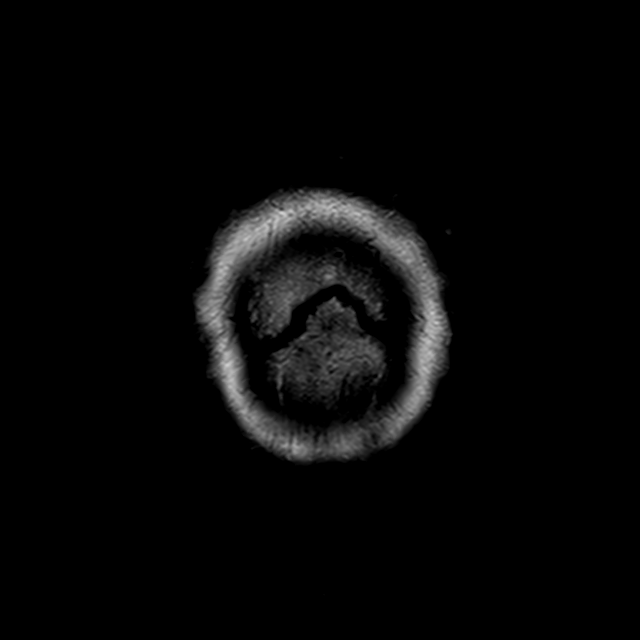

[43 of 48 positions shown; findings below may reference images not displayed]

FINDINGS: Brain: No acute infarction, hemorrhage, hydrocephalus, extra-axial
collection or mass lesion.

Vascular: Normal arterial flow voids

Skull and upper cervical spine: Negative

Sinuses/Orbits: Negative

Other: No evidence of intracranial hypotension.
IMPRESSION: Normal MRI head

## 2021-10-24 ENCOUNTER — Other Ambulatory Visit: Payer: Self-pay

## 2021-10-24 ENCOUNTER — Encounter (HOSPITAL_COMMUNITY): Payer: Self-pay | Admitting: Emergency Medicine

## 2021-10-24 ENCOUNTER — Emergency Department (HOSPITAL_COMMUNITY)
Admission: EM | Admit: 2021-10-24 | Discharge: 2021-10-25 | Disposition: A | Discharge status: Home / Self Care | Payer: No Typology Code available for payment source | Attending: Emergency Medicine | Admitting: Emergency Medicine

## 2021-10-24 DIAGNOSIS — K529 Noninfective gastroenteritis and colitis, unspecified: Secondary | ICD-10-CM | POA: Diagnosis not present

## 2021-10-24 DIAGNOSIS — R300 Dysuria: Secondary | ICD-10-CM | POA: Diagnosis not present

## 2021-10-24 DIAGNOSIS — R197 Diarrhea, unspecified: Secondary | ICD-10-CM | POA: Diagnosis present

## 2021-10-24 LAB — COMPREHENSIVE METABOLIC PANEL
ALT: 16 U/L (ref 0–44)
AST: 14 U/L — ABNORMAL LOW (ref 15–41)
Albumin: 4.1 g/dL (ref 3.5–5.0)
Alkaline Phosphatase: 61 U/L (ref 38–126)
Anion gap: 5 (ref 5–15)
BUN: 19 mg/dL (ref 6–20)
CO2: 19 mmol/L — ABNORMAL LOW (ref 22–32)
Calcium: 9 mg/dL (ref 8.9–10.3)
Chloride: 117 mmol/L — ABNORMAL HIGH (ref 98–111)
Creatinine, Ser: 1 mg/dL (ref 0.44–1.00)
GFR, Estimated: >60 mL/min (ref >60)
Glucose, Bld: 98 mg/dL (ref 70–99)
Potassium: 3.3 mmol/L — ABNORMAL LOW (ref 3.5–5.1)
Sodium: 141 mmol/L (ref 135–145)
Total Bilirubin: 0.5 mg/dL (ref 0.3–1.2)
Total Protein: 6.7 g/dL (ref 6.5–8.1)

## 2021-10-24 LAB — CBC WITH DIFFERENTIAL/PLATELET
Abs Immature Granulocytes: 0.02 10*3/uL (ref 0.00–0.07)
Basophils Absolute: 0 10*3/uL (ref 0.0–0.1)
Basophils Relative: 1 %
Eosinophils Absolute: 0.2 10*3/uL (ref 0.0–0.5)
Eosinophils Relative: 3 %
HCT: 41 % (ref 36.0–46.0)
Hemoglobin: 13.3 g/dL (ref 12.0–15.0)
Immature Granulocytes: 0 %
Lymphocytes Relative: 34 %
Lymphs Abs: 2.2 10*3/uL (ref 0.7–4.0)
MCH: 31 pg (ref 26.0–34.0)
MCHC: 32.4 g/dL (ref 30.0–36.0)
MCV: 95.6 fL (ref 80.0–100.0)
Monocytes Absolute: 0.4 10*3/uL (ref 0.1–1.0)
Monocytes Relative: 7 %
Neutro Abs: 3.4 10*3/uL (ref 1.7–7.7)
Neutrophils Relative %: 55 %
Platelets: 246 10*3/uL (ref 150–400)
RBC: 4.29 MIL/uL (ref 3.87–5.11)
RDW: 14 % (ref 11.5–15.5)
WBC: 6.3 10*3/uL (ref 4.0–10.5)
nRBC: 0 % (ref 0.0–0.2)

## 2021-10-24 LAB — URINALYSIS, ROUTINE W REFLEX MICROSCOPIC
Bacteria, UA: NONE SEEN
Bilirubin Urine: NEGATIVE
Glucose, UA: NEGATIVE mg/dL
Hgb urine dipstick: NEGATIVE
Ketones, ur: NEGATIVE mg/dL
Nitrite: NEGATIVE
Protein, ur: NEGATIVE mg/dL
Specific Gravity, Urine: 1.024 (ref 1.005–1.030)
pH: 6 (ref 5.0–8.0)

## 2021-10-24 LAB — I-STAT BETA HCG BLOOD, ED (MC, WL, AP ONLY): I-stat hCG, quantitative: <5 m[IU]/mL (ref <5)

## 2021-10-24 LAB — LIPASE, BLOOD: Lipase: 54 U/L — ABNORMAL HIGH (ref 11–51)

## 2021-10-24 NOTE — ED Notes (Signed)
Pt needs IV for CT  

## 2021-10-24 NOTE — ED Notes (Signed)
Still waiting for IV thanks

## 2021-10-24 NOTE — ED Provider Triage Note (Signed)
Emergency Medicine Provider Triage Evaluation Note  [name] , a 41 y.o. female  was evaluated in triage.  Pt complains of abdominal pain, nausea, fevers, and diarrhea over the last 2 days.  Pain in the left lower abdomen, without radiation.  Also endorses decreased urinary output, feels as though she's unable to expel all her urine when she urinates.  Highest fever between 99-100.  Denies vomiting, neck stiffness, or headache.  Diarrhea described as brown, yellow, and nonbloody.  No Hx of kidney stones.  Also endorses difficulty sleeping over the past few days.  Review of Systems  Positive:  Negative: See above  Physical Exam  BP (!) 144/87 (BP Location: Right Arm)   Pulse 67   Temp 98.5 F (36.9 C) (Oral)   Resp 18   Ht '5\' 6"'$  (1.676 m)   Wt 77.6 kg   SpO2 100%   BMI 27.60 kg/m  Gen:   Awake, no distress, appears clinically fatigued Resp:  Normal effort  MSK:   Moves extremities without difficulty  Other:  Abdomen soft, LLQ tenderness.  No flank tenderness.  Medical Decision Making  Medically screening exam initiated at 9:02 PM.  Appropriate orders placed.  Oveta Idris was informed that the remainder of the evaluation will be completed by another provider, this initial triage assessment does not replace that evaluation, and the importance of remaining in the ED until their evaluation is complete.     Prince Rome, PA-C 16/96/78 [id number]

## 2021-10-24 NOTE — ED Triage Notes (Addendum)
Pt reports dysuria, L generalized abdominal pain, and diarrhea for the last 2 days. Also reports fatigue and chills. Denies sick contacts, hematuria, or vomiting.

## 2021-10-25 ENCOUNTER — Emergency Department (HOSPITAL_COMMUNITY): Payer: No Typology Code available for payment source

## 2021-10-25 MED ORDER — IOHEXOL 300 MG/ML  SOLN
100.0000 mL | Freq: Once | INTRAMUSCULAR | Status: AC | PRN
  Administered 2021-10-25: 100 mL via INTRAVENOUS

## 2021-10-25 NOTE — ED Provider Notes (Signed)
[facility] DEPT  Provider Note  CSN: [id number] Arrival date & time: 10/24/21 2000  History Chief Complaint  Patient presents with   Diarrhea    Barry Culverhouse is a 41 y.o. female reports she had some urinary urgency and hesitancy 2 days ago. Began having watery diarrhea and LLQ abdominal cramping yesterday. Low grade fever at home. No vomiting. No blood in stools. No known sick contacts.    Home Medications Prior to Admission medications   Medication Sig Start Date End Date Taking? Authorizing Provider  acetaminophen (TYLENOL) 650 MG CR tablet Take 650-1,300 mg by mouth 2 (two) times daily as needed for pain.    [provider]  acetaZOLAMIDE (DIAMOX) 250 MG tablet Take 4 tablets (1,000 mg total) by mouth daily. 09/13/21   Lomax, Amy, NP  albuterol (VENTOLIN HFA) 108 (90 Base) MCG/ACT inhaler Inhale 1-2 puffs into the lungs every 6 (six) hours as needed for wheezing or shortness of breath. 01/02/21   Suzy Bouchard, PA-C  cetirizine (ZYRTEC) 10 MG tablet Take 10 mg by mouth daily as needed for allergies or rhinitis.    [provider]  dicyclomine (BENTYL) 10 MG capsule Take 20 mg by mouth 3 (three) times daily. 04/14/21   [provider]  gabapentin (NEURONTIN) 100 MG capsule Take 2 capsules (200 mg total) by mouth at bedtime. Patient taking differently: Take 200 mg by mouth daily. 09/13/20   Lomax, Amy, NP  levonorgestrel (MIRENA) 20 MCG/24HR IUD 1 each by Intrauterine route once.    [provider]  loratadine (CLARITIN) 10 MG tablet Take 10 mg by mouth daily as needed for allergies or rhinitis.    [provider]  ondansetron (ZOFRAN) 4 MG tablet Take 1 tablet (4 mg total) by mouth every 8 (eight) hours as needed for nausea or vomiting. 12/03/18   Lomax, Amy, NP  pantoprazole (PROTONIX) 40 MG tablet Take 40 mg by mouth every morning. 03/28/21   [provider]  Rimegepant Sulfate (NURTEC) 75 MG TBDP  Take 75 mg by mouth every other day. 09/13/21   Lomax, Amy, NP  topiramate (TOPAMAX) 100 MG tablet Take 1 tablet (100 mg total) by mouth at bedtime. 09/13/21   Lomax, Amy, NP  valACYclovir (VALTREX) 500 MG tablet Take 500 mg by mouth 2 (two) times daily as needed (as needed for lesions).    [provider]     Allergies    Amoxicillin, Aspirin, Ibuprofen, Adhesive [tape], and Venlafaxine hcl   Review of Systems   Review of Systems Please see HPI for pertinent positives and negatives  Physical Exam BP 117/62   Pulse (!) 50   Temp 98.5 F (36.9 C) (Oral)   Resp 18   Ht '5\' 6"'$  (1.676 m)   Wt 77.6 kg   SpO2 100%   BMI 27.60 kg/m   Physical Exam Vitals and nursing note reviewed.  Constitutional:      Appearance: Normal appearance.  HENT:     Head: Normocephalic and atraumatic.     Nose: Nose normal.     Mouth/Throat:     Mouth: Mucous membranes are moist.  Eyes:     Extraocular Movements: Extraocular movements intact.     Conjunctiva/sclera: Conjunctivae normal.  Cardiovascular:     Rate and Rhythm: Normal rate.  Pulmonary:     Effort: Pulmonary effort is normal.     Breath sounds: Normal breath sounds.  Abdominal:     General: Abdomen is flat.  Palpations: Abdomen is soft.     Tenderness: There is abdominal tenderness (LLQ). There is no guarding or rebound.  Musculoskeletal:        General: No swelling. Normal range of motion.     Cervical back: Neck supple.  Skin:    General: Skin is warm and dry.  Neurological:     General: No focal deficit present.     Mental Status: She is alert.  Psychiatric:        Mood and Affect: Mood normal.     ED Results / Procedures / Treatments   EKG None  Procedures Procedures  Medications Ordered in the ED Medications  iohexol (OMNIPAQUE) 300 MG/ML solution 100 mL (100 mLs Intravenous Contrast Given 10/25/21 0038)    Initial Impression and Plan  Patient here with LLQ abd pain, diarrhea and some nonspecific  urinary symptoms. Labs done in triage show normal CBC, CMP and lipase unremarkable. UA without convincing signs of infection. She was also sent for a CT which is pending, otherwise her abdominal exam is benign.   ED Course   Clinical Course as of 10/25/21 0145  Tue Oct 25, 2021  0143 I personally viewed the images from radiology studies and agree with radiologist interpretation: CT shows likely viral gastroenteritis. Given her urinary symptoms, culture was sent. Advised to take OTC antidiarrheals. Urology follow up if urinary symptoms persist. PCP follouwp or RTED if not improving.   [CS]    Clinical Course User Index [CS] Truddie Hidden, MD     MDM Rules/Calculators/A&P Medical Decision Making Problems Addressed: Dysuria: acute illness or injury Gastroenteritis: acute illness or injury  Amount and/or Complexity of Data Reviewed Labs: ordered. Decision-making details documented in ED Course. Radiology: ordered and independent interpretation performed. Decision-making details documented in ED Course.  Risk OTC drugs. Prescription drug management.    Final Clinical Impression(s) / ED Diagnoses Final diagnoses:  Gastroenteritis  Dysuria    Rx / DC Orders ED Discharge Orders     None        Truddie Hidden, MD 10/25/21 0145

## 2021-10-26 LAB — URINE CULTURE: Culture: NO GROWTH

## 2021-11-10 ENCOUNTER — Encounter: Payer: Self-pay | Admitting: Family Medicine

## 2021-11-14 ENCOUNTER — Emergency Department (HOSPITAL_COMMUNITY): Payer: No Typology Code available for payment source

## 2021-11-14 ENCOUNTER — Emergency Department (HOSPITAL_COMMUNITY)
Admission: EM | Admit: 2021-11-14 | Discharge: 2021-11-14 | Disposition: A | Discharge status: Home / Self Care | Payer: No Typology Code available for payment source | Attending: Emergency Medicine | Admitting: Emergency Medicine

## 2021-11-14 DIAGNOSIS — R1031 Right lower quadrant pain: Secondary | ICD-10-CM

## 2021-11-14 DIAGNOSIS — R110 Nausea: Secondary | ICD-10-CM | POA: Diagnosis not present

## 2021-11-14 DIAGNOSIS — R748 Abnormal levels of other serum enzymes: Secondary | ICD-10-CM | POA: Insufficient documentation

## 2021-11-14 DIAGNOSIS — N898 Other specified noninflammatory disorders of vagina: Secondary | ICD-10-CM | POA: Diagnosis not present

## 2021-11-14 LAB — CBC WITH DIFFERENTIAL/PLATELET
Abs Immature Granulocytes: 0.01 10*3/uL (ref 0.00–0.07)
Basophils Absolute: 0.1 10*3/uL (ref 0.0–0.1)
Basophils Relative: 1 %
Eosinophils Absolute: 0.2 10*3/uL (ref 0.0–0.5)
Eosinophils Relative: 5 %
HCT: 39.9 % (ref 36.0–46.0)
Hemoglobin: 12.5 g/dL (ref 12.0–15.0)
Immature Granulocytes: 0 %
Lymphocytes Relative: 33 %
Lymphs Abs: 1.5 10*3/uL (ref 0.7–4.0)
MCH: 30.5 pg (ref 26.0–34.0)
MCHC: 31.3 g/dL (ref 30.0–36.0)
MCV: 97.3 fL (ref 80.0–100.0)
Monocytes Absolute: 0.3 10*3/uL (ref 0.1–1.0)
Monocytes Relative: 7 %
Neutro Abs: 2.5 10*3/uL (ref 1.7–7.7)
Neutrophils Relative %: 54 %
Platelets: 239 10*3/uL (ref 150–400)
RBC: 4.1 MIL/uL (ref 3.87–5.11)
RDW: 13.6 % (ref 11.5–15.5)
WBC: 4.6 10*3/uL (ref 4.0–10.5)
nRBC: 0 % (ref 0.0–0.2)

## 2021-11-14 LAB — COMPREHENSIVE METABOLIC PANEL
ALT: 17 U/L (ref 0–44)
AST: 16 U/L (ref 15–41)
Albumin: 3.9 g/dL (ref 3.5–5.0)
Alkaline Phosphatase: 53 U/L (ref 38–126)
Anion gap: 3 — ABNORMAL LOW (ref 5–15)
BUN: 17 mg/dL (ref 6–20)
CO2: 23 mmol/L (ref 22–32)
Calcium: 8.7 mg/dL — ABNORMAL LOW (ref 8.9–10.3)
Chloride: 116 mmol/L — ABNORMAL HIGH (ref 98–111)
Creatinine, Ser: 1.01 mg/dL — ABNORMAL HIGH (ref 0.44–1.00)
GFR, Estimated: >60 mL/min (ref >60)
Glucose, Bld: 102 mg/dL — ABNORMAL HIGH (ref 70–99)
Potassium: 3.9 mmol/L (ref 3.5–5.1)
Sodium: 142 mmol/L (ref 135–145)
Total Bilirubin: 0.4 mg/dL (ref 0.3–1.2)
Total Protein: 6.5 g/dL (ref 6.5–8.1)

## 2021-11-14 LAB — URINALYSIS, ROUTINE W REFLEX MICROSCOPIC
Bilirubin Urine: NEGATIVE
Glucose, UA: NEGATIVE mg/dL
Hgb urine dipstick: NEGATIVE
Ketones, ur: NEGATIVE mg/dL
Leukocytes,Ua: NEGATIVE
Nitrite: NEGATIVE
Protein, ur: NEGATIVE mg/dL
Specific Gravity, Urine: 1.015 (ref 1.005–1.030)
pH: 7 (ref 5.0–8.0)

## 2021-11-14 LAB — WET PREP, GENITAL
Clue Cells Wet Prep HPF POC: NONE SEEN
Sperm: NONE SEEN
Trich, Wet Prep: NONE SEEN
WBC, Wet Prep HPF POC: >=10 — AB (ref <10)
Yeast Wet Prep HPF POC: NONE SEEN

## 2021-11-14 LAB — LIPASE, BLOOD: Lipase: 57 U/L — ABNORMAL HIGH (ref 11–51)

## 2021-11-14 LAB — PREGNANCY, URINE: Preg Test, Ur: NEGATIVE

## 2021-11-14 LAB — HIV ANTIBODY (ROUTINE TESTING W REFLEX): HIV Screen 4th Generation wRfx: NONREACTIVE

## 2021-11-14 MED ORDER — ONDANSETRON HCL 4 MG/2ML IJ SOLN
4.0000 mg | Freq: Once | INTRAMUSCULAR | Status: AC
  Administered 2021-11-14: 4 mg via INTRAVENOUS
  Filled 2021-11-14: qty 2

## 2021-11-14 MED ORDER — HYDROMORPHONE HCL 1 MG/ML IJ SOLN
1.0000 mg | Freq: Once | INTRAMUSCULAR | Status: AC
  Administered 2021-11-14: 1 mg via INTRAVENOUS
  Filled 2021-11-14: qty 1

## 2021-11-14 MED ORDER — ACETAMINOPHEN 500 MG PO TABS: 500.0000 mg | ORAL_TABLET | Freq: Four times a day (QID) | ORAL | 0 refills | Status: AC | PRN

## 2021-11-14 MED ORDER — MORPHINE SULFATE (PF) 4 MG/ML IV SOLN
4.0000 mg | Freq: Once | INTRAVENOUS | Status: AC
  Administered 2021-11-14: 4 mg via INTRAVENOUS
  Filled 2021-11-14: qty 1

## 2021-11-14 MED ORDER — IOHEXOL 300 MG/ML  SOLN
100.0000 mL | Freq: Once | INTRAMUSCULAR | Status: AC | PRN
  Administered 2021-11-14: 100 mL via INTRAVENOUS

## 2021-11-14 MED ORDER — DICYCLOMINE HCL 10 MG PO CAPS: 10.0000 mg | ORAL_CAPSULE | Freq: Three times a day (TID) | ORAL | 0 refills | Status: DC

## 2021-11-14 NOTE — ED Notes (Signed)
Patient states she was recently diagnosed with BV and a yeast infection , states she has not been taking the meds prescribed because her pharmacy did not have the meds. Patient also sees a urologist for acute urinary retention . Denies suprapubic pain at the moment . Abdomen is round and non -distended . Patient medicated as ordered . Pending CT scan .

## 2021-11-14 NOTE — ED Triage Notes (Addendum)
RLQ pain h/x of kidney stones , complains of nausea , denies vomiting or diarrhea.

## 2021-11-14 NOTE — ED Notes (Signed)
Patient transported to CT 

## 2021-11-14 NOTE — ED Provider Notes (Signed)
[facility] DEPT Provider Note   CSN: [id number] Arrival date & time: 11/14/21  8127     History No chief complaint on file.   [name] is a 41 y.o. female with a past medical history of nephrolithiasis and GERD presenting with abdominal pain.  She reports that she has been having burning for the past few days but today she was walking around her house and had a sharp right lower quadrant pain.  She says that it was still painful but she had to sit down.  Associated nausea but no vomiting or diarrhea.  Of note patient was seen by her urologist on Thursday and he said that she had a lot of vaginal discharge and started her on Flagyl and Diflucan.  She reports that no swabs were performed during this visit.  She started the metronidazole on Saturday and has not yet started the Diflucan.  No history of abdominal surgery.  No urinary symptoms.  Last menstrual period September 22  HPI     Home Medications Prior to Admission medications   Medication Sig Start Date End Date Taking? Authorizing Provider  acetaminophen (TYLENOL) 650 MG CR tablet Take 650-1,300 mg by mouth 2 (two) times daily as needed for pain.    [provider]  acetaZOLAMIDE (DIAMOX) 250 MG tablet Take 4 tablets (1,000 mg total) by mouth daily. 09/13/21   Lomax, Amy, NP  albuterol (VENTOLIN HFA) 108 (90 Base) MCG/ACT inhaler Inhale 1-2 puffs into the lungs every 6 (six) hours as needed for wheezing or shortness of breath. 01/02/21   Suzy Bouchard, PA-C  cetirizine (ZYRTEC) 10 MG tablet Take 10 mg by mouth daily as needed for allergies or rhinitis.    [provider]  dicyclomine (BENTYL) 10 MG capsule Take 20 mg by mouth 3 (three) times daily. 04/14/21   [provider]  gabapentin (NEURONTIN) 100 MG capsule Take 2 capsules (200 mg total) by mouth at bedtime. Patient taking differently: Take 200 mg by mouth daily. 09/13/20   Lomax, Amy, NP  levonorgestrel (MIRENA)  20 MCG/24HR IUD 1 each by Intrauterine route once.    [provider]  loratadine (CLARITIN) 10 MG tablet Take 10 mg by mouth daily as needed for allergies or rhinitis.    [provider]  ondansetron (ZOFRAN) 4 MG tablet Take 1 tablet (4 mg total) by mouth every 8 (eight) hours as needed for nausea or vomiting. 12/03/18   Lomax, Amy, NP  pantoprazole (PROTONIX) 40 MG tablet Take 40 mg by mouth every morning. 03/28/21   [provider]  Rimegepant Sulfate (NURTEC) 75 MG TBDP Take 75 mg by mouth every other day. 09/13/21   Lomax, Amy, NP  topiramate (TOPAMAX) 100 MG tablet Take 1 tablet (100 mg total) by mouth at bedtime. 09/13/21   Lomax, Amy, NP  valACYclovir (VALTREX) 500 MG tablet Take 500 mg by mouth 2 (two) times daily as needed (as needed for lesions).    [provider]      Allergies    Amoxicillin, Aspirin, Ibuprofen, Adhesive [tape], and Venlafaxine hcl    Review of Systems   Review of Systems  Physical Exam Updated Vital Signs There were no vitals taken for this visit. Physical Exam Vitals and nursing note reviewed.  Constitutional:      Appearance: Normal appearance.  HENT:     Head: Normocephalic and atraumatic.  Eyes:     General: No scleral icterus.    Conjunctiva/sclera: Conjunctivae normal.  Pulmonary:     Effort: Pulmonary effort is normal. No respiratory distress.  Abdominal:     Tenderness: There is abdominal tenderness in the right lower quadrant.  Genitourinary:    Vagina: Vaginal discharge (Thick and gray, moderate amount) present. No tenderness.     Cervix: No cervical motion tenderness.     Uterus: Normal.      Adnexa: Right adnexa normal and left adnexa normal.     Comments: Pelvic exam performed in presence of female RN Skin:    Findings: No rash.  Neurological:     Mental Status: She is alert.  Psychiatric:        Mood and Affect: Mood normal.     ED Results / Procedures / Treatments   Labs (all labs ordered  are listed, but only abnormal results are displayed) Labs Reviewed  WET PREP, GENITAL - Abnormal; Notable for the following components:      Result Value   WBC, Wet Prep HPF POC >=10 (*)    All other components within normal limits  COMPREHENSIVE METABOLIC PANEL - Abnormal; Notable for the following components:   Chloride 116 (*)    Glucose, Bld 102 (*)    Creatinine, Ser 1.01 (*)    Calcium 8.7 (*)    Anion gap 3 (*)    All other components within normal limits  LIPASE, BLOOD - Abnormal; Notable for the following components:   Lipase 57 (*)    All other components within normal limits  URINALYSIS, ROUTINE W REFLEX MICROSCOPIC - Abnormal; Notable for the following components:   APPearance CLOUDY (*)    All other components within normal limits  CBC WITH DIFFERENTIAL/PLATELET  PREGNANCY, URINE  HIV ANTIBODY (ROUTINE TESTING W REFLEX)  GC/CHLAMYDIA PROBE AMP ([place]) NOT AT [facility]    EKG None  Radiology US Pelvis Complete  Result Date: 11/14/2021 CLINICAL DATA:  Left lower quadrant pain. EXAM: TRANSABDOMINAL AND TRANSVAGINAL ULTRASOUND OF PELVIS TECHNIQUE: Both transabdominal and transvaginal ultrasound examinations of the pelvis were performed. Transabdominal technique was performed for global imaging of the pelvis including uterus, ovaries, adnexal regions, and pelvic cul-de-sac. It was necessary to proceed with endovaginal exam following the transabdominal exam to visualize the adnexal structures. COMPARISON:  CT abdomen pelvis 11/14/2021 FINDINGS: Uterus Measurements: 10.7 x 5.4 x 6.2 cm = volume: 185.2 mL. Uterine fibroids are demonstrated including a 2.1 x 1.4 x 1.9 cm fibroid within the posterior uterine body and a 3.6 x 2.0 x 2.5 cm fibroid within the anterior uterine body. Endometrium Thickness: 7 mm.  Intrauterine device is present. Right ovary Measurements: 3.7 x 2.0 x 1.9 cm = volume: 7.2 mL. Normal appearance/no adnexal mass. Left ovary Measurements: 4.4 x 2.3 x 5.9 cm =  volume: 31.7 mL. Normal appearance/no adnexal mass. Other findings Small amount of adnexal free fluid. IMPRESSION: No acute process.  Intrauterine device is present. Fibroid uterus. Unremarkable appearance of the ovaries bilaterally. Electronically Signed   By: Lovey Newcomer M.D.   On: 11/14/2021 11:31   US Transvaginal Non-OB  Result Date: 11/14/2021 CLINICAL DATA:  Left lower quadrant pain. EXAM: TRANSABDOMINAL AND TRANSVAGINAL ULTRASOUND OF PELVIS TECHNIQUE: Both transabdominal and transvaginal ultrasound examinations of the pelvis were performed. Transabdominal technique was performed for global imaging of the pelvis including uterus, ovaries, adnexal regions, and pelvic cul-de-sac. It was necessary to proceed with endovaginal exam following the transabdominal exam to visualize the adnexal structures. COMPARISON:  CT abdomen pelvis 11/14/2021 FINDINGS: Uterus Measurements: 10.7 x 5.4 x 6.2  cm = volume: 185.2 mL. Uterine fibroids are demonstrated including a 2.1 x 1.4 x 1.9 cm fibroid within the posterior uterine body and a 3.6 x 2.0 x 2.5 cm fibroid within the anterior uterine body. Endometrium Thickness: 7 mm.  Intrauterine device is present. Right ovary Measurements: 3.7 x 2.0 x 1.9 cm = volume: 7.2 mL. Normal appearance/no adnexal mass. Left ovary Measurements: 4.4 x 2.3 x 5.9 cm = volume: 31.7 mL. Normal appearance/no adnexal mass. Other findings Small amount of adnexal free fluid. IMPRESSION: No acute process.  Intrauterine device is present. Fibroid uterus. Unremarkable appearance of the ovaries bilaterally. Electronically Signed   By: Lovey Newcomer M.D.   On: 11/14/2021 11:31   CT ABDOMEN PELVIS W CONTRAST  Result Date: 11/14/2021 CLINICAL DATA:  Acute abdominal pain. EXAM: CT ABDOMEN AND PELVIS WITH CONTRAST TECHNIQUE: Multidetector CT imaging of the abdomen and pelvis was performed using the standard protocol following bolus administration of intravenous contrast. RADIATION DOSE REDUCTION: This exam  was performed according to the departmental dose-optimization program which includes automated exposure control, adjustment of the mA and/or kV according to patient size and/or use of iterative reconstruction technique. CONTRAST:  173m OMNIPAQUE IOHEXOL 300 MG/ML  SOLN COMPARISON:  10/25/2021 FINDINGS: Lower chest: Lung bases demonstrate no focal airspace consolidation or effusion. Subtle focal atelectatic change right lower lobe. Airways are normal. Hepatobiliary: Stable peripheral 1.2 cm hypodensity over the anterior segment of the right lobe of the liver likely hemangioma. Gallbladder and biliary tree are normal. Pancreas: Normal. Spleen: Normal. Adrenals/Urinary Tract: Adrenal glands are normal. Kidneys are normal in size without evidence of hydronephrosis. There are a few small bilateral nonobstructing renal stones unchanged. Ureters and bladder are unremarkable. Stomach/Bowel: Stomach is somewhat contracted. Small bowel is normal. Appendix is normal. Moderate fecal retention throughout the colon. Vascular/Lymphatic: Abdominal aorta is normal caliber. No evidence of adenopathy. Reproductive: IUD in adequate position. Lower uterine segment somewhat ill-defined. Adnexal regions unremarkable. Possible small amount of pelvic fluid likely physiologic. Multiple pelvic phleboliths. Other: 1.1 cm hyperdense focus over the left anterior pelvis which may be within small bowel versus left ovary as this is unchanged and of uncertain clinical significance. This may represent a serosal fibroid versus hemorrhagic ovarian cyst. Musculoskeletal: No focal abnormality. IMPRESSION: 1. No acute findings in the abdomen/pelvis. 2. Bilateral nephrolithiasis without evidence of obstruction. 3. Stable 1.2 cm hypodensity over the anterior segment of the right lobe of the liver likely hemangioma. 4. 1.1 cm hyperdense focus over the left anterior pelvis which may be within small bowel versus left ovary as this is unchanged and of  uncertain clinical significance. Consider pelvic ultrasound for further evaluation with attention to the left ovary. Electronically Signed   By: DMarin OlpM.D.   On: 11/14/2021 08:58    Procedures Procedures   Medications Ordered in ED Medications  ondansetron (ZOFRAN) injection 4 mg (4 mg Intravenous Given 11/14/21 0659)  morphine (PF) 4 MG/ML injection 4 mg (4 mg Intravenous Given 11/14/21 0659)  iohexol (OMNIPAQUE) 300 MG/ML solution 100 mL (100 mLs Intravenous Contrast Given 11/14/21 0831)    ED Course/ Medical Decision Making/ A&P Clinical Course as of 11/14/21 1139  Mon Nov 14, 2021  0913 Patient agreeable to pelvic exam and pelvic ultrasound [MR]    Clinical Course User Index [MR] Aylah Yeary, MCecilio Asper PA-C                           Medical Decision Making Amount  and/or Complexity of Data Reviewed Labs: ordered. Radiology: ordered.  Risk Prescription drug management.   This is a 41 year old female who presents to the ED for concern of abdominal pain. The differential diagnosis for generalized abdominal pain includes, but is not limited to AAA, gastroenteritis, appendicitis, Bowel obstruction, Bowel perforation. Gastroparesis, DKA, Hernia, Inflammatory bowel disease, mesenteric ischemia, pancreatitis, peritonitis SBP, volvulus.    This is not an exhaustive differential.    Past Medical History / Co-morbidities / Social History: Started on antibiotics 2 days ago   Additional history: Per chart review patient presented for the same thing in March.  At that time she said it has been going on for about a year.  She has seen both GI and OB/GYN.  She had a negative work-up in March for this and a normal CT scan   Physical Exam: Pertinent physical exam findings include Right lower and right upper quadrant TTP  Lab Tests: I ordered, and personally interpreted labs.  The pertinent results include: Negative BV and yeast Normal UA Unremarkable labs   Imaging Studies: I  ordered and independently visualized and interpreted CT abdomen pelvis and I agree with the radiologist that the left ovarian area appears abnormal.  Will order pelvic ultrasound.  I viewed the patient's pelvic ultrasound and agree with radiology that is within normal limits     Medications: I ordered medication including morphine and Zofran. Reevaluation of the patient after these medicines showed that the patient improved.    MDM/Disposition: This is a 41 year old female who presented today with a few days worth of abdominal burning as well as right-sided abdominal pain this morning.  She did start Flagyl on Thursday.  Lab work today was largely benign.  Lipase was minimally elevated at 57 however CT showed no signs of pancreatitis.  Ultrasound was ordered after CT question abnormal ovarian area.  Ultrasound negative.  At this time I believe her symptoms are best followed with gastroenterology.  She is hemodynamically stable today.  No fever, tachycardia or WBC count.  Pelvic exam is with large amounts of thick discharge however yeast and bacterial vaginosis were negative.  She will see her STD results online.    After patient's work-up today, I feel that patient is stable for discharge home.  Considered admission for further work-up however with no fever, stable heart rate and normal WBC count I do not believe any further work-up is indicated inpatient.  I suspect her symptoms are likely secondary to antibiotic use.   Final Clinical Impression(s) / ED Diagnoses Final diagnoses:  Right lower quadrant abdominal pain    Rx / DC Orders ED Discharge Orders          Ordered    acetaminophen (TYLENOL) 500 MG tablet  Every 6 hours PRN        11/14/21 1142    dicyclomine (BENTYL) 10 MG capsule  3 times daily        11/14/21 1142           Results and diagnoses were explained to the patient. Return precautions discussed in full. Patient had no additional questions and expressed complete  understanding.   This chart was dictated using voice recognition software.  Despite best efforts to proofread,  errors can occur which can change the documentation meaning.    Rhae Hammock, PA-C 11/14/21 [place], MD 11/15/21 [phone]

## 2021-11-14 NOTE — Discharge Instructions (Addendum)
Your ultrasound is normal.  There is no problem with your ovaries.  Just so you know, your CT scan did show small bilateral kidney stones however these are nonobstructing and are not causing his symptoms at this time.  At this time it is necessary for you to follow-up with gastroenterology for your recurring abdominal discomfort.  You may use Tylenol and the dicloxacillin that I am sending to your pharmacy.  This is for cramping and sharp pains.  Return with any worsening symptoms, otherwise continue your antibiotics and see gastroenterology and your PCP.

## 2021-11-15 LAB — GC/CHLAMYDIA PROBE AMP (~~LOC~~) NOT AT ARMC
Chlamydia: NEGATIVE
Comment: NEGATIVE
Comment: NORMAL
Neisseria Gonorrhea: NEGATIVE

## 2021-11-16 ENCOUNTER — Telehealth: Payer: Self-pay | Admitting: *Deleted

## 2021-11-16 NOTE — Telephone Encounter (Signed)
"  Request Reference Number: [id number]. NURTEC TAB '75MG'$  ODT is approved through 11/17/2022. Your patient may now fill this prescription and it will be covered."

## 2021-11-16 NOTE — Telephone Encounter (Signed)
Submitted PA Nurtec on CMM. Key: [id number]. Waiting on determination from [facility]

## 2021-11-21 ENCOUNTER — Other Ambulatory Visit: Payer: Self-pay

## 2021-11-21 MED ORDER — GABAPENTIN 100 MG PO CAPS: 200.0000 mg | ORAL_CAPSULE | Freq: Every day | ORAL | 3 refills | Status: DC

## 2021-11-22 ENCOUNTER — Other Ambulatory Visit: Payer: Self-pay | Admitting: Gastroenterology

## 2021-11-22 ENCOUNTER — Other Ambulatory Visit (HOSPITAL_COMMUNITY): Payer: Self-pay | Admitting: Gastroenterology

## 2021-11-22 DIAGNOSIS — R1084 Generalized abdominal pain: Secondary | ICD-10-CM

## 2021-11-22 DIAGNOSIS — R112 Nausea with vomiting, unspecified: Secondary | ICD-10-CM

## 2021-12-05 ENCOUNTER — Encounter (HOSPITAL_COMMUNITY)
Admission: RE | Admit: 2021-12-05 | Discharge: 2021-12-05 | Disposition: A | Discharge status: Home / Self Care | Payer: No Typology Code available for payment source | Source: Ambulatory Visit | Attending: Gastroenterology | Admitting: Gastroenterology

## 2021-12-05 DIAGNOSIS — R112 Nausea with vomiting, unspecified: Secondary | ICD-10-CM | POA: Insufficient documentation

## 2021-12-05 DIAGNOSIS — R1084 Generalized abdominal pain: Secondary | ICD-10-CM | POA: Diagnosis present

## 2021-12-05 MED ORDER — TECHNETIUM TC 99M SULFUR COLLOID
1.9000 | Freq: Once | INTRAVENOUS | Status: AC | PRN
  Administered 2021-12-05: 1.9 via ORAL

## 2022-01-10 ENCOUNTER — Other Ambulatory Visit: Payer: Self-pay

## 2022-01-16 ENCOUNTER — Other Ambulatory Visit: Payer: Self-pay

## 2022-01-16 DIAGNOSIS — G43009 Migraine without aura, not intractable, without status migrainosus: Secondary | ICD-10-CM

## 2022-01-16 MED ORDER — NURTEC 75 MG PO TBDP: 75.0000 mg | ORAL_TABLET | ORAL | 3 refills | Status: DC

## 2022-03-05 IMAGING — DX DG CHEST 2V
2 series · 2 of 2 positions shown · non-contrast
Comparison: June 13, 2016

CLINICAL DATA: Chest pain

EXAM:
CHEST - 2 VIEW

[chest pa]
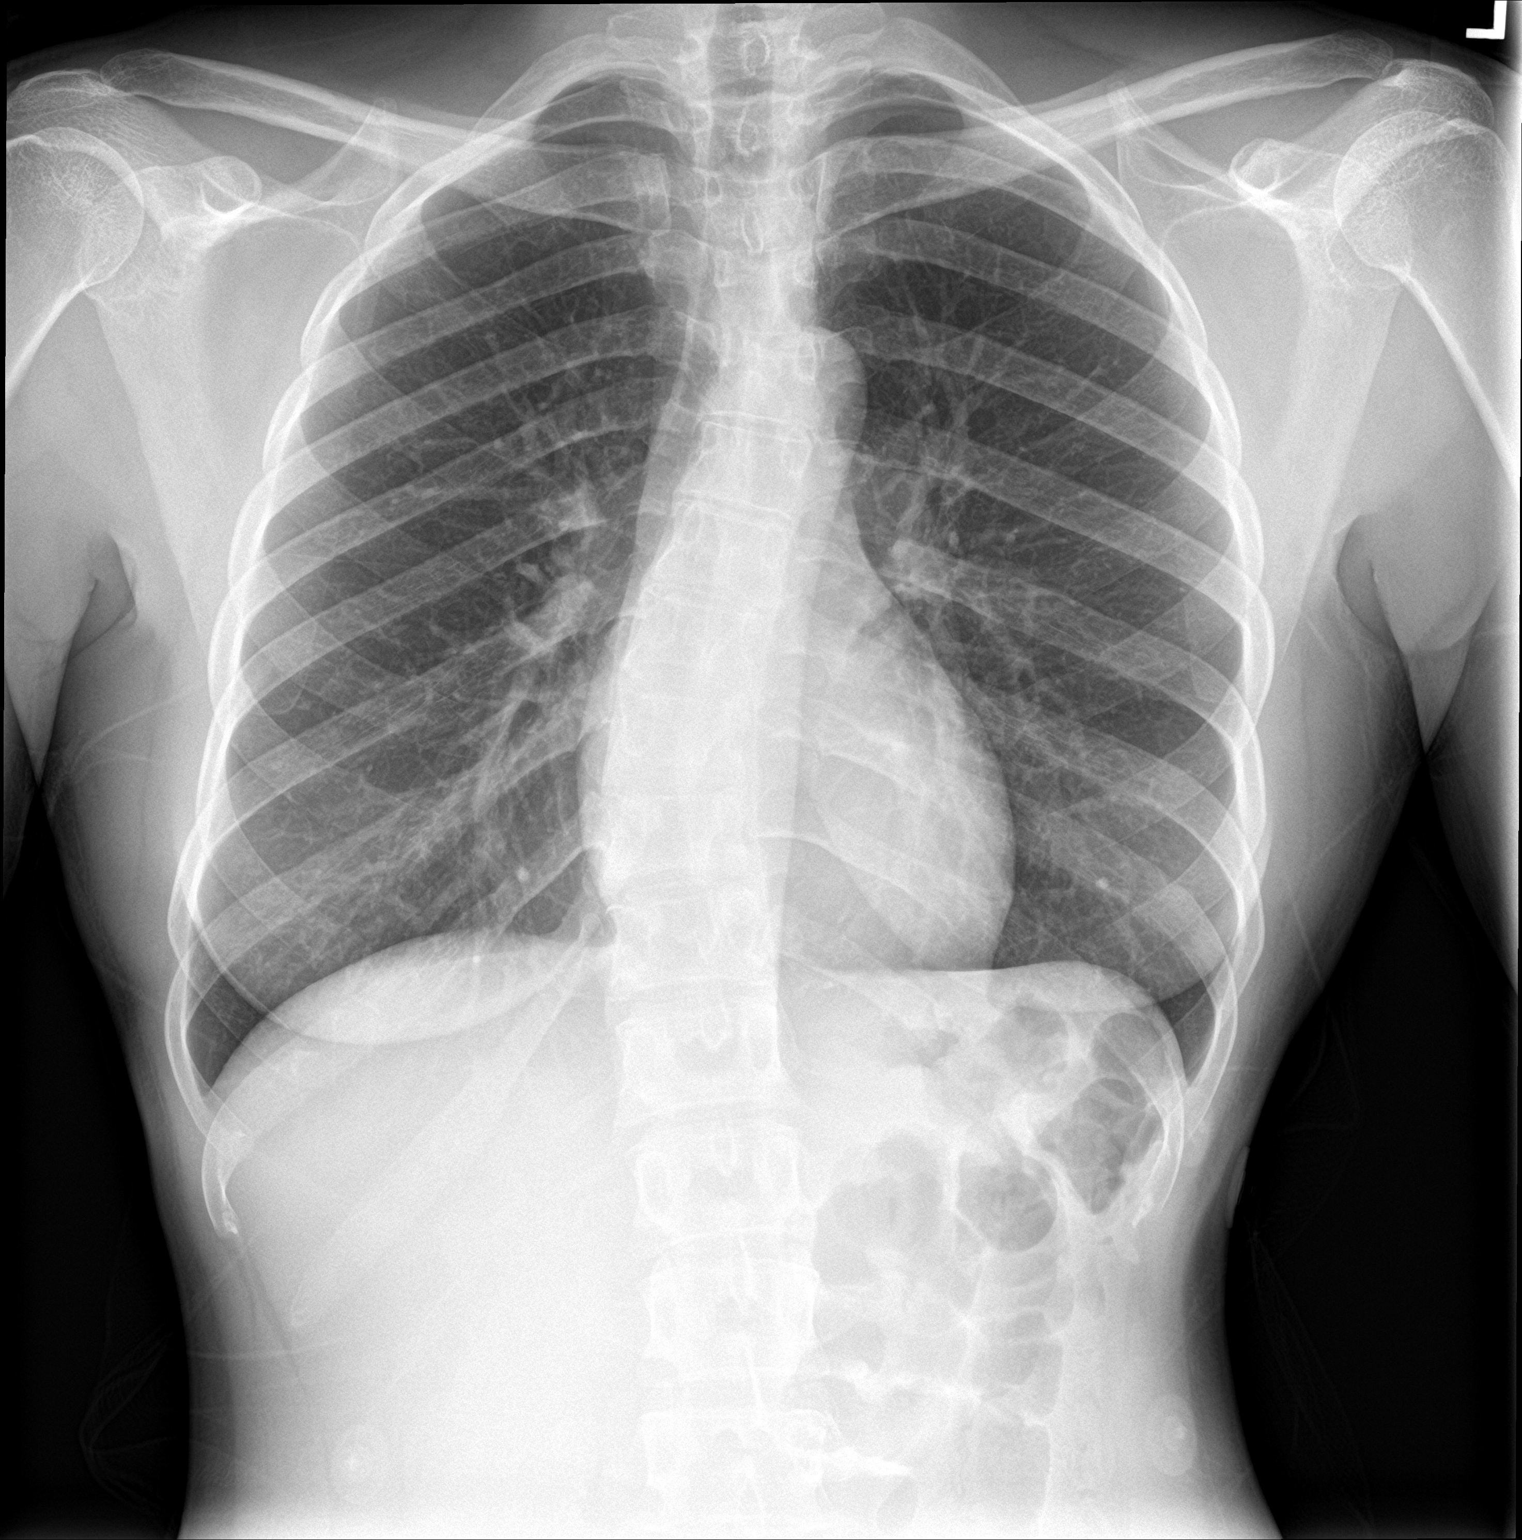

[chest lat]
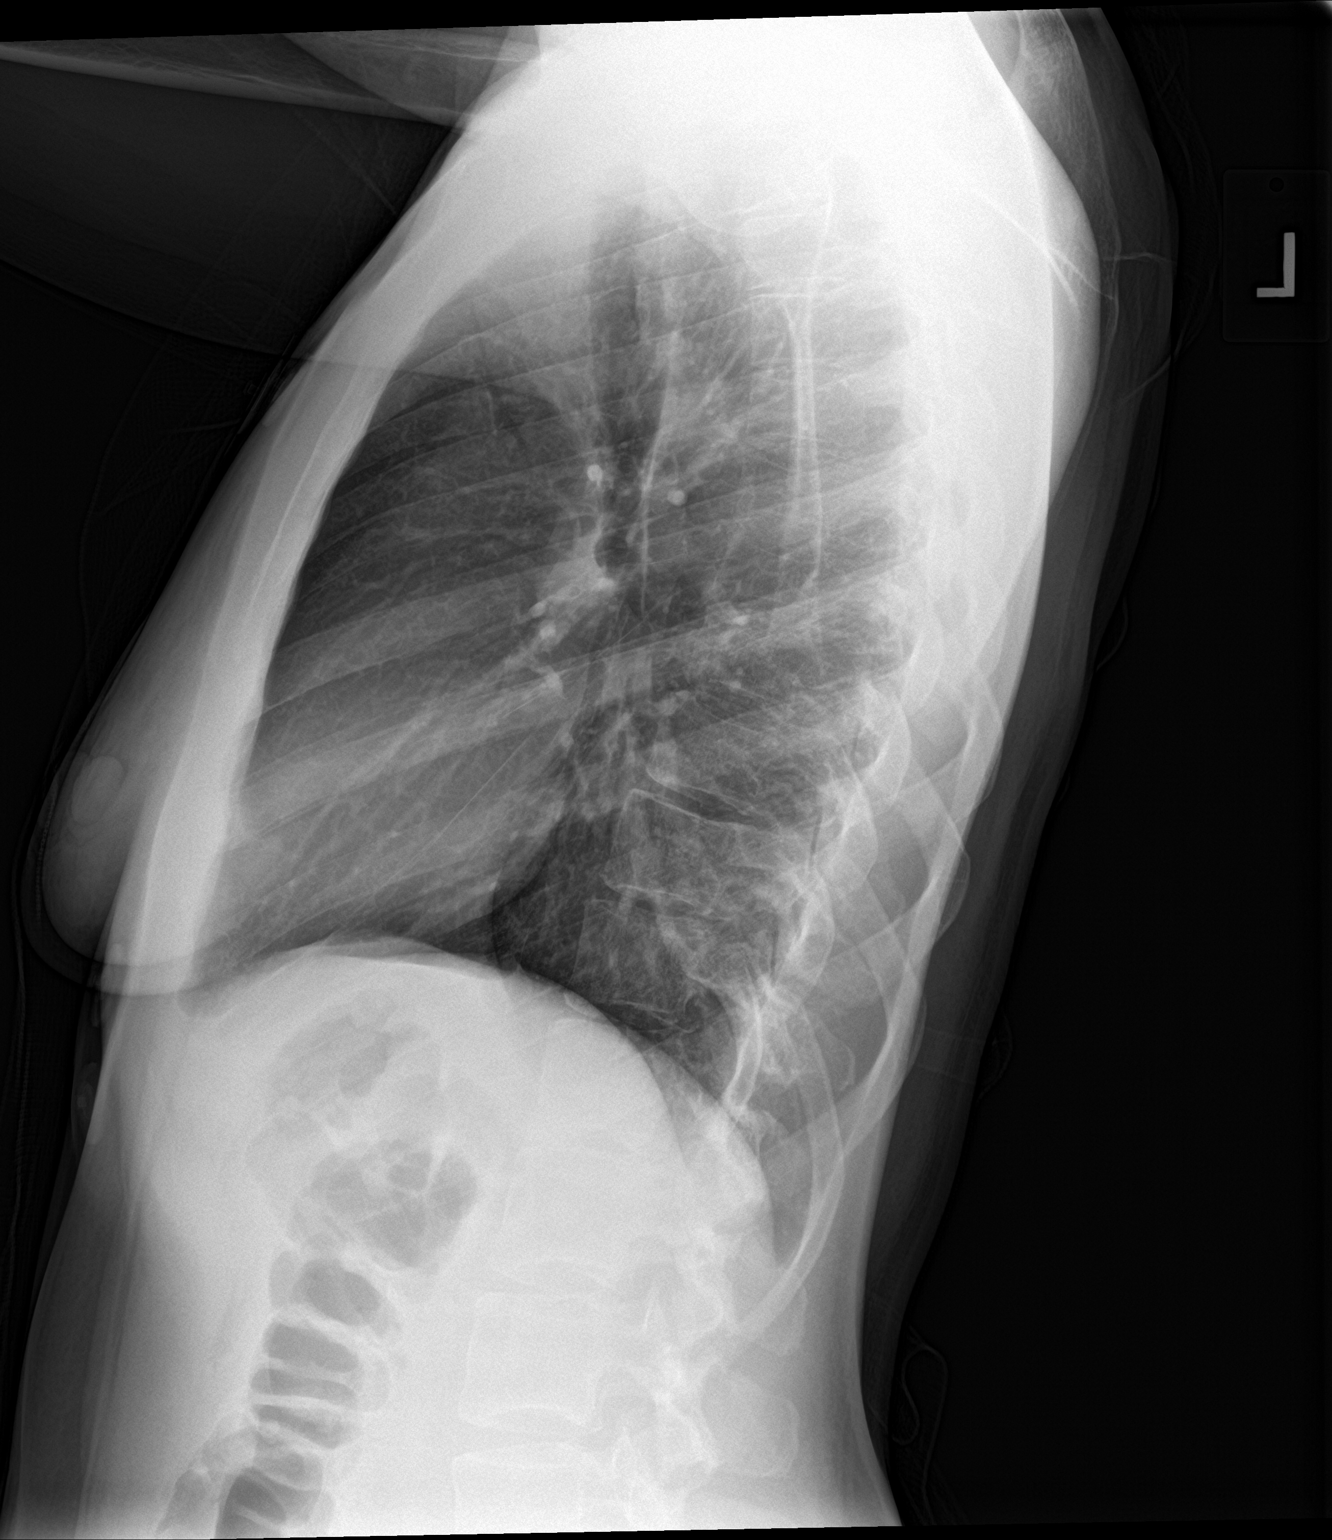

[2 of 2 positions shown; findings below may reference images not displayed]

FINDINGS: Lungs are clear. Heart size and pulmonary vascularity are normal. No
adenopathy. There is upper thoracic levoscoliosis with mid to lower
thoracic dextroscoliosis. No pneumothorax.
IMPRESSION: Lungs clear.  Heart size normal.  There is scoliosis.

## 2022-03-06 ENCOUNTER — Emergency Department (HOSPITAL_COMMUNITY): Payer: No Typology Code available for payment source

## 2022-03-06 ENCOUNTER — Encounter (HOSPITAL_COMMUNITY): Payer: Self-pay

## 2022-03-06 ENCOUNTER — Emergency Department (HOSPITAL_COMMUNITY)
Admission: EM | Admit: 2022-03-06 | Discharge: 2022-03-06 | Disposition: A | Discharge status: Home / Self Care | Payer: No Typology Code available for payment source | Attending: Emergency Medicine | Admitting: Emergency Medicine

## 2022-03-06 DIAGNOSIS — I10 Essential (primary) hypertension: Secondary | ICD-10-CM | POA: Insufficient documentation

## 2022-03-06 DIAGNOSIS — R1084 Generalized abdominal pain: Secondary | ICD-10-CM

## 2022-03-06 DIAGNOSIS — K581 Irritable bowel syndrome with constipation: Secondary | ICD-10-CM | POA: Diagnosis not present

## 2022-03-06 DIAGNOSIS — R109 Unspecified abdominal pain: Secondary | ICD-10-CM | POA: Diagnosis present

## 2022-03-06 DIAGNOSIS — J45909 Unspecified asthma, uncomplicated: Secondary | ICD-10-CM | POA: Insufficient documentation

## 2022-03-06 DIAGNOSIS — K2900 Acute gastritis without bleeding: Secondary | ICD-10-CM | POA: Diagnosis not present

## 2022-03-06 LAB — URINALYSIS, ROUTINE W REFLEX MICROSCOPIC
Bilirubin Urine: NEGATIVE
Glucose, UA: NEGATIVE mg/dL
Hgb urine dipstick: NEGATIVE
Ketones, ur: NEGATIVE mg/dL
Leukocytes,Ua: NEGATIVE
Nitrite: NEGATIVE
Protein, ur: NEGATIVE mg/dL
Specific Gravity, Urine: 1.018 (ref 1.005–1.030)
pH: 5 (ref 5.0–8.0)

## 2022-03-06 LAB — COMPREHENSIVE METABOLIC PANEL
ALT: 15 U/L (ref 0–44)
AST: 16 U/L (ref 15–41)
Albumin: 3.9 g/dL (ref 3.5–5.0)
Alkaline Phosphatase: 46 U/L (ref 38–126)
Anion gap: 6 (ref 5–15)
BUN: 10 mg/dL (ref 6–20)
CO2: 21 mmol/L — ABNORMAL LOW (ref 22–32)
Calcium: 8.8 mg/dL — ABNORMAL LOW (ref 8.9–10.3)
Chloride: 113 mmol/L — ABNORMAL HIGH (ref 98–111)
Creatinine, Ser: 0.9 mg/dL (ref 0.44–1.00)
GFR, Estimated: >60 mL/min (ref >60)
Glucose, Bld: 97 mg/dL (ref 70–99)
Potassium: 3.9 mmol/L (ref 3.5–5.1)
Sodium: 140 mmol/L (ref 135–145)
Total Bilirubin: 0.5 mg/dL (ref 0.3–1.2)
Total Protein: 6.2 g/dL — ABNORMAL LOW (ref 6.5–8.1)

## 2022-03-06 LAB — CBC WITH DIFFERENTIAL/PLATELET
Abs Immature Granulocytes: 0.02 10*3/uL (ref 0.00–0.07)
Basophils Absolute: 0.1 10*3/uL (ref 0.0–0.1)
Basophils Relative: 1 %
Eosinophils Absolute: 0.1 10*3/uL (ref 0.0–0.5)
Eosinophils Relative: 2 %
HCT: 41.9 % (ref 36.0–46.0)
Hemoglobin: 13.3 g/dL (ref 12.0–15.0)
Immature Granulocytes: 0 %
Lymphocytes Relative: 27 %
Lymphs Abs: 1.4 10*3/uL (ref 0.7–4.0)
MCH: 30.6 pg (ref 26.0–34.0)
MCHC: 31.7 g/dL (ref 30.0–36.0)
MCV: 96.3 fL (ref 80.0–100.0)
Monocytes Absolute: 0.2 10*3/uL (ref 0.1–1.0)
Monocytes Relative: 4 %
Neutro Abs: 3.4 10*3/uL (ref 1.7–7.7)
Neutrophils Relative %: 66 %
Platelets: 253 10*3/uL (ref 150–400)
RBC: 4.35 MIL/uL (ref 3.87–5.11)
RDW: 13.7 % (ref 11.5–15.5)
WBC: 5.2 10*3/uL (ref 4.0–10.5)
nRBC: 0 % (ref 0.0–0.2)

## 2022-03-06 LAB — LIPASE, BLOOD: Lipase: 42 U/L (ref 11–51)

## 2022-03-06 LAB — HCG, SERUM, QUALITATIVE: Preg, Serum: NEGATIVE

## 2022-03-06 MED ORDER — FLEET ENEMA 7-19 GM/118ML RE ENEM: 1.0000 | ENEMA | Freq: Once | RECTAL | 0 refills | Status: AC

## 2022-03-06 MED ORDER — IOHEXOL 300 MG/ML  SOLN
100.0000 mL | Freq: Once | INTRAMUSCULAR | Status: AC | PRN
  Administered 2022-03-06: 100 mL via INTRAVENOUS

## 2022-03-06 MED ORDER — ALUM & MAG HYDROXIDE-SIMETH 200-200-20 MG/5ML PO SUSP
30.0000 mL | Freq: Once | ORAL | Status: AC
  Administered 2022-03-06: 30 mL via ORAL
  Filled 2022-03-06: qty 30

## 2022-03-06 MED ORDER — HYDROCODONE-ACETAMINOPHEN 5-325 MG PO TABS
1.0000 | ORAL_TABLET | Freq: Once | ORAL | Status: AC
  Administered 2022-03-06: 1 via ORAL
  Filled 2022-03-06: qty 1

## 2022-03-06 MED ORDER — PANTOPRAZOLE SODIUM 20 MG PO TBEC: 20.0000 mg | DELAYED_RELEASE_TABLET | Freq: Every day | ORAL | 0 refills | Status: DC

## 2022-03-06 MED ORDER — PANTOPRAZOLE SODIUM 20 MG PO TBEC
20.0000 mg | DELAYED_RELEASE_TABLET | Freq: Once | ORAL | Status: AC
  Administered 2022-03-06: 20 mg via ORAL
  Filled 2022-03-06: qty 1

## 2022-03-06 NOTE — ED Triage Notes (Signed)
Pt presents with c/o abdominal pain and nausea for 2 days. Pt denies that there is any chance she could be pregnant. Pt denies vomiting, nausea only.

## 2022-03-06 NOTE — Discharge Instructions (Addendum)
Please return to the ED with any new or worsening signs or symptoms Please follow back up with PCP and GI for further management Please pick up Protonix and and your pharmacy Please pick up enema sent to your pharmacy Please read attached guide concerned gastritis

## 2022-03-06 NOTE — ED Notes (Signed)
Pt ambulatory without assistance.  

## 2022-03-06 NOTE — ED Provider Notes (Signed)
[facility] Provider Note   CSN: [id number] Arrival date & time: 03/06/22  1212     History  Chief Complaint  Patient presents with   Abdominal Pain    [name] is a 42 y.o. female with medical history of asthma, hypertension, migraines, PCOS, seizures.  Patient presents to ED for evaluation of abdominal pain.  Patient reports that the last 2 days she has had generalized abdominal pain nonfocal in nature.  Patient reports that she has not had a bowel movement in 2 days.  Patient goes on to describe that she has had issues moving her bowels over the past couple of months.  The patient is currently being seen by both PCP and GI with follow up scheduled for March.  Patient chart review shows patient has been diagnosed with IBS-C.  Patient denies nausea, vomiting.  Patient denies fevers, dysuria.  Patient denies chest pain or shortness of breath.   Abdominal Pain Associated symptoms: constipation   Associated symptoms: no chest pain, no dysuria, no fever, no nausea, no shortness of breath and no vomiting        Home Medications Prior to Admission medications   Medication Sig Start Date End Date Taking? Authorizing Provider  pantoprazole (PROTONIX) 20 MG tablet Take 1 tablet (20 mg total) by mouth daily. 03/06/22  Yes Azucena Cecil, PA-C  sodium phosphate (FLEET) 7-19 GM/118ML ENEM Place 133 mLs (1 enema total) rectally once for 1 dose. 03/06/22 03/06/22 Yes Azucena Cecil, PA-C  acetaminophen (TYLENOL) 500 MG tablet Take 1 tablet (500 mg total) by mouth every 6 (six) hours as needed. 11/14/21   Redwine, Madison A, PA-C  acetaZOLAMIDE (DIAMOX) 250 MG tablet Take 4 tablets (1,000 mg total) by mouth daily. 09/13/21   Lomax, Amy, NP  albuterol (VENTOLIN HFA) 108 (90 Base) MCG/ACT inhaler Inhale 1-2 puffs into the lungs every 6 (six) hours as needed for wheezing or shortness of breath. 01/02/21   Suzy Bouchard, PA-C  cetirizine  (ZYRTEC) 10 MG tablet Take 10 mg by mouth daily as needed for allergies or rhinitis.    [provider]  dicyclomine (BENTYL) 10 MG capsule Take 1 capsule (10 mg total) by mouth 3 (three) times daily for 7 days. 11/14/21 11/21/21  Redwine, Madison A, PA-C  gabapentin (NEURONTIN) 100 MG capsule Take 2 capsules (200 mg total) by mouth at bedtime. 11/21/21   Lomax, Amy, NP  levonorgestrel (MIRENA) 20 MCG/24HR IUD 1 each by Intrauterine route once.    [provider]  loratadine (CLARITIN) 10 MG tablet Take 10 mg by mouth daily as needed for allergies or rhinitis.    [provider]  ondansetron (ZOFRAN) 4 MG tablet Take 1 tablet (4 mg total) by mouth every 8 (eight) hours as needed for nausea or vomiting. 12/03/18   Lomax, Amy, NP  Rimegepant Sulfate (NURTEC) 75 MG TBDP Take 75 mg by mouth every other day. 01/16/22   Lomax, Amy, NP  topiramate (TOPAMAX) 100 MG tablet Take 1 tablet (100 mg total) by mouth at bedtime. 09/13/21   Lomax, Amy, NP  valACYclovir (VALTREX) 500 MG tablet Take 500 mg by mouth 2 (two) times daily as needed (as needed for lesions).    [provider]      Allergies    Amoxicillin, Aspirin, Ibuprofen, Adhesive [tape], and Venlafaxine hcl    Review of Systems   Review of Systems  Constitutional:  Negative for fever.  Respiratory:  Negative  for shortness of breath.   Cardiovascular:  Negative for chest pain.  Gastrointestinal:  Positive for abdominal pain and constipation. Negative for nausea and vomiting.  Genitourinary:  Negative for dysuria.    Physical Exam Updated Vital Signs BP (!) 143/93   Pulse 67   Temp 99 F (37.2 C) (Oral)   Resp 18   LMP 02/20/2022 (Approximate)   SpO2 99%  Physical Exam Vitals and nursing note reviewed.  Constitutional:      General: She is not in acute distress.    Appearance: Normal appearance. She is not ill-appearing, toxic-appearing or diaphoretic.  HENT:     Head: Normocephalic and atraumatic.      Nose: Nose normal.     Mouth/Throat:     Mouth: Mucous membranes are moist.     Pharynx: Oropharynx is clear.  Eyes:     Extraocular Movements: Extraocular movements intact.     Conjunctiva/sclera: Conjunctivae normal.     Pupils: Pupils are equal, round, and reactive to light.  Cardiovascular:     Rate and Rhythm: Normal rate and regular rhythm.  Pulmonary:     Effort: Pulmonary effort is normal.     Breath sounds: Normal breath sounds. No wheezing.  Abdominal:     General: Abdomen is flat. Bowel sounds are normal.     Palpations: Abdomen is soft.     Tenderness: There is abdominal tenderness.     Comments: Generalized abdominal tenderness.  Patient abdomen soft and compressible.  Musculoskeletal:     Cervical back: Normal range of motion and neck supple. No tenderness.  Skin:    General: Skin is warm and dry.     Capillary Refill: Capillary refill takes less than 2 seconds.  Neurological:     Mental Status: She is alert and oriented to person, place, and time.     ED Results / Procedures / Treatments   Labs (all labs ordered are listed, but only abnormal results are displayed) Labs Reviewed  COMPREHENSIVE METABOLIC PANEL - Abnormal; Notable for the following components:      Result Value   Chloride 113 (*)    CO2 21 (*)    Calcium 8.8 (*)    Total Protein 6.2 (*)    All other components within normal limits  CBC WITH DIFFERENTIAL/PLATELET  LIPASE, BLOOD  URINALYSIS, ROUTINE W REFLEX MICROSCOPIC  HCG, SERUM, QUALITATIVE    EKG None  Radiology CT ABDOMEN PELVIS W CONTRAST  Result Date: 03/06/2022 CLINICAL DATA:  Abdominal pain and nausea for 2 days. Bowel obstruction suspected. EXAM: CT ABDOMEN AND PELVIS WITH CONTRAST TECHNIQUE: Multidetector CT imaging of the abdomen and pelvis was performed using the standard protocol following bolus administration of intravenous contrast. RADIATION DOSE REDUCTION: This exam was performed according to the departmental  dose-optimization program which includes automated exposure control, adjustment of the mA and/or kV according to patient size and/or use of iterative reconstruction technique. CONTRAST:  130m OMNIPAQUE IOHEXOL 300 MG/ML  SOLN COMPARISON:  11/14/2021 FINDINGS: Lower chest: No acute abnormality. Hepatobiliary: Stable peripheral 1.2 cm hypodensity over the anterior segment of the right lobe. Likely a hemangioma. Unremarkable gallbladder and biliary tree. Pancreas: Unremarkable. No pancreatic ductal dilatation or surrounding inflammatory changes. Spleen: Normal in size without focal abnormality. Adrenals/Urinary Tract: Normal adrenal glands. Bilateral nonobstructing calyceal stones. No hydronephrosis. Bladder is nondistended. Stomach/Bowel: Mild wall thickening about the gastric body and antrum may be due to underdistention versus gastritis. Normal caliber large and small bowel. Moderate colonic stool load in the  right colon. Normal appendix. Vascular/Lymphatic: Aortic atherosclerosis. No enlarged abdominal or pelvic lymph nodes. Reproductive: IUD in the uterus. Unchanged 1.4 cm hyperdense focus over the left anterior pelvis (2/65). This likely represents a fibroid as evaluated on ultrasound 11/14/2021. Ill-defined cervix/lower uterine segment similar to prior. Other: Trace free fluid in the pelvis.  No free intraperitoneal air. Musculoskeletal: No acute or significant osseous findings. IMPRESSION: 1. Mild wall thickening about the gastric body and antrum may be due to underdistention versus gastritis. 2. Moderate stool load in the right colon. Electronically Signed   By: Placido Sou M.D.   On: 03/06/2022 21:40   DG Abdomen 1 View  Result Date: 03/06/2022 CLINICAL DATA:  Pain.  Constipation. EXAM: ABDOMEN - 1 VIEW COMPARISON:  CT scan of the abdomen and pelvis November 14, 2021 FINDINGS: Significant fecal loading in the cecum and ascending colon. More mild fecal loading in the descending colon. No evidence of  bowel obstruction. No other bony or soft tissue abnormalities are noted. An IUD is seen in the pelvis. IMPRESSION: Significant fecal loading in the cecum and ascending colon. More mild fecal loading in the descending colon. Electronically Signed   By: Dorise Bullion III M.D.   On: 03/06/2022 13:38    Procedures Procedures    Medications Ordered in ED Medications  pantoprazole (PROTONIX) EC tablet 20 mg (has no administration in time range)  alum & mag hydroxide-simeth (MAALOX/MYLANTA) 200-200-20 MG/5ML suspension 30 mL (has no administration in time range)  HYDROcodone-acetaminophen (NORCO/VICODIN) 5-325 MG per tablet 1 tablet (1 tablet Oral Given 03/06/22 2033)  iohexol (OMNIPAQUE) 300 MG/ML solution 100 mL (100 mLs Intravenous Contrast Given 03/06/22 2119)    ED Course/ Medical Decision Making/ A&P  Medical Decision Making Amount and/or Complexity of Data Reviewed Labs: ordered. Radiology: ordered.  Risk OTC drugs. Prescription drug management.   42 year old female presents to the ED for evaluation.  Please see HPI for further details.  On examination patient is afebrile and nontachycardic.  Patient lung sounds are clear bilaterally, she is not hypoxic on room air.  Patient abdomen soft and compressible however she does have generalized abdominal tenderness nonfocal in nature.  Patient nontoxic in appearance.  Patient workup will include CBC with differential, CMP, lipase, urinalysis.  Also plain film imaging of abdomen.  CBC unremarkable without leukocytosis or anemia.  CMP unremarkable.  Lipase unremarkable.  Patient urinalysis unremarkable.  Plain film imaging of abdomen shows significant fecal loading in the cecum and ascending colon with more mild fecal loading in the descending colon.  After x-ray images discussed, patient concerned about gastritis.  Patient reports history of same in the past.  CT abdomen pelvis ordered at this time to assess.  CT abdomen pelvis shows mild  wall thickening of the gastric body and antrum possibly related to gastritis.  Also noted to have moderate stool load in right colon.  Patient provided Protonix, GI cocktail.  At this time, patient is stable for discharge.  Patient will be sent back to PCP for further management of IBS.  Patient offered enema, reports that typical MiraLAX regimen does not work for her.  Patient accepting, will send enema to pharmacy.  Patient will also be sent home with Protonix.  Patient advised to follow back up with PCP and GI.  Patient advised to return to the ED with any new or worsening signs or symptoms.  Patient discharged in stable condition.  Final Clinical Impression(s) / ED Diagnoses Final diagnoses:  Irritable bowel syndrome with constipation  Generalized abdominal pain  Acute gastritis without hemorrhage, unspecified gastritis type    Rx / DC Orders ED Discharge Orders          Ordered    pantoprazole (PROTONIX) 20 MG tablet  Daily        03/06/22 2214    sodium phosphate (FLEET) 7-19 GM/118ML ENEM   Once        03/06/22 2214              Lawana Chambers 03/06/22 2215    Fransico Meadow, MD 03/09/22 1723

## 2022-03-06 NOTE — ED Provider Triage Note (Signed)
Emergency Medicine Provider Triage Evaluation Note  [name] , a 42 y.o. female  was evaluated in triage.  Pt complains of abdominal pain for the last 2 days.  Reports has not had a bowel movement in 2 days.  Patient reports she is set to see GI in March for constipation issues.  Patient denies fevers, vomiting, diarrhea.  Patient denies dysuria.  Patient reports last menstrual period was 2 weeks ago.  Review of Systems  Positive:  Negative:   Physical Exam  BP (!) 145/96 (BP Location: Left Arm)   Pulse (!) 58   Temp 98.9 F (37.2 C) (Oral)   Resp 18   SpO2 100%  Gen:   Awake, no distress   Resp:  Normal effort  MSK:   Moves extremities without difficulty  Other:  Belly tender to palpation  Medical Decision Making  Medically screening exam initiated at 1:17 PM.  Appropriate orders placed.  [name] was informed that the remainder of the evaluation will be completed by another provider, this initial triage assessment does not replace that evaluation, and the importance of remaining in the ED until their evaluation is complete.     Azucena Cecil, PA-C 03/06/22 1318

## 2022-03-13 ENCOUNTER — Other Ambulatory Visit: Payer: Self-pay

## 2022-03-13 ENCOUNTER — Emergency Department (HOSPITAL_COMMUNITY)
Admission: EM | Admit: 2022-03-13 | Discharge: 2022-03-14 | Disposition: A | Discharge status: Home / Self Care | Payer: No Typology Code available for payment source | Attending: Emergency Medicine | Admitting: Emergency Medicine

## 2022-03-13 ENCOUNTER — Encounter (HOSPITAL_COMMUNITY): Payer: Self-pay | Admitting: Emergency Medicine

## 2022-03-13 DIAGNOSIS — R112 Nausea with vomiting, unspecified: Secondary | ICD-10-CM | POA: Insufficient documentation

## 2022-03-13 DIAGNOSIS — R1031 Right lower quadrant pain: Secondary | ICD-10-CM | POA: Insufficient documentation

## 2022-03-13 DIAGNOSIS — R1011 Right upper quadrant pain: Secondary | ICD-10-CM | POA: Insufficient documentation

## 2022-03-13 DIAGNOSIS — Z79899 Other long term (current) drug therapy: Secondary | ICD-10-CM | POA: Diagnosis not present

## 2022-03-13 DIAGNOSIS — I10 Essential (primary) hypertension: Secondary | ICD-10-CM | POA: Insufficient documentation

## 2022-03-13 DIAGNOSIS — R1084 Generalized abdominal pain: Secondary | ICD-10-CM

## 2022-03-13 LAB — BASIC METABOLIC PANEL
Anion gap: 3 — ABNORMAL LOW (ref 5–15)
BUN: 16 mg/dL (ref 6–20)
CO2: 20 mmol/L — ABNORMAL LOW (ref 22–32)
Calcium: 9 mg/dL (ref 8.9–10.3)
Chloride: 115 mmol/L — ABNORMAL HIGH (ref 98–111)
Creatinine, Ser: 1.07 mg/dL — ABNORMAL HIGH (ref 0.44–1.00)
GFR, Estimated: >60 mL/min (ref >60)
Glucose, Bld: 100 mg/dL — ABNORMAL HIGH (ref 70–99)
Potassium: 4.3 mmol/L (ref 3.5–5.1)
Sodium: 138 mmol/L (ref 135–145)

## 2022-03-13 LAB — CBC WITH DIFFERENTIAL/PLATELET
Abs Immature Granulocytes: 0.01 10*3/uL (ref 0.00–0.07)
Basophils Absolute: 0.1 10*3/uL (ref 0.0–0.1)
Basophils Relative: 1 %
Eosinophils Absolute: 0.1 10*3/uL (ref 0.0–0.5)
Eosinophils Relative: 3 %
HCT: 43.4 % (ref 36.0–46.0)
Hemoglobin: 13.8 g/dL (ref 12.0–15.0)
Immature Granulocytes: 0 %
Lymphocytes Relative: 34 %
Lymphs Abs: 1.8 10*3/uL (ref 0.7–4.0)
MCH: 30.5 pg (ref 26.0–34.0)
MCHC: 31.8 g/dL (ref 30.0–36.0)
MCV: 96 fL (ref 80.0–100.0)
Monocytes Absolute: 0.3 10*3/uL (ref 0.1–1.0)
Monocytes Relative: 6 %
Neutro Abs: 2.9 10*3/uL (ref 1.7–7.7)
Neutrophils Relative %: 56 %
Platelets: 243 10*3/uL (ref 150–400)
RBC: 4.52 MIL/uL (ref 3.87–5.11)
RDW: 13.3 % (ref 11.5–15.5)
WBC: 5.2 10*3/uL (ref 4.0–10.5)
nRBC: 0 % (ref 0.0–0.2)

## 2022-03-13 MED ORDER — METOCLOPRAMIDE HCL 5 MG/ML IJ SOLN
10.0000 mg | Freq: Once | INTRAMUSCULAR | Status: AC
  Administered 2022-03-14: 10 mg via INTRAVENOUS
  Filled 2022-03-13: qty 2

## 2022-03-13 MED ORDER — MORPHINE SULFATE (PF) 4 MG/ML IV SOLN
4.0000 mg | Freq: Once | INTRAVENOUS | Status: AC
  Administered 2022-03-14: 4 mg via INTRAVENOUS
  Filled 2022-03-13: qty 1

## 2022-03-13 MED ORDER — ONDANSETRON 8 MG PO TBDP
8.0000 mg | ORAL_TABLET | Freq: Once | ORAL | Status: AC
  Administered 2022-03-13: 8 mg via ORAL
  Filled 2022-03-13: qty 1

## 2022-03-13 MED ORDER — SODIUM CHLORIDE 0.9 % IV BOLUS
1000.0000 mL | Freq: Once | INTRAVENOUS | Status: AC
  Administered 2022-03-14: 1000 mL via INTRAVENOUS

## 2022-03-13 NOTE — ED Provider Triage Note (Signed)
Emergency Medicine Provider Triage Evaluation Note  [name] , a 42 y.o. female  was evaluated in triage.  Pt complains of right-sided abdominal pain.  Patient was seen last Friday and diagnosed with gastritis and IBS.  Patient was given MiraLAX trial along with a PPI and told to follow-up with her primary care physician.  Patient states yesterday the pain started to go from her lower abdomen to her right abdomen.  Patient reports being nauseous and having episodes of nonbloody emesis episodes of nonbloody diarrhea.  Patient states she is eating very little because anytime she has any small amount she feels full.  Patient's last menstrual period was 02/20/2022 and patient has an IUD and does not believe she is pregnant.  Patient denies any fevers, chest pain, syncope  Review of Systems  Positive: See HPI Negative: See HPI  Physical Exam  LMP 02/20/2022 (Approximate)  Gen:   Awake, no distress   Resp:  Normal effort  MSK:   Moves extremities without difficulty  Other:  Tender to palpation on R abd  Medical Decision Making  Medically screening exam initiated at 7:38 PM.  Appropriate orders placed.  Adeana Grilliot was informed that the remainder of the evaluation will be completed by another provider, this initial triage assessment does not replace that evaluation, and the importance of remaining in the ED until their evaluation is complete.  Workup initiated   Elvina Sidle 03/13/22 1940

## 2022-03-13 NOTE — ED Triage Notes (Signed)
Pt arriving for abdominal pain and n/v. Hx of IBS. Pt was seen for sam eon 03/06/22 and was told she has gastritis. Pt had been given directions to take Protonix.

## 2022-03-13 NOTE — ED Provider Notes (Signed)
[facility] Provider Note   CSN: [id number] Arrival date & time: 03/13/22  1851     History  Chief Complaint  Patient presents with   Abdominal Pain    [name] is a 42 y.o. female.  42 year old female presents emergency room with complaint of right-sided abdominal pain onset just over a week ago.  Recurrent in nature over the past year, with history of IBS.  Patient was seen here 03/06/2022, had CT and labs, took a laxative and had a bowel movement which was loose, continues to have right-sided abdominal discomfort with nausea and vomiting.  States that she is not scheduled to follow-up with GI until April.  Pain is worse with movement.       Home Medications Prior to Admission medications   Medication Sig Start Date End Date Taking? Authorizing Provider  acetaminophen (TYLENOL) 500 MG tablet Take 1 tablet (500 mg total) by mouth every 6 (six) hours as needed. 11/14/21   Redwine, Madison A, PA-C  acetaZOLAMIDE (DIAMOX) 250 MG tablet Take 4 tablets (1,000 mg total) by mouth daily. 09/13/21   Lomax, Amy, NP  albuterol (VENTOLIN HFA) 108 (90 Base) MCG/ACT inhaler Inhale 1-2 puffs into the lungs every 6 (six) hours as needed for wheezing or shortness of breath. 01/02/21   Suzy Bouchard, PA-C  cetirizine (ZYRTEC) 10 MG tablet Take 10 mg by mouth daily as needed for allergies or rhinitis.    [provider]  dicyclomine (BENTYL) 10 MG capsule Take 1 capsule (10 mg total) by mouth 3 (three) times daily for 7 days. 11/14/21 11/21/21  Redwine, Madison A, PA-C  gabapentin (NEURONTIN) 100 MG capsule Take 2 capsules (200 mg total) by mouth at bedtime. 11/21/21   Lomax, Amy, NP  levonorgestrel (MIRENA) 20 MCG/24HR IUD 1 each by Intrauterine route once.    [provider]  loratadine (CLARITIN) 10 MG tablet Take 10 mg by mouth daily as needed for allergies or rhinitis.    [provider]  ondansetron (ZOFRAN) 4 MG  tablet Take 1 tablet (4 mg total) by mouth every 8 (eight) hours as needed for nausea or vomiting. 12/03/18   Lomax, Amy, NP  pantoprazole (PROTONIX) 20 MG tablet Take 1 tablet (20 mg total) by mouth daily. 03/06/22   Azucena Cecil, PA-C  Rimegepant Sulfate (NURTEC) 75 MG TBDP Take 75 mg by mouth every other day. 01/16/22   Lomax, Amy, NP  topiramate (TOPAMAX) 100 MG tablet Take 1 tablet (100 mg total) by mouth at bedtime. 09/13/21   Lomax, Amy, NP  valACYclovir (VALTREX) 500 MG tablet Take 500 mg by mouth 2 (two) times daily as needed (as needed for lesions).    [provider]      Allergies    Amoxicillin, Aspirin, Ibuprofen, Adhesive [tape], and Venlafaxine hcl    Review of Systems   Review of Systems Negative except as per HPI Physical Exam Updated Vital Signs BP (!) 162/95 (BP Location: Left Arm)   Pulse 70   Temp 98.4 F (36.9 C) (Oral)   Resp 16   LMP 02/20/2022 (Approximate)   SpO2 100%  Physical Exam Vitals and nursing note reviewed.  Constitutional:      General: She is not in acute distress.    Appearance: She is well-developed. She is not diaphoretic.  HENT:     Head: Normocephalic and atraumatic.  Cardiovascular:     Rate and Rhythm: Normal rate and regular rhythm.  Heart sounds: Normal heart sounds.  Pulmonary:     Effort: Pulmonary effort is normal.     Breath sounds: Normal breath sounds.  Abdominal:     Palpations: Abdomen is soft.     Tenderness: There is abdominal tenderness in the right upper quadrant and right lower quadrant. There is no right CVA tenderness, left CVA tenderness, guarding or rebound. Negative signs include Sherley Mckenney's sign.  Skin:    General: Skin is warm and dry.     Findings: No erythema or rash.  Neurological:     Mental Status: She is alert and oriented to person, place, and time.  Psychiatric:        Behavior: Behavior normal.     ED Results / Procedures / Treatments   Labs (all labs ordered are listed, but  only abnormal results are displayed) Labs Reviewed  BASIC METABOLIC PANEL - Abnormal; Notable for the following components:      Result Value   Chloride 115 (*)    CO2 20 (*)    Glucose, Bld 100 (*)    Creatinine, Ser 1.07 (*)    Anion gap 3 (*)    All other components within normal limits  CBC WITH DIFFERENTIAL/PLATELET  HEPATIC FUNCTION PANEL  LIPASE, BLOOD  URINALYSIS, ROUTINE W REFLEX MICROSCOPIC    EKG None  Radiology No results found.  Procedures Procedures    Medications Ordered in ED Medications  sodium chloride 0.9 % bolus 1,000 mL (has no administration in time range)  metoCLOPramide (REGLAN) injection 10 mg (has no administration in time range)  morphine (PF) 4 MG/ML injection 4 mg (has no administration in time range)  ondansetron (ZOFRAN-ODT) disintegrating tablet 8 mg (8 mg Oral Given 03/13/22 2003)    ED Course/ Medical Decision Making/ A&P                             Medical Decision Making Amount and/or Complexity of Data Reviewed Labs: ordered.  Risk Prescription drug management.   This patient presents to the ED for concern of abdominal pain, this involves an extensive number of treatment options, and is a complaint that carries with it a high risk of complications and morbidity.  The differential diagnosis includes but not limited to gastritis, gastroenteritis, appendicitis, cholecystitis, IBS,    Co morbidities that complicate the patient evaluation  Migraine, hypertension, PCOS, IBS   Additional history obtained:  External records from outside source obtained and reviewed including prior imaging over the past year including barium swallow, abdominal ultrasound, pelvic ultrasound, CT abdomen pelvis with contrast x 4   Lab Tests:  I Ordered, and personally interpreted labs.  The pertinent results include:  UA WNL. BMP with bicarb 20, gap 3, glucose 100. Hepatic function for right side abdominal pain without significant findings. Lipase  56, not elevated enough for pancreatitis. CBC WNL.    Problem List / ED Course / Critical interventions / Medication management  42 year old female presents with complaint of right-sided abdominal pain with nausea and vomiting.  Reports multiple similar episodes over the past year, is followed by GI with scheduled appointment in 2 months.  On exam, found to have right-sided tenderness without guarding or rebound.  Labs without significant findings compared to prior on file.  Recent imaging reviewed without significant results.  Multiple studies over the past year, discussed with patient, symptoms improved at this time, will plan for follow-up with her GI provider, return to ER for  worsening worsening or concerning symptoms anytime for recheck. I ordered medication including morphine, zofran  for pain, nausea, vomiting  Reevaluation of the patient after these medicines showed that the patient improved I have reviewed the patients home medicines and have made adjustments as needed   Social Determinants of Health:  Has GI for follow up   Test / Admission - Considered:  Consider CT however recent CT reviewed without significant findings. Multiple Cts, Uss, studies completed in the past year, further imaging deferred after shared decision making         Final Clinical Impression(s) / ED Diagnoses Final diagnoses:  None    Rx / DC Orders ED Discharge Orders     None         Tacy Learn, PA-C 03/14/22 5974    Merryl Hacker, MD 03/14/22 2326

## 2022-03-13 NOTE — ED Provider Notes (Incomplete)
[facility] Provider Note   CSN: [id number] Arrival date & time: 03/13/22  1851     History {Add pertinent medical, surgical, social history, OB history to HPI:1} Chief Complaint  Patient presents with  . Abdominal Pain    [name] is a 42 y.o. female.  HPI     Home Medications Prior to Admission medications   Medication Sig Start Date End Date Taking? Authorizing Provider  acetaminophen (TYLENOL) 500 MG tablet Take 1 tablet (500 mg total) by mouth every 6 (six) hours as needed. 11/14/21   Redwine, Madison A, PA-C  acetaZOLAMIDE (DIAMOX) 250 MG tablet Take 4 tablets (1,000 mg total) by mouth daily. 09/13/21   Lomax, Amy, NP  albuterol (VENTOLIN HFA) 108 (90 Base) MCG/ACT inhaler Inhale 1-2 puffs into the lungs every 6 (six) hours as needed for wheezing or shortness of breath. 01/02/21   Suzy Bouchard, PA-C  cetirizine (ZYRTEC) 10 MG tablet Take 10 mg by mouth daily as needed for allergies or rhinitis.    [provider]  dicyclomine (BENTYL) 10 MG capsule Take 1 capsule (10 mg total) by mouth 3 (three) times daily for 7 days. 11/14/21 11/21/21  Redwine, Madison A, PA-C  gabapentin (NEURONTIN) 100 MG capsule Take 2 capsules (200 mg total) by mouth at bedtime. 11/21/21   Lomax, Amy, NP  levonorgestrel (MIRENA) 20 MCG/24HR IUD 1 each by Intrauterine route once.    [provider]  loratadine (CLARITIN) 10 MG tablet Take 10 mg by mouth daily as needed for allergies or rhinitis.    [provider]  ondansetron (ZOFRAN) 4 MG tablet Take 1 tablet (4 mg total) by mouth every 8 (eight) hours as needed for nausea or vomiting. 12/03/18   Lomax, Amy, NP  pantoprazole (PROTONIX) 20 MG tablet Take 1 tablet (20 mg total) by mouth daily. 03/06/22   Azucena Cecil, PA-C  Rimegepant Sulfate (NURTEC) 75 MG TBDP Take 75 mg by mouth every other day. 01/16/22   Lomax, Amy, NP  topiramate (TOPAMAX) 100 MG tablet Take 1  tablet (100 mg total) by mouth at bedtime. 09/13/21   Lomax, Amy, NP  valACYclovir (VALTREX) 500 MG tablet Take 500 mg by mouth 2 (two) times daily as needed (as needed for lesions).    [provider]      Allergies    Amoxicillin, Aspirin, Ibuprofen, Adhesive [tape], and Venlafaxine hcl    Review of Systems   Review of Systems  Physical Exam Updated Vital Signs BP (!) 162/95 (BP Location: Left Arm)   Pulse 70   Temp 98.4 F (36.9 C) (Oral)   Resp 16   LMP 02/20/2022 (Approximate)   SpO2 100%  Physical Exam  ED Results / Procedures / Treatments   Labs (all labs ordered are listed, but only abnormal results are displayed) Labs Reviewed  BASIC METABOLIC PANEL - Abnormal; Notable for the following components:      Result Value   Chloride 115 (*)    CO2 20 (*)    Glucose, Bld 100 (*)    Creatinine, Ser 1.07 (*)    Anion gap 3 (*)    All other components within normal limits  CBC WITH DIFFERENTIAL/PLATELET  HEPATIC FUNCTION PANEL  LIPASE, BLOOD  URINALYSIS, ROUTINE W REFLEX MICROSCOPIC    EKG None  Radiology No results found.  Procedures Procedures  {Document cardiac monitor, telemetry assessment procedure when appropriate:1}  Medications Ordered in ED Medications  sodium chloride 0.9 %  bolus 1,000 mL (has no administration in time range)  metoCLOPramide (REGLAN) injection 10 mg (has no administration in time range)  morphine (PF) 4 MG/ML injection 4 mg (has no administration in time range)  ondansetron (ZOFRAN-ODT) disintegrating tablet 8 mg (8 mg Oral Given 03/13/22 2003)    ED Course/ Medical Decision Making/ A&P   {   Click here for ABCD2, HEART and other calculatorsREFRESH Note before signing :1}                          Medical Decision Making Amount and/or Complexity of Data Reviewed Labs: ordered.  Risk Prescription drug management.   ***  {Document critical care time when appropriate:1} {Document review of labs and clinical decision  tools ie heart score, Chads2Vasc2 etc:1}  {Document your independent review of radiology images, and any outside records:1} {Document your discussion with family members, caretakers, and with consultants:1} {Document social determinants of health affecting pt's care:1} {Document your decision making why or why not admission, treatments were needed:1} Final Clinical Impression(s) / ED Diagnoses Final diagnoses:  None    Rx / DC Orders ED Discharge Orders     None

## 2022-03-14 LAB — HEPATIC FUNCTION PANEL
ALT: 14 U/L (ref 0–44)
AST: 14 U/L — ABNORMAL LOW (ref 15–41)
Albumin: 4.2 g/dL (ref 3.5–5.0)
Alkaline Phosphatase: 45 U/L (ref 38–126)
Bilirubin, Direct: <0.1 mg/dL (ref 0.0–0.2)
Total Bilirubin: 0.7 mg/dL (ref 0.3–1.2)
Total Protein: 7 g/dL (ref 6.5–8.1)

## 2022-03-14 LAB — URINALYSIS, ROUTINE W REFLEX MICROSCOPIC
Bilirubin Urine: NEGATIVE
Glucose, UA: NEGATIVE mg/dL
Hgb urine dipstick: NEGATIVE
Ketones, ur: NEGATIVE mg/dL
Leukocytes,Ua: NEGATIVE
Nitrite: NEGATIVE
Protein, ur: NEGATIVE mg/dL
Specific Gravity, Urine: 1.021 (ref 1.005–1.030)
pH: 6 (ref 5.0–8.0)

## 2022-03-14 LAB — LIPASE, BLOOD: Lipase: 56 U/L — ABNORMAL HIGH (ref 11–51)

## 2022-03-14 NOTE — Discharge Instructions (Signed)
Follow-up with your PCP for recheck.  Call your GI office to see if they have an earlier ER follow-up appointment available.  Return to ER for worsening or concerning symptoms.

## 2022-03-28 NOTE — Progress Notes (Unsigned)
PATIENT: [name] DOB: [birth year]  REASON FOR VISIT: follow up HISTORY FROM: patient  No chief complaint on file.   HISTORY OF PRESENT ILLNESS:  03/28/22 ALL:  [name] returns for follow up for IIH. She continues acetazolimide 1092m, topiramate 2074mand gabapentin 20039mt bedtime.   09/13/2021 ALL: [name] for follow up for IIH. She continues acetazolamide 1000m65mopiramate 200mg37m gabapentin 200mg 62medtime. Nurtec used for abortive therapy. She is having mild headaches 6-7 times a month. She is having migrainous headaches about 4 times a month. Nurtec works well. She reports last eye exam was at least 2 years ago. No vision changes or TOV. She works swing shifts. 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 She usually drinks 120 ounces of water daily. She has gained about 10lbs over the past year. She does not sleep well. She is having continued concerns of fatigue and brain fog. She has trouble with word finding.   09/13/2020 ALL: [name] for annual follow up for IIH. She continues acetazolamide 1000mg d32m (couldn't remember BID dosing), topiramate 200mg da71mand gabapentin 200mg at 63mime. Nurtec used for abortive therapy. She feels this works better than naratriptan. She was doing very well with rare headaches but reports being out of gabapentin for about 6 months (didn't request refills) and off topiramate and acetazolamide for the past two weeks (pharmacy back order). When taking current regimen as prescribed she feels headaches are well managed. No vision changes. Last eye exam about a year ago.   09/15/2019 ALL:  [name].o. f58ale here today for follow up of IIH. She continues Diamox 750mg BID,15miramate 200mg and g55mentin 200mg at bed76m. She uses eletriptan for abortive therapy. She denies any concerns of headaches. She may have a mild headache from time to time. She is doing well. She reports from time  to time, she will have twitching of her eye but it resolves spontaneously. She stays well hydrated. She was last seen by ophthalmology in 03/2019. She does not have scheduled follow up.   03/17/2019 ALL: Denise HughMemori Hiramoto. fema50 here today for follow up of IIH. She continues Diamox 750mg twice d3m, topiramate 200mg and gaba55min 200mg at bedtim30mhe reports that she has done well until last week. She started having a mild headache about 7 days ago. Headache was centered at the top of her head. Pain was dull aching pain. No light or sound sensitivity but she did have intermittent nausea. Vision was blurry when headache worsened but no vision loss. She reports that headache progressed for 3-4 days. She took one dose of eletriptan on Friday and two doses on Saturday. Sunday the headache was improved. Today headache is better. Blurry vision is significantly improved. She does report allergy symptoms as well. She has not taken allergy medication. She is not taking Excedrin. She is eating well and drinking plenty of water.    01/16/2019 ALL: [name] Denise Hardbargeremale 73re today for follow up of IIH. She continues Diamox 750mg twice dail27mopiramate 200mg and gabapen65m200mg at bedtime d45m. She reports that headaches did improve with increased dose of Diamox. About three days ago, she felt a headache starting. She has right sided pressure, worse behind right eye. She was seen yesterday by ER for acute intractable headache treated with metoclopramide and dexamethasone 10mg. She has note56mme improvement today but headache continues. She did forget  to tell UC provider yesterday that for a period of time, she felt as if she could only see black in the right outer vision fields. Vision is normal today with exception of mild blurriness with significant pain. She has felt nauseated as well and noticed light sensitivity driving to our appt today. She has not taken any OTC medications or Relpax as  she was unsure if this would help. She is usually cautious about taking OTC medications. She uses Mirena for severe dysmenorrhea. We have discussed possible role of Mirena in worsening  IIH, however, data is conflicting. She is also followed very closely by Dr Nelda Marseille, gynecology. Patient feels benefit of controlled menstrual cycles outweighs risks of worsening IIH at this time.    12/03/2018 ALL: [name] is a 42 y.o. female here today for follow up for headaches with new diagnosis of IIH. She has been seen several times in the ER for worsening headaches since being seen by me in 06/2018. At that time, she reported that headaches were doing well on topiramate and gabapentin. Relpax worked well for abortive therapy. She started having worsening of central headaches with pressure behind her eyes about 2-3 weeks ago. She reports that her headache was persistent and unresponsive to therapy. Pressure behind right eye with peripheral vision loss. Last week, LP showed opening pressure of 30. Closing pressure of 12 and headache improved. She was started on Diamox 549m twice daily and [place] She has tolerated Diamox without adverse effects over the past 10 days. She has continued topiramate 2046mand gabapentin 1003mt bedtime. MRI was normal. She has noticed more trouble with her memory. She feels that her words are not coming to her. She has ringing in her ears. She is nauseated at times. She has not had an eye exam. She has Mirena for birth control placed about a year ago. Twin sister was diagnosed with IIH about 10 years ago.      07/04/2018 ALL: [name] a [age]o. female for follow up of migraines. She is taking topirimate 200m49md gabapentin 100mg78mbedtime. She uses Relpax for abortive therapy. She does report sleepiness with Relpax. She has had about 3 migraines in the past year. She has 3-4 headaches per month. She feels that she is doing really well.      HISTORY (copied from  MeganLas Lomas on 10/29/2017   [name] 37 ye42 old female with a history of migraine headaches.  She returns today for follow-up.  She states overall her migraine headaches are better.  She has not had a migraine in the last month.  She states that she is not headaches at least 3 times a week.  She typically can take Tylenol or Excedrin tension.  She reports that she does not use Relpax as much because she needs to lay down after she takes it.  She states that she works different shifts at her job which also may be affecting her sleep.     HISTORY 04/19/17 [name] 36 ye78 old female with a history of migraine.  She is currently taking 200 mg at bedtime.  She uses Relpax to treat her acute migraines.  She reports that she has had a ongoing headache for the last 3 weeks.  She reports that her pain fluctuates from a 4 out of 10 to a 7 out of 10.  She reports that the headache has been constant.  She has used Tylenol as well  as Relpax.  She reports that these medications has decreased the severity but essentially did not resolve the headache.  She reports the headache usually starts in the center of the head and radiates down to the forehead.  She reports with severe headaches she will have photophobia.  She reports occasionally she will have halos in her vision.  She confirmed that she is also had nausea and vomiting.  She has been using Zofran.  In the past she has had a prednisone Dosepak for that it works well for her headache.  She returns today for evaluation.   REVIEW OF SYSTEMS: Out of a complete 14 system review of symptoms, the patient complains only of the following symptoms, headaches, fatigue and all other reviewed systems are negative.  ALLERGIES: Allergies  Allergen Reactions   Amoxicillin Hives, Nausea And Vomiting and Nausea Only    Has patient had a PCN reaction causing immediate rash, facial/tongue/throat swelling, SOB or lightheadedness with hypotension: Yes Has  patient had a PCN reaction causing severe rash involving mucus membranes or skin necrosis: No Has patient had a PCN reaction that required hospitalization: No Has patient had a PCN reaction occurring within the last 10 years: No If all of the above answers are "NO", then may proceed with Cephalosporin use.    Aspirin Hives, Nausea And Vomiting and Nausea Only   Ibuprofen Hives, Nausea And Vomiting and Nausea Only   Adhesive [Tape] Rash and Other (See Comments)    CERTAIN BANDAGES BREAK OUT THE SKIN !!   Venlafaxine Hcl Palpitations    HOME MEDICATIONS: Outpatient Medications Prior to Visit  Medication Sig Dispense Refill   acetaminophen (TYLENOL) 500 MG tablet Take 1 tablet (500 mg total) by mouth every 6 (six) hours as needed. (Patient not taking: Reported on 03/13/2022) 30 tablet 0   acetaZOLAMIDE (DIAMOX) 250 MG tablet Take 4 tablets (1,000 mg total) by mouth daily. 360 tablet 3   albuterol (VENTOLIN HFA) 108 (90 Base) MCG/ACT inhaler Inhale 1-2 puffs into the lungs every 6 (six) hours as needed for wheezing or shortness of breath. 18 g 0   cetirizine (ZYRTEC) 10 MG tablet Take 10 mg by mouth daily as needed for allergies or rhinitis.     dicyclomine (BENTYL) 10 MG capsule Take 1 capsule (10 mg total) by mouth 3 (three) times daily for 7 days. 21 capsule 0   gabapentin (NEURONTIN) 100 MG capsule Take 2 capsules (200 mg total) by mouth at bedtime. 180 capsule 3   hyoscyamine (ANASPAZ) 0.125 MG TBDP disintergrating tablet Take 0.125 mg by mouth every 4 (four) hours as needed for bladder spasms or cramping.     levonorgestrel (MIRENA) 20 MCG/24HR IUD 1 each by Intrauterine route once.     loratadine (CLARITIN) 10 MG tablet Take 10 mg by mouth daily as needed for allergies or rhinitis.     ondansetron (ZOFRAN) 4 MG tablet Take 1 tablet (4 mg total) by mouth every 8 (eight) hours as needed for nausea or vomiting. (Patient not taking: Reported on 03/13/2022) 30 tablet 3   ondansetron (ZOFRAN-ODT) 8  MG disintegrating tablet Take 8 mg by mouth 3 (three) times daily.     pantoprazole (PROTONIX) 20 MG tablet Take 1 tablet (20 mg total) by mouth daily. (Patient not taking: Reported on 03/13/2022) 30 tablet 0   pantoprazole (PROTONIX) 40 MG tablet Take 40 mg by mouth 2 (two) times daily.     Potassium Citrate 15 MEQ (1620 MG) TBCR Take 1 tablet by  mouth 2 (two) times daily.     Rimegepant Sulfate (NURTEC) 75 MG TBDP Take 75 mg by mouth every other day. 45 tablet 3   topiramate (TOPAMAX) 100 MG tablet Take 1 tablet (100 mg total) by mouth at bedtime. (Patient taking differently: Take 100 mg by mouth in the morning.) 90 tablet 1   valACYclovir (VALTREX) 500 MG tablet Take 500 mg by mouth 2 (two) times daily as needed (as needed for lesions).     No facility-administered medications prior to visit.    PAST MEDICAL HISTORY: Past Medical History:  Diagnosis Date   Asthma    Cancer ([place])    cancer in my leg   Common migraine with intractable migraine 05/11/2016   Hypertension    Migraines    PCOS (polycystic ovarian syndrome)    Reactive airway disease    Seizures ([facility])    c/b anesthesia    PAST SURGICAL HISTORY: Past Surgical History:  Procedure Laterality Date   CESAREAN SECTION     one previous   HERNIA REPAIR     NASAL RECONSTRUCTION      FAMILY HISTORY: Family History  Problem Relation Age of Onset   Cancer Mother    Kidney cancer Mother    Colon cancer Father    Diabetes Maternal Grandmother    Diabetes Paternal Grandfather    Breast cancer Maternal Aunt    Cancer Maternal Aunt        ovarian    SOCIAL HISTORY: Social History   Socioeconomic History   Marital status: Married    Spouse name: Not on file   Number of children: Not on file   Years of education: Not on file   Highest education level: Not on file  Occupational History   Not on file  Tobacco Use   Smoking status: Never   Smokeless tobacco: Never  Substance and Sexual Activity   Alcohol use: Yes     Comment: occasionally   Drug use: No   Sexual activity: Yes    Birth control/protection: I.U.D.  Other Topics Concern   Not on file  Social History Narrative   Lives at home with husband and son   Right handed   Caffeine: 1 cup/day (coffee or tea)   Social Determinants of Health   Financial Resource Strain: Not on file  Food Insecurity: Not on file  Transportation Needs: Not on file  Physical Activity: Not on file  Stress: Not on file  Social Connections: Not on file  Intimate Partner Violence: Not on file      PHYSICAL EXAM  There were no vitals filed for this visit.    There is no height or weight on file to calculate BMI.  Generalized: Well developed, in no acute distress  Cardiology: normal rate and rhythm, no murmur noted Respiratory: clear to auscultation bilaterally  Neurological examination  Mentation: Alert oriented to time, place, history taking. Follows all commands speech and language fluent Cranial nerve II-XII: Pupils were equal round reactive to light. Extraocular movements were full, visual field were full on confrontational test. Facial sensation and strength were normal. Head turning and shoulder shrug  were normal and symmetric. Motor: The motor testing reveals 5 over 5 strength of all 4 extremities. Good symmetric motor tone is noted throughout.  Gait and station: Gait is normal.    DIAGNOSTIC DATA (LABS, IMAGING, TESTING) - I reviewed patient records, labs, notes, testing and imaging myself where available.      No data to  display           Lab Results  Component Value Date   WBC 5.2 03/13/2022   HGB 13.8 03/13/2022   HCT 43.4 03/13/2022   MCV 96.0 03/13/2022   PLT 243 03/13/2022      Component Value Date/Time   NA 138 03/13/2022 1948   K 4.3 03/13/2022 1948   CL 115 (H) 03/13/2022 1948   CO2 20 (L) 03/13/2022 1948   GLUCOSE 100 (H) 03/13/2022 1948   BUN 16 03/13/2022 1948   CREATININE 1.07 (H) 03/13/2022 1948   CALCIUM 9.0  03/13/2022 1948   PROT 7.0 03/14/2022 0030   ALBUMIN 4.2 03/14/2022 0030   AST 14 (L) 03/14/2022 0030   ALT 14 03/14/2022 0030   ALKPHOS 45 03/14/2022 0030   BILITOT 0.7 03/14/2022 0030   GFRNONAA >60 03/13/2022 1948   GFRAA >60 04/22/2019 1213   No results found for: "CHOL", "HDL", "LDLCALC", "LDLDIRECT", "TRIG", "CHOLHDL" No results found for: "HGBA1C" No results found for: "VITAMINB12" Lab Results  Component Value Date   TSH 0.241 (L) 05/19/2021    ASSESSMENT AND PLAN 42 y.o. year old female  has a past medical history of Asthma, Cancer ([place]), Common migraine with intractable migraine (05/11/2016), Hypertension, Migraines, PCOS (polycystic ovarian syndrome), Reactive airway disease, and Seizures ([place]). here with   No diagnosis found.    [name] reports headaches seem fairly well managed, however, she is concerned that fatigue and brain fog may be worsening. She has been on both acetazolamide and topiramate for years. I would like to wean topiramate. I will switch her Nurtec to every other day dosing for migraine prevention. We will lower topiramate to 124m after she has been on Nurtec QOD for 2 weeks.  We will continue acetazolimide 10043mand gabapentin 20040maily. She will us Korealenol as needed for abortive therapy. She was encouraged to schedule follow up with ophthalmology asap. I will check in with her in 4-6 weeks and plan to decrease topiramate to 4m14mily if doing well. She was advised against pregnancy. She will continue healthy lifestyle habits. She was encouraged to stay well hydrated. She will follow up in 1 year, sooner if needed. She verbalizes understanding and agreement with this plan.    No orders of the defined types were placed in this encounter.     No orders of the defined types were placed in this encounter.       [name] 03/28/2022, 12:41 PM [facility] [id number]itCarrizoeAfton [phone]

## 2022-03-28 NOTE — Patient Instructions (Signed)
Below is our plan:  We will continue acetazolimide 1067m, topiramate 2075mand gabapentin 20059mt bedtime.  Please make sure you are staying well hydrated. I recommend 50-60 ounces daily. Well balanced diet and regular exercise encouraged. Consistent sleep schedule with 6-8 hours recommended.   Please continue follow up with care team as directed.   Follow up with *** in ***  You may receive a survey regarding today's visit. I encourage you to leave honest feed back as I do use this information to improve patient care. Thank you for seeing me today!

## 2022-03-29 ENCOUNTER — Ambulatory Visit: Payer: No Typology Code available for payment source | Admitting: Family Medicine

## 2022-03-29 ENCOUNTER — Encounter: Payer: Self-pay | Admitting: Family Medicine

## 2022-03-29 DIAGNOSIS — G43009 Migraine without aura, not intractable, without status migrainosus: Secondary | ICD-10-CM

## 2022-03-29 MED ORDER — NURTEC 75 MG PO TBDP: 75.0000 mg | ORAL_TABLET | ORAL | 3 refills | Status: DC

## 2022-03-29 MED ORDER — TOPIRAMATE 100 MG PO TABS: 100.0000 mg | ORAL_TABLET | Freq: Every day | ORAL | 3 refills | Status: DC

## 2022-03-29 MED ORDER — ACETAZOLAMIDE 250 MG PO TABS: 1000.0000 mg | ORAL_TABLET | Freq: Every day | ORAL | 3 refills | Status: DC

## 2022-03-29 MED ORDER — GABAPENTIN 100 MG PO CAPS: 200.0000 mg | ORAL_CAPSULE | Freq: Every day | ORAL | 3 refills | Status: DC

## 2022-04-13 DIAGNOSIS — K5909 Other constipation: Secondary | ICD-10-CM | POA: Diagnosis not present

## 2022-05-30 DIAGNOSIS — K5909 Other constipation: Secondary | ICD-10-CM | POA: Diagnosis not present

## 2022-06-14 ENCOUNTER — Encounter: Payer: Self-pay | Admitting: Family Medicine

## 2022-06-16 DIAGNOSIS — H6692 Otitis media, unspecified, left ear: Secondary | ICD-10-CM | POA: Diagnosis not present

## 2022-06-16 DIAGNOSIS — J069 Acute upper respiratory infection, unspecified: Secondary | ICD-10-CM | POA: Diagnosis not present

## 2022-06-22 DIAGNOSIS — H6692 Otitis media, unspecified, left ear: Secondary | ICD-10-CM | POA: Diagnosis not present

## 2022-06-22 DIAGNOSIS — H6993 Unspecified Eustachian tube disorder, bilateral: Secondary | ICD-10-CM | POA: Diagnosis not present

## 2022-06-27 DIAGNOSIS — S0300XA Dislocation of jaw, unspecified side, initial encounter: Secondary | ICD-10-CM | POA: Diagnosis not present

## 2022-07-13 DIAGNOSIS — D235 Other benign neoplasm of skin of trunk: Secondary | ICD-10-CM | POA: Diagnosis not present

## 2022-07-17 DIAGNOSIS — Z01419 Encounter for gynecological examination (general) (routine) without abnormal findings: Secondary | ICD-10-CM | POA: Diagnosis not present

## 2022-07-21 ENCOUNTER — Other Ambulatory Visit: Payer: Self-pay | Admitting: Obstetrics and Gynecology

## 2022-07-21 DIAGNOSIS — Z1231 Encounter for screening mammogram for malignant neoplasm of breast: Secondary | ICD-10-CM

## 2022-07-25 ENCOUNTER — Ambulatory Visit
Admission: RE | Admit: 2022-07-25 | Discharge: 2022-07-25 | Disposition: A | Discharge status: Home / Self Care | Payer: No Typology Code available for payment source | Source: Ambulatory Visit | Attending: Obstetrics and Gynecology | Admitting: Obstetrics and Gynecology

## 2022-07-25 DIAGNOSIS — Z1231 Encounter for screening mammogram for malignant neoplasm of breast: Secondary | ICD-10-CM

## 2022-08-22 DIAGNOSIS — N200 Calculus of kidney: Secondary | ICD-10-CM | POA: Diagnosis not present

## 2022-08-23 DIAGNOSIS — R278 Other lack of coordination: Secondary | ICD-10-CM | POA: Diagnosis not present

## 2022-08-23 DIAGNOSIS — M6281 Muscle weakness (generalized): Secondary | ICD-10-CM | POA: Diagnosis not present

## 2022-08-23 DIAGNOSIS — R1031 Right lower quadrant pain: Secondary | ICD-10-CM | POA: Diagnosis not present

## 2022-08-23 DIAGNOSIS — K5900 Constipation, unspecified: Secondary | ICD-10-CM | POA: Diagnosis not present

## 2022-08-23 DIAGNOSIS — M6289 Other specified disorders of muscle: Secondary | ICD-10-CM | POA: Diagnosis not present

## 2022-09-01 DIAGNOSIS — Z888 Allergy status to other drugs, medicaments and biological substances status: Secondary | ICD-10-CM | POA: Diagnosis not present

## 2022-09-01 DIAGNOSIS — G43909 Migraine, unspecified, not intractable, without status migrainosus: Secondary | ICD-10-CM | POA: Diagnosis not present

## 2022-09-01 DIAGNOSIS — N202 Calculus of kidney with calculus of ureter: Secondary | ICD-10-CM | POA: Diagnosis not present

## 2022-09-01 DIAGNOSIS — Z880 Allergy status to penicillin: Secondary | ICD-10-CM | POA: Diagnosis not present

## 2022-09-01 DIAGNOSIS — K219 Gastro-esophageal reflux disease without esophagitis: Secondary | ICD-10-CM | POA: Diagnosis not present

## 2022-09-01 DIAGNOSIS — J45909 Unspecified asthma, uncomplicated: Secondary | ICD-10-CM | POA: Diagnosis not present

## 2022-09-01 DIAGNOSIS — I499 Cardiac arrhythmia, unspecified: Secondary | ICD-10-CM | POA: Diagnosis not present

## 2022-09-01 DIAGNOSIS — E876 Hypokalemia: Secondary | ICD-10-CM | POA: Diagnosis not present

## 2022-09-01 DIAGNOSIS — I10 Essential (primary) hypertension: Secondary | ICD-10-CM | POA: Diagnosis not present

## 2022-09-01 DIAGNOSIS — F320 Major depressive disorder, single episode, mild: Secondary | ICD-10-CM | POA: Diagnosis not present

## 2022-09-01 DIAGNOSIS — Z85828 Personal history of other malignant neoplasm of skin: Secondary | ICD-10-CM | POA: Diagnosis not present

## 2022-09-01 DIAGNOSIS — K581 Irritable bowel syndrome with constipation: Secondary | ICD-10-CM | POA: Diagnosis not present

## 2022-09-10 DIAGNOSIS — N200 Calculus of kidney: Secondary | ICD-10-CM | POA: Diagnosis not present

## 2022-09-14 DIAGNOSIS — K5902 Outlet dysfunction constipation: Secondary | ICD-10-CM | POA: Diagnosis not present

## 2022-09-14 DIAGNOSIS — K5909 Other constipation: Secondary | ICD-10-CM | POA: Diagnosis not present

## 2022-09-19 DIAGNOSIS — M6289 Other specified disorders of muscle: Secondary | ICD-10-CM | POA: Diagnosis not present

## 2022-09-19 DIAGNOSIS — K5902 Outlet dysfunction constipation: Secondary | ICD-10-CM | POA: Diagnosis not present

## 2022-09-20 DIAGNOSIS — M6289 Other specified disorders of muscle: Secondary | ICD-10-CM | POA: Diagnosis not present

## 2022-09-20 DIAGNOSIS — M62838 Other muscle spasm: Secondary | ICD-10-CM | POA: Diagnosis not present

## 2022-09-20 DIAGNOSIS — K5900 Constipation, unspecified: Secondary | ICD-10-CM | POA: Diagnosis not present

## 2022-09-20 DIAGNOSIS — M6281 Muscle weakness (generalized): Secondary | ICD-10-CM | POA: Diagnosis not present

## 2022-10-02 DIAGNOSIS — Z0000 Encounter for general adult medical examination without abnormal findings: Secondary | ICD-10-CM | POA: Diagnosis not present

## 2022-10-02 DIAGNOSIS — G932 Benign intracranial hypertension: Secondary | ICD-10-CM | POA: Diagnosis not present

## 2022-10-02 DIAGNOSIS — Z13220 Encounter for screening for lipoid disorders: Secondary | ICD-10-CM | POA: Diagnosis not present

## 2022-10-02 DIAGNOSIS — N200 Calculus of kidney: Secondary | ICD-10-CM | POA: Diagnosis not present

## 2022-10-02 DIAGNOSIS — K5900 Constipation, unspecified: Secondary | ICD-10-CM | POA: Diagnosis not present

## 2022-10-05 DIAGNOSIS — K5900 Constipation, unspecified: Secondary | ICD-10-CM | POA: Diagnosis not present

## 2022-10-05 DIAGNOSIS — N3941 Urge incontinence: Secondary | ICD-10-CM | POA: Diagnosis not present

## 2022-10-05 DIAGNOSIS — M6289 Other specified disorders of muscle: Secondary | ICD-10-CM | POA: Diagnosis not present

## 2022-10-05 DIAGNOSIS — M62838 Other muscle spasm: Secondary | ICD-10-CM | POA: Diagnosis not present

## 2022-10-05 DIAGNOSIS — M6281 Muscle weakness (generalized): Secondary | ICD-10-CM | POA: Diagnosis not present

## 2022-10-17 DIAGNOSIS — D225 Melanocytic nevi of trunk: Secondary | ICD-10-CM | POA: Diagnosis not present

## 2022-10-17 DIAGNOSIS — L821 Other seborrheic keratosis: Secondary | ICD-10-CM | POA: Diagnosis not present

## 2022-10-17 DIAGNOSIS — L814 Other melanin hyperpigmentation: Secondary | ICD-10-CM | POA: Diagnosis not present

## 2022-10-17 DIAGNOSIS — D2261 Melanocytic nevi of right upper limb, including shoulder: Secondary | ICD-10-CM | POA: Diagnosis not present

## 2022-10-17 DIAGNOSIS — L301 Dyshidrosis [pompholyx]: Secondary | ICD-10-CM | POA: Diagnosis not present

## 2022-10-17 DIAGNOSIS — D2272 Melanocytic nevi of left lower limb, including hip: Secondary | ICD-10-CM | POA: Diagnosis not present

## 2022-10-17 DIAGNOSIS — L819 Disorder of pigmentation, unspecified: Secondary | ICD-10-CM | POA: Diagnosis not present

## 2022-11-08 ENCOUNTER — Telehealth: Payer: Self-pay

## 2022-11-08 NOTE — Telephone Encounter (Signed)
*  [facility]  Pharmacy Patient Advocate Encounter   Received notification from CoverMyMeds that prior authorization for Nurtec 75MG  dispersible tablets  is required/requested.   Insurance verification completed.   The patient is insured through [facility] .   Per test claim: PA required; PA submitted to [facility] via CoverMyMeds Key/confirmation #/EOC [id number] Status is pending

## 2022-11-17 NOTE — Telephone Encounter (Signed)
Pharmacy Patient Advocate Encounter  Received notification from [facility] that Prior Authorization for Nurtec 75MG  dispersible tablets has been APPROVED from 11/09/2022 to 11/08/2023  PA #/Case ID/Reference #: PA Case ID #: [id number]

## 2022-11-28 DIAGNOSIS — K219 Gastro-esophageal reflux disease without esophagitis: Secondary | ICD-10-CM | POA: Diagnosis not present

## 2022-11-28 DIAGNOSIS — M6289 Other specified disorders of muscle: Secondary | ICD-10-CM | POA: Diagnosis not present

## 2022-11-28 DIAGNOSIS — K581 Irritable bowel syndrome with constipation: Secondary | ICD-10-CM | POA: Diagnosis not present

## 2022-12-29 DIAGNOSIS — B3731 Acute candidiasis of vulva and vagina: Secondary | ICD-10-CM | POA: Diagnosis not present

## 2023-03-05 DIAGNOSIS — Z880 Allergy status to penicillin: Secondary | ICD-10-CM | POA: Diagnosis not present

## 2023-03-05 DIAGNOSIS — J45909 Unspecified asthma, uncomplicated: Secondary | ICD-10-CM | POA: Diagnosis not present

## 2023-03-05 DIAGNOSIS — Z886 Allergy status to analgesic agent status: Secondary | ICD-10-CM | POA: Diagnosis not present

## 2023-03-05 DIAGNOSIS — I87309 Chronic venous hypertension (idiopathic) without complications of unspecified lower extremity: Secondary | ICD-10-CM | POA: Diagnosis not present

## 2023-03-05 DIAGNOSIS — Z85831 Personal history of malignant neoplasm of soft tissue: Secondary | ICD-10-CM | POA: Diagnosis not present

## 2023-03-05 DIAGNOSIS — Z809 Family history of malignant neoplasm, unspecified: Secondary | ICD-10-CM | POA: Diagnosis not present

## 2023-03-05 DIAGNOSIS — Z818 Family history of other mental and behavioral disorders: Secondary | ICD-10-CM | POA: Diagnosis not present

## 2023-03-05 DIAGNOSIS — K581 Irritable bowel syndrome with constipation: Secondary | ICD-10-CM | POA: Diagnosis not present

## 2023-03-05 DIAGNOSIS — Z87891 Personal history of nicotine dependence: Secondary | ICD-10-CM | POA: Diagnosis not present

## 2023-03-05 DIAGNOSIS — I10 Essential (primary) hypertension: Secondary | ICD-10-CM | POA: Diagnosis not present

## 2023-03-05 DIAGNOSIS — K219 Gastro-esophageal reflux disease without esophagitis: Secondary | ICD-10-CM | POA: Diagnosis not present

## 2023-03-05 DIAGNOSIS — G40909 Epilepsy, unspecified, not intractable, without status epilepticus: Secondary | ICD-10-CM | POA: Diagnosis not present

## 2023-03-08 IMAGING — CT CT ABD-PELV W/ CM
2 of 4 series · 13 of 46 positions shown, 15 images · IV contrast (iopamidol)
Comparison: None.

CLINICAL DATA: Lower pelvic pain, worsening for the past couple
weeks, history of C-section and hernia repair

EXAM:
CT ABDOMEN AND PELVIS WITH CONTRAST
TECHNIQUE: Multidetector CT imaging of the abdomen and pelvis was performed
using the standard protocol following bolus administration of
intravenous contrast.
CONTRAST:  100mL [id number] IOPAMIDOL ([id number]) INJECTION 61%

[Series 2: abd pelvis 5.00 br40 s3 axial · axial · 0.64mm/px · z∈[+1332,+1697]mm · 10 of 91 slices shown, 12 images]
[im 9/91  soft-tissue]
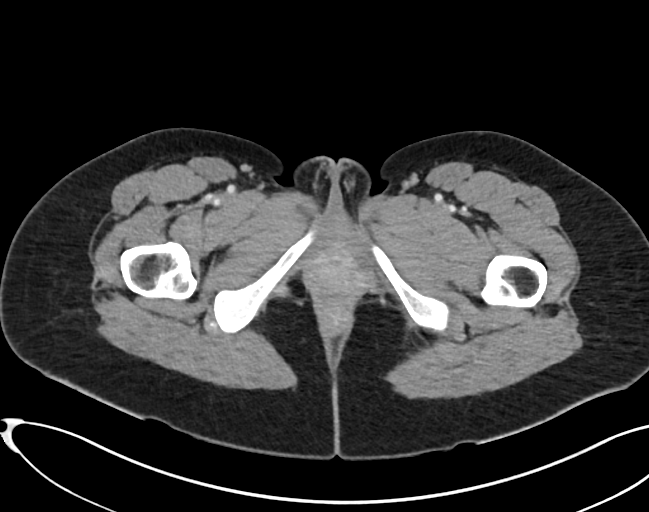
[im 9/91  bone]
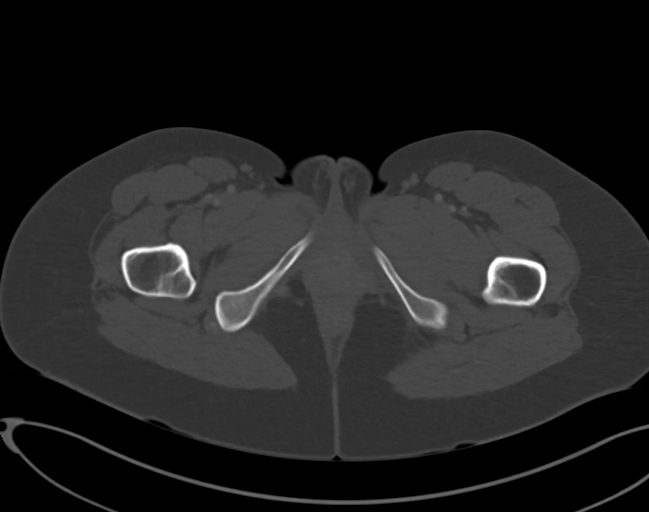
[im 17/91  soft-tissue]
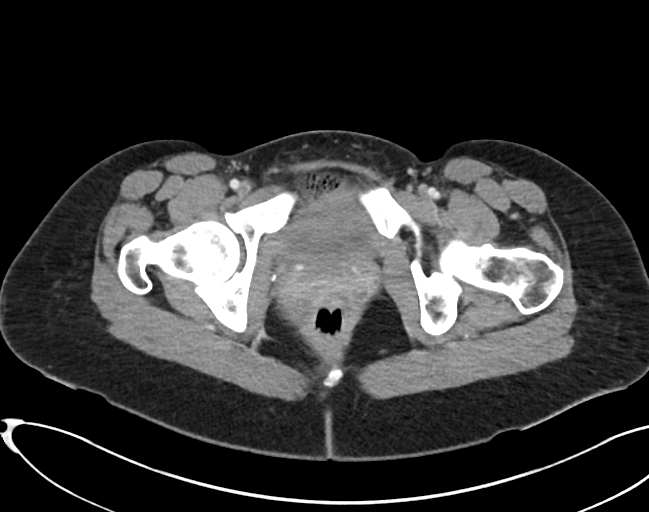
[im 25/91  soft-tissue]
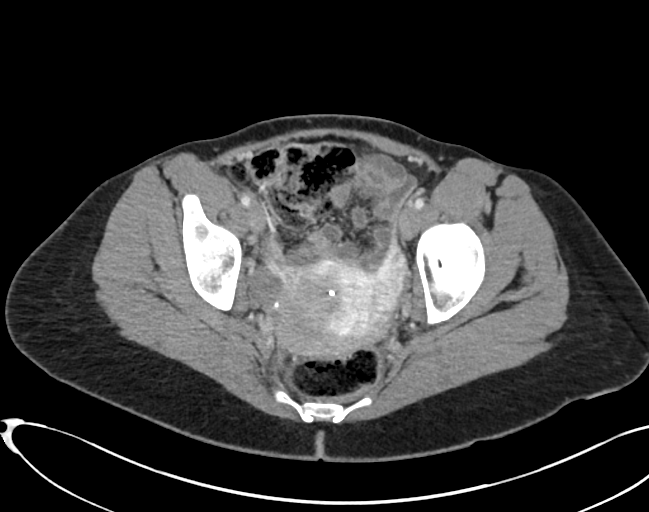
[im 33/91  soft-tissue]
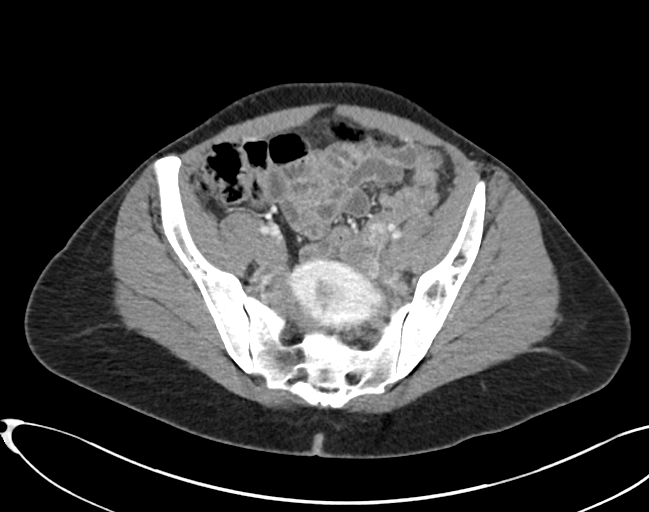
[im 41/91  soft-tissue]
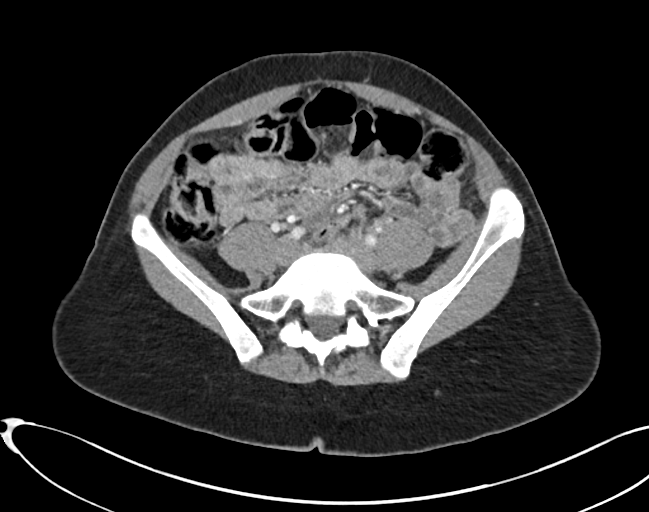
[im 50/91  soft-tissue]
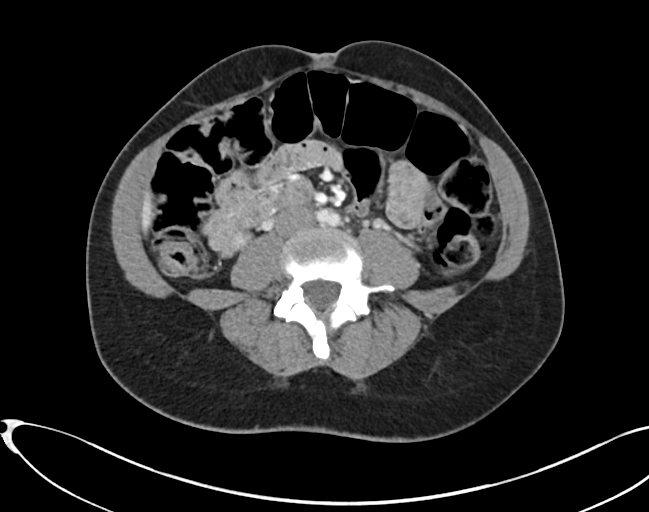
[im 58/91  soft-tissue]
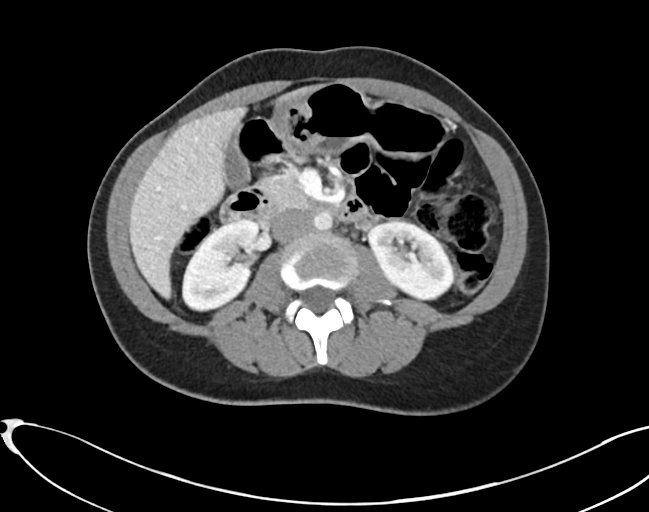
[im 66/91  soft-tissue]
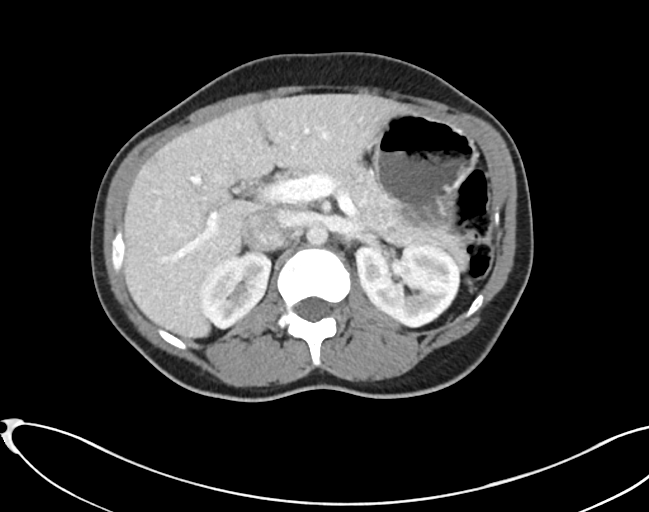
[im 74/91  soft-tissue]
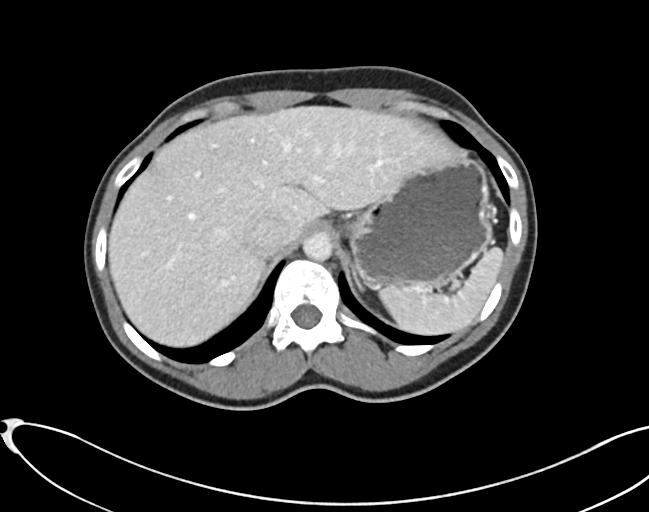
[im 74/91  bone]
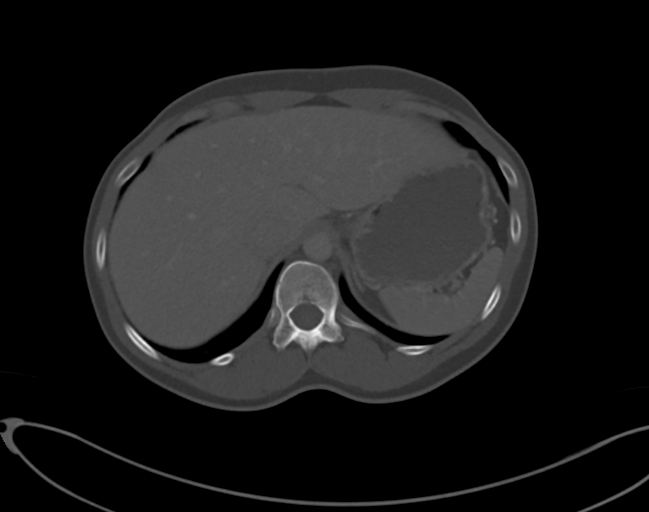
[im 82/91  soft-tissue]
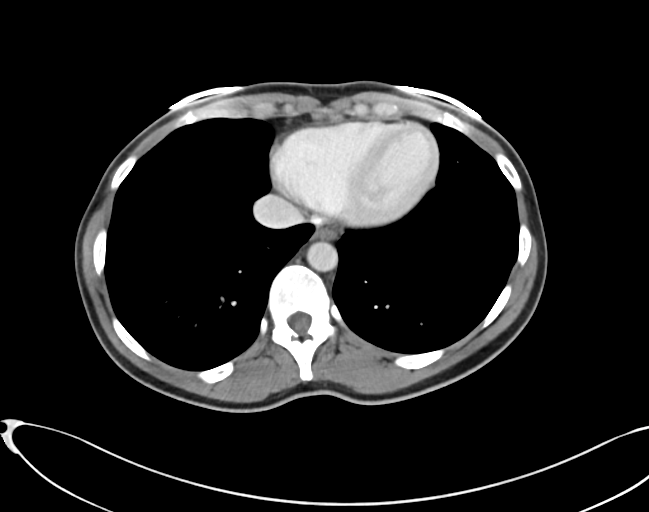

[Series 6: abd pelvis 2.00 br40 s3 cor · coronal · 0.81mm/px · 3 of 136 slices shown]
[im 46/136  soft-tissue]
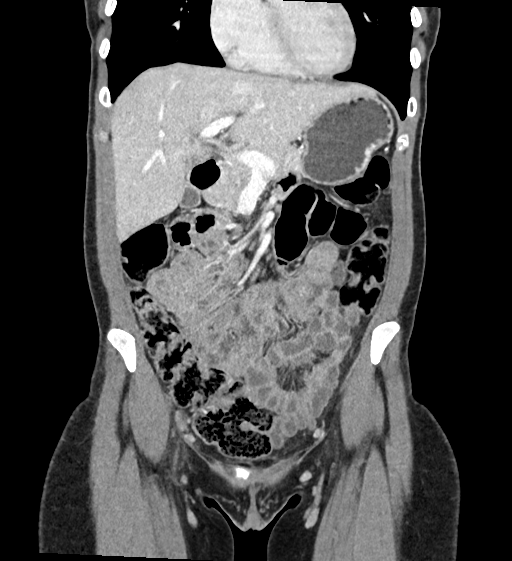
[im 61/136  soft-tissue]
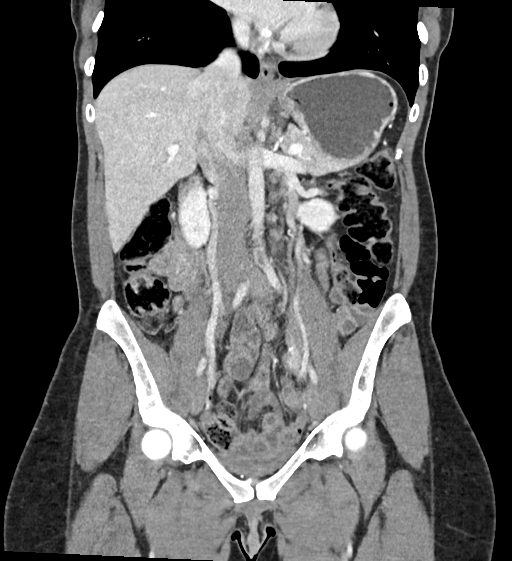
[im 76/136  soft-tissue]
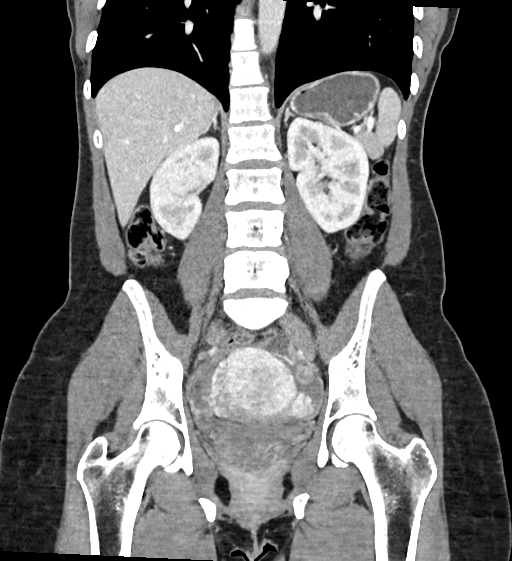

[13 of 46 positions shown; findings below may reference images not displayed]

FINDINGS: Lower chest: No acute abnormality.

Hepatobiliary: No solid liver abnormality is seen. Subcapsular cyst
or hemangioma of the anterior right lobe of the liver (series 2,
image 27). No gallstones, gallbladder wall thickening, or biliary
dilatation.

Pancreas: Unremarkable. No pancreatic ductal dilatation or
surrounding inflammatory changes.

Spleen: Normal in size without significant abnormality.

Adrenals/Urinary Tract: Adrenal glands are unremarkable.
Nonobstructive calculus of the anterior midportion of the left
kidney (series 2, image 28). Bladder is unremarkable.

Stomach/Bowel: Stomach is within normal limits. Appendix appears
normal. No evidence of bowel wall thickening, distention, or
inflammatory changes.

Vascular/Lymphatic: No significant vascular findings are present. No
enlarged abdominal or pelvic lymph nodes.

Reproductive: IUD present in the endometrial cavity. Very boggy and
hypoenhancing appearance of the lower uterine segment and cervix
(series 8, image 104, series 2, image 70).

Other: No abdominal wall hernia or abnormality. Small volume free
fluid in the low pelvis.

Musculoskeletal: No acute or significant osseous findings.
IMPRESSION: 1. Very boggy and hypoenhancing appearance of the lower uterine
segment and cervix. Findings are concerning for cervical or lower
uterine pathology. Correlated with bimanual examination and consider
pelvic ultrasound to further evaluate.
2. Small volume free fluid in the low pelvis, nonspecific.
3. Nonobstructive left nephrolithiasis.

## 2023-04-03 NOTE — Patient Instructions (Incomplete)
 Below is our plan:  We will restart acetazolamide, topiramate, gabapentin and Nurtec. Take 1/2 dose prescribed for 2 weeks then increase as tolerated. Schedule eye exam asap.   I advise against pregnancy on   Please make sure you are staying well hydrated. I recommend 50-60 ounces daily. Well balanced diet and regular exercise encouraged. Consistent sleep schedule with 6-8 hours recommended.   Please continue follow up with care team as directed.   Follow up with me in 6-12 months pending response to restarting meds.   You may receive a survey regarding today's visit. I encourage you to leave honest feed back as I do use this information to improve patient care. Thank you for seeing me today!   GENERAL HEADACHE INFORMATION:   Natural supplements: Magnesium Oxide or Magnesium Glycinate 500 mg at bed (up to 800 mg daily) Coenzyme Q10 300 mg in AM Vitamin B2- 200 mg twice a day   Add 1 supplement at a time since even natural supplements can have undesirable side effects. You can sometimes buy supplements cheaper (especially Coenzyme Q10) at www.WebmailGuide.co.za or at [facility].  Migraine with aura: There is increased risk for stroke in women with migraine with aura and a contraindication for the combined contraceptive pill for use by women who have migraine with aura. The risk for women with migraine without aura is lower. However other risk factors like smoking are far more likely to increase stroke risk than migraine. There is a recommendation for no smoking and for the use of OCPs without estrogen such as progestogen only pills particularly for women with migraine with aura.Marland Kitchen People who have migraine headaches with auras may be 3 times more likely to have a stroke caused by a blood clot, compared to migraine patients who don't see auras. Women who take hormone-replacement therapy may be 30 percent more likely to suffer a clot-based stroke than women not taking medication containing estrogen. Other  risk factors like smoking and high blood pressure may be  much more important.    Vitamins and herbs that show potential:   Magnesium: Magnesium (250 mg twice a day or 500 mg at bed) has a relaxant effect on smooth muscles such as blood vessels. Individuals suffering from frequent or daily headache usually have low magnesium levels which can be increase with daily supplementation of 400-750 mg. Three trials found 40-90% average headache reduction  when used as a preventative. Magnesium may help with headaches are aura, the best evidence for magnesium is for migraine with aura is its thought to stop the cortical spreading depression we believe is the pathophysiology of migraine aura.Magnesium also demonstrated the benefit in menstrually related migraine.  Magnesium is part of the messenger system in the serotonin cascade and it is a good muscle relaxant.  It is also useful for constipation which can be a side effect of other medications used to treat migraine. Good sources include nuts, whole grains, and tomatoes. Side Effects: loose stool/diarrhea  Riboflavin (vitamin B 2) 200 mg twice a day. This vitamin assists nerve cells in the production of ATP a principal energy storing molecule.  It is necessary for many chemical reactions in the body.  There have been at least 3 clinical trials of riboflavin using 400 mg per day all of which suggested that migraine frequency can be decreased.  All 3 trials showed significant improvement in over half of migraine sufferers.  The supplement is found in bread, cereal, milk, meat, and poultry.  Most Americans get more riboflavin  than the recommended daily allowance, however riboflavin deficiency is not necessary for the supplements to help prevent headache. Side effects: energizing, green urine   Coenzyme Q10: This is present in almost all cells in the body and is critical component for the conversion of energy.  Recent studies have shown that a nutritional supplement of  CoQ10 can reduce the frequency of migraine attacks by improving the energy production of cells as with riboflavin.  Doses of 150 mg twice a day have been shown to be effective.   Melatonin: Increasing evidence shows correlation between melatonin secretion and headache conditions.  Melatonin supplementation has decreased headache intensity and duration.  It is widely used as a sleep aid.  Sleep is natures way of dealing with migraine.  A dose of 3 mg is recommended to start for headaches including cluster headache. Higher doses up to 15 mg has been reviewed for use in Cluster headache and have been used. The rationale behind using melatonin for cluster is that many theories regarding the cause of Cluster headache center around the disruption of the normal circadian rhythm in the brain.  This helps restore the normal circadian rhythm.   HEADACHE DIET: Foods and beverages which may trigger migraine Note that only 20% of headache patients are food sensitive. You will know if you are food sensitive if you get a headache consistently 20 minutes to 2 hours after eating a certain food. Only cut out a food if it causes headaches, otherwise you might remove foods you enjoy! What matters most for diet is to eat a well balanced healthy diet full of vegetables and low fat protein, and to not miss meals.   Chocolate, other sweets ALL cheeses except cottage and cream cheese Dairy products, yogurt, sour cream, ice cream Liver Meat extracts (Bovril, Marmite, meat tenderizers) Meats or fish which have undergone aging, fermenting, pickling or smoking. These include: Hotdogs,salami,Lox,sausage, mortadellas,smoked salmon, pepperoni, Pickled herring Pods of broad bean (English beans, Chinese pea pods, [place] (fava) beans, lima and navy beans Ripe avocado, ripe banana Yeast extracts or active yeast preparations such as Brewer's or Fleishman's (commercial bakes goods are permitted) Tomato based foods, pizza (lasagna,  etc.)   MSG (monosodium glutamate) is disguised as many things; look for these common aliases: Monopotassium glutamate Autolysed yeast Hydrolysed protein Sodium caseinate "flavorings" "all natural preservatives" Nutrasweet   Avoid all other foods that convincingly provoke headaches.   Resources: The Dizzy [place] Laundry Your Headache Diet, migrainestrong.com  https://zamora-andrews.com/   Caffeine and Migraine For patients that have migraine, caffeine intake more than 3 days per week can lead to dependency and increased migraine frequency. I would recommend cutting back on your caffeine intake as best you can. The recommended amount of caffeine is 200-300 mg daily, although migraine patients may experience dependency at even lower doses. While you may notice an increase in headache temporarily, cutting back will be helpful for headaches in the long run. For more information on caffeine and migraine, visit: https://americanmigrainefoundation.org/resource-library/caffeine-and-migraine/   Headache Prevention Strategies:   1. Maintain a headache diary; learn to identify and avoid triggers.  - This can be a simple note where you log when you had a headache, associated symptoms, and medications used - There are several smartphone apps developed to help track migraines: Migraine Buddy, Migraine Monitor, Curelator N1-Headache App   Common triggers include: Emotional triggers: Emotional/Upset family or friends Emotional/Upset occupation Business reversal/success Anticipation anxiety Crisis-serious Post-crisis periodNew job/position   Physical triggers: Vacation Day Weekend Strenuous Exercise High Altitude  Location New Move Menstrual Day Physical Illness Oversleep/Not enough sleep Weather changes Light: Photophobia or light sesnitivity treatment involves a balance between desensitization and reduction in overly strong input. Use dark  polarized glasses outside, but not inside. Avoid bright or fluorescent light, but do not dim environment to the point that going into a normally lit room hurts. Consider FL-41 tint lenses, which reduce the most irritating wavelengths without blocking too much light.  These can be obtained at axonoptics.com or theraspecs.com Foods: see list above.   2. Limit use of acute treatments (over-the-counter medications, triptans, etc.) to no more than 2 days per week or 10 days per month to prevent medication overuse headache (rebound headache).     3. Follow a regular schedule (including weekends and holidays): Don't skip meals. Eat a balanced diet. 8 hours of sleep nightly. Minimize stress. Exercise 30 minutes per day. Being overweight is associated with a 5 times increased risk of chronic migraine. Keep well hydrated and drink 6-8 glasses of water per day.   4. Initiate non-pharmacologic measures at the earliest onset of your headache. Rest and quiet environment. Relax and reduce stress. Breathe2Relax is a free app that can instruct you on    some simple relaxtion and breathing techniques. Http://Dawnbuse.com is a    free website that provides teaching videos on relaxation.  Also, there are  many apps that   can be downloaded for "mindful" relaxation.  An app called YOGA NIDRA will help walk you through mindfulness. Another app called Calm can be downloaded to give you a structured mindfulness guide with daily reminders and skill development. Headspace for guided meditation Mindfulness Based Stress Reduction Online Course: www.palousemindfulness.com Cold compresses.   5. Don't wait!! Take the maximum allowable dosage of prescribed medication at the first sign of migraine.   6. Compliance:  Take prescribed medication regularly as directed and at the first sign of a migraine.   7. Communicate:  Call your physician when problems arise, especially if your headaches change, increase in frequency/severity,  or become associated with neurological symptoms (weakness, numbness, slurred speech, etc.). Proceed to emergency room if you experience new or worsening symptoms or symptoms do not resolve, if you have new neurologic symptoms or if headache is severe, or for any concerning symptom.   8. Headache/pain management therapies: Consider various complementary methods, including medication, behavioral therapy, psychological counselling, biofeedback, massage therapy, acupuncture, dry needling, and other modalities.  Such measures may reduce the need for medications. Counseling for pain management, where patients learn to function and ignore/minimize their pain, seems to work very well.   9. Recommend changing family's attention and focus away from patient's headaches. Instead, emphasize daily activities. If first question of day is 'How are your headaches/Do you have a headache today?', then patient will constantly think about headaches, thus making them worse. Goal is to re-direct attention away from headaches, toward daily activities and other distractions.   10. Helpful Websites: www.AmericanHeadacheSociety.org PatentHood.ch www.headaches.org TightMarket.nl www.achenet.org

## 2023-04-03 NOTE — Progress Notes (Unsigned)
 PATIENT: [name] DOB: 1980-08-16  REASON FOR VISIT: follow up HISTORY FROM: patient  No chief complaint on file.   HISTORY OF PRESENT ILLNESS:  04/03/23 ALL:  [name] returns for follow up for IIH. She was last seen 03/2022 and doing well on Nurtec every other day, acetazolimide 1000mg , topiramate 100mg  and gabapentin 200mg  at bedtime. Since,   03/29/2022 ALL:  [name] returns for follow up for IIH. She continues Nurtec QOD, acetazolimide 1000mg , topiramate 100mg  and gabapentin 200mg  at bedtime. She reports headaches have improved. She has not had a migraine recently. Maybe 1 every other month. She does have milder headaches that are relieved with Tylenol. She reports recent eye exam was normal. She has some blurred vision with headache but does not have any vision loss. She is drinking water regularly. She continues work a swing shift. 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 She has a headache today due to sleep being disrupted. Rest and Tylenol abort headache.   09/13/2021 ALL: [name] returns for follow up for IIH. She continues acetazolamide 1000mg , topiramate 100mg  and gabapentin 200mg  at bedtime. Nurtec used for abortive therapy. She is having mild headaches 6-7 times a month. She is having migrainous headaches about 4 times a month. Nurtec works well. She reports last eye exam was at least 2 years ago. No vision changes or TOV. She works swing shifts. 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 She knows that diet and hydration are triggers. She usually drinks 120 ounces of water daily. She has gained about 10lbs over the past year. She does not sleep well. She is having continued concerns of fatigue and brain fog. She has trouble with word finding.   09/13/2020 ALL: [name] returns for annual follow up for IIH. She continues acetazolamide 1000mg  daily (couldn't remember BID dosing), topiramate 200mg  daily and gabapentin 200mg  at bedtime. Nurtec used for abortive therapy. She feels this works better than  naratriptan. She was doing very well with rare headaches but reports being out of gabapentin for about 6 months (didn't request refills) and off topiramate and acetazolamide for the past two weeks (pharmacy back order). When taking current regimen as prescribed she feels headaches are well managed. No vision changes. Last eye exam about a year ago.   09/15/2019 ALL:  [name] is a 43 y.o. female here today for follow up of IIH. She continues Diamox 750mg  BID, topiramate 200mg  and gabapentin 200mg  at bedtime. She uses eletriptan for abortive therapy. She denies any concerns of headaches. She may have a mild headache from time to time. She is doing well. She reports from time to time, she will have twitching of her eye but it resolves spontaneously. She stays well hydrated. She was last seen by ophthalmology in 03/2019. She does not have scheduled follow up.   03/17/2019 ALL: [name] is a 43 y.o. female here today for follow up of IIH. She continues Diamox 750mg  twice daily, topiramate 200mg  and gabapentin 200mg  at bedtime. She reports that she has done well until last week. She started having a mild headache about 7 days ago. Headache was centered at the top of her head. Pain was dull aching pain. No light or sound sensitivity but she did have intermittent nausea. Vision was blurry when headache worsened but no vision loss. She reports that headache progressed for 3-4 days. She took one dose of eletriptan on Friday and two doses on Saturday. Sunday the headache was improved. Today headache is better. Blurry vision is significantly improved.  She does report allergy symptoms as well. She has not taken allergy medication. She is not taking Excedrin. She is eating well and drinking plenty of water.    01/16/2019 ALL: [name] is a 43 y.o. female here today for follow up of IIH. She continues Diamox 750mg  twice daily, topiramate 200mg  and gabapentin 200mg  at bedtime daily. She reports that headaches  did improve with increased dose of Diamox. About three days ago, she felt a headache starting. She has right sided pressure, worse behind right eye. She was seen yesterday by ER for acute intractable headache treated with metoclopramide and dexamethasone 10mg . She has noted some improvement today but headache continues. She did forget to tell UC provider yesterday that for a period of time, she felt as if she could only see black in the right outer vision fields. Vision is normal today with exception of mild blurriness with significant pain. She has felt nauseated as well and noticed light sensitivity driving to our appt today. She has not taken any OTC medications or Relpax as she was unsure if this would help. She is usually cautious about taking OTC medications. She uses Mirena for severe dysmenorrhea. We have discussed possible role of Mirena in worsening  IIH, however, data is conflicting. She is also followed very closely by Dr Charlotta Newton, gynecology. Patient feels benefit of controlled menstrual cycles outweighs risks of worsening IIH at this time.    12/03/2018 ALL: [name] is a 43 y.o. female here today for follow up for headaches with new diagnosis of IIH. She has been seen several times in the ER for worsening headaches since being seen by me in 06/2018. At that time, she reported that headaches were doing well on topiramate and gabapentin. Relpax worked well for abortive therapy. She started having worsening of central headaches with pressure behind her eyes about 2-3 weeks ago. She reports that her headache was persistent and unresponsive to therapy. Pressure behind right eye with peripheral vision loss. Last week, LP showed opening pressure of 30. Closing pressure of 12 and headache improved. She was started on Diamox 500mg  twice daily and referred back to [place]. She has tolerated Diamox without adverse effects over the past 10 days. She has continued topiramate 200mg  and gabapentin 100mg  at bedtime.  MRI was normal. She has noticed more trouble with her memory. She feels that her words are not coming to her. She has ringing in her ears. She is nauseated at times. She has not had an eye exam. She has Mirena for birth control placed about a year ago. Twin sister was diagnosed with IIH about 10 years ago.      07/04/2018 ALL: [name] is a 43 y.o. female for follow up of migraines. She is taking topirimate 200mg  and gabapentin 100mg  at bedtime. She uses Relpax for abortive therapy. She does report sleepiness with Relpax. She has had about 3 migraines in the past year. She has 3-4 headaches per month. She feels that she is doing really well.      HISTORY (copied from [facility]'s note on 10/29/2017   Ms. Hahne is a 43 year old female with a history of migraine headaches.  She returns today for follow-up.  She states overall her migraine headaches are better.  She has not had a migraine in the last month.  She states that she is not headaches at least 3 times a week.  She typically can take Tylenol or Excedrin tension.  She reports that she does not use  Relpax as much because she needs to lay down after she takes it.  She states that she works different shifts at her job which also may be affecting her sleep.     HISTORY 04/19/17 Ms. Dust  is a 44 year old female with a history of migraine.  She is currently taking 200 mg at bedtime.  She uses Relpax to treat her acute migraines.  She reports that she has had a ongoing headache for the last 3 weeks.  She reports that her pain fluctuates from a 4 out of 10 to a 7 out of 10.  She reports that the headache has been constant.  She has used Tylenol as well as Relpax.  She reports that these medications has decreased the severity but essentially did not resolve the headache.  She reports the headache usually starts in the center of the head and radiates down to the forehead.  She reports with severe headaches she will have photophobia.  She reports  occasionally she will have halos in her vision.  She confirmed that she is also had nausea and vomiting.  She has been using Zofran.  In the past she has had a prednisone Dosepak for that it works well for her headache.  She returns today for evaluation.   REVIEW OF SYSTEMS: Out of a complete 14 system review of symptoms, the patient complains only of the following symptoms, headaches, fatigue and all other reviewed systems are negative.  ALLERGIES: Allergies  Allergen Reactions   Amoxicillin Hives, Nausea And Vomiting and Nausea Only    Has patient had a PCN reaction causing immediate rash, facial/tongue/throat swelling, SOB or lightheadedness with hypotension: Yes Has patient had a PCN reaction causing severe rash involving mucus membranes or skin necrosis: No Has patient had a PCN reaction that required hospitalization: No Has patient had a PCN reaction occurring within the last 10 years: No If all of the above answers are "NO", then may proceed with Cephalosporin use.    Aspirin Hives, Nausea And Vomiting and Nausea Only   Ibuprofen Hives, Nausea And Vomiting and Nausea Only   Adhesive [Tape] Rash and Other (See Comments)    CERTAIN BANDAGES BREAK OUT THE SKIN !!   Venlafaxine Hcl Palpitations    HOME MEDICATIONS: Outpatient Medications Prior to Visit  Medication Sig Dispense Refill   acetaminophen (TYLENOL) 500 MG tablet Take 1 tablet (500 mg total) by mouth every 6 (six) hours as needed. (Patient not taking: Reported on 03/13/2022) 30 tablet 0   acetaZOLAMIDE (DIAMOX) 250 MG tablet Take 4 tablets (1,000 mg total) by mouth daily. 360 tablet 3   albuterol (VENTOLIN HFA) 108 (90 Base) MCG/ACT inhaler Inhale 1-2 puffs into the lungs every 6 (six) hours as needed for wheezing or shortness of breath. 18 g 0   cetirizine (ZYRTEC) 10 MG tablet Take 10 mg by mouth daily as needed for allergies or rhinitis.     dicyclomine (BENTYL) 10 MG capsule Take 1 capsule (10 mg total) by mouth 3 (three)  times daily for 7 days. 21 capsule 0   gabapentin (NEURONTIN) 100 MG capsule Take 2 capsules (200 mg total) by mouth at bedtime. 180 capsule 3   hyoscyamine (ANASPAZ) 0.125 MG TBDP disintergrating tablet Take 0.125 mg by mouth every 4 (four) hours as needed for bladder spasms or cramping.     levonorgestrel (MIRENA) 20 MCG/24HR IUD 1 each by Intrauterine route once.     loratadine (CLARITIN) 10 MG tablet Take 10 mg by mouth daily  as needed for allergies or rhinitis.     ondansetron (ZOFRAN-ODT) 8 MG disintegrating tablet Take 8 mg by mouth 3 (three) times daily.     pantoprazole (PROTONIX) 20 MG tablet Take 1 tablet (20 mg total) by mouth daily. 30 tablet 0   pantoprazole (PROTONIX) 40 MG tablet Take 40 mg by mouth 2 (two) times daily.     Potassium Citrate 15 MEQ (1620 MG) TBCR Take 1 tablet by mouth 2 (two) times daily.     Rimegepant Sulfate (NURTEC) 75 MG TBDP Take 1 tablet (75 mg total) by mouth every other day. 45 tablet 3   topiramate (TOPAMAX) 100 MG tablet Take 1 tablet (100 mg total) by mouth at bedtime. 90 tablet 3   valACYclovir (VALTREX) 500 MG tablet Take 500 mg by mouth 2 (two) times daily as needed (as needed for lesions).     No facility-administered medications prior to visit.    PAST MEDICAL HISTORY: Past Medical History:  Diagnosis Date   Asthma    Cancer (HCC)    cancer in my leg   Common migraine with intractable migraine 05/11/2016   Hypertension    Migraines    PCOS (polycystic ovarian syndrome)    Reactive airway disease    Seizures (HCC)    c/b anesthesia    PAST SURGICAL HISTORY: Past Surgical History:  Procedure Laterality Date   CESAREAN SECTION     one previous   HERNIA REPAIR     NASAL RECONSTRUCTION      FAMILY HISTORY: Family History  Problem Relation Age of Onset   Cancer Mother    Kidney cancer Mother    Colon cancer Father    Diabetes Maternal Grandmother    Diabetes Paternal Grandfather    Breast cancer Maternal Aunt    Cancer  Maternal Aunt        ovarian    SOCIAL HISTORY: Social History   Socioeconomic History   Marital status: Married    Spouse name: Not on file   Number of children: Not on file   Years of education: Not on file   Highest education level: Not on file  Occupational History   Not on file  Tobacco Use   Smoking status: Never   Smokeless tobacco: Never  Substance and Sexual Activity   Alcohol use: Yes    Comment: occasionally   Drug use: No   Sexual activity: Yes    Birth control/protection: I.U.D.  Other Topics Concern   Not on file  Social History Narrative   Lives at home with husband and son   Right handed   Caffeine: 1 cup/day (coffee or tea)   Social Drivers of Corporate investment banker Strain: Not on file  Food Insecurity: Low Risk  (11/28/2022)   Received from [facility]   Hunger Vital Sign    Worried About Running Out of Food in the Last Year: Never true    Ran Out of Food in the Last Year: Never true  Transportation Needs: No Transportation Needs (11/28/2022)   Received from [facility]    In the past 12 months, has lack of reliable transportation kept you from medical appointments, meetings, work or from getting things needed for daily living? : No  Physical Activity: Not on file  Stress: Not on file  Social Connections: Not on file  Intimate Partner Violence: Not on file      PHYSICAL EXAM  There were no vitals filed for this  visit.     There is no height or weight on file to calculate BMI.  Generalized: Well developed, in no acute distress  Cardiology: normal rate and rhythm, no murmur noted Respiratory: clear to auscultation bilaterally  Neurological examination  Mentation: Alert oriented to time, place, history taking. Follows all commands speech and language fluent Cranial nerve II-XII: Pupils were equal round reactive to light. Extraocular movements were full, visual field were full on confrontational test. Facial  sensation and strength were normal. Head turning and shoulder shrug  were normal and symmetric. Motor: The motor testing reveals 5 over 5 strength of all 4 extremities. Good symmetric motor tone is noted throughout.  Gait and station: Gait is normal.    DIAGNOSTIC DATA (LABS, IMAGING, TESTING) - I reviewed patient records, labs, notes, testing and imaging myself where available.      No data to display           Lab Results  Component Value Date   WBC 5.2 03/13/2022   HGB 13.8 03/13/2022   HCT 43.4 03/13/2022   MCV 96.0 03/13/2022   PLT 243 03/13/2022      Component Value Date/Time   NA 138 03/13/2022 1948   K 4.3 03/13/2022 1948   CL 115 (H) 03/13/2022 1948   CO2 20 (L) 03/13/2022 1948   GLUCOSE 100 (H) 03/13/2022 1948   BUN 16 03/13/2022 1948   CREATININE 1.07 (H) 03/13/2022 1948   CALCIUM 9.0 03/13/2022 1948   PROT 7.0 03/14/2022 0030   ALBUMIN 4.2 03/14/2022 0030   AST 14 (L) 03/14/2022 0030   ALT 14 03/14/2022 0030   ALKPHOS 45 03/14/2022 0030   BILITOT 0.7 03/14/2022 0030   GFRNONAA >60 03/13/2022 1948   GFRAA >60 04/22/2019 1213   No results found for: "CHOL", "HDL", "LDLCALC", "LDLDIRECT", "TRIG", "CHOLHDL" No results found for: "HGBA1C" No results found for: "VITAMINB12" Lab Results  Component Value Date   TSH 0.241 (L) 05/19/2021    ASSESSMENT AND PLAN 43 y.o. year old female  has a past medical history of Asthma, Cancer (HCC), Common migraine with intractable migraine (05/11/2016), Hypertension, Migraines, PCOS (polycystic ovarian syndrome), Reactive airway disease, and Seizures (HCC). here with   No diagnosis found.   Liviah reports headaches are improved. We will continue acetazolimide 1000mg , topiramate 100mg  and gabapentin 200mg  daily. She will use Tylenol as needed for abortive therapy. She was encouraged continue annual follow up with ophthalmology. She was advised against pregnancy. She will continue healthy lifestyle habits. She was  encouraged to stay well hydrated. She will follow up in 1 year, sooner if needed. She verbalizes understanding and agreement with this plan.    No orders of the defined types were placed in this encounter.     No orders of the defined types were placed in this encounter.       Shawnie Dapper, FNP-C 04/03/2023, 9:10 AM [facility] [address] [place], [place] [address] [phone]

## 2023-04-04 ENCOUNTER — Encounter: Payer: Self-pay | Admitting: Family Medicine

## 2023-04-04 ENCOUNTER — Ambulatory Visit (INDEPENDENT_AMBULATORY_CARE_PROVIDER_SITE_OTHER): Payer: Medicaid Other | Admitting: Family Medicine

## 2023-04-04 VITALS — BP 130/85 | HR 84 | Ht 66.0 in | Wt 182.0 lb

## 2023-04-04 DIAGNOSIS — G932 Benign intracranial hypertension: Secondary | ICD-10-CM | POA: Diagnosis not present

## 2023-04-04 DIAGNOSIS — G43009 Migraine without aura, not intractable, without status migrainosus: Secondary | ICD-10-CM | POA: Diagnosis not present

## 2023-04-04 MED ORDER — TOPIRAMATE 100 MG PO TABS: 100.0000 mg | ORAL_TABLET | Freq: Every day | ORAL | 3 refills | Status: DC

## 2023-04-04 MED ORDER — NURTEC 75 MG PO TBDP: 75.0000 mg | ORAL_TABLET | ORAL | 3 refills | Status: DC

## 2023-04-04 MED ORDER — GABAPENTIN 100 MG PO CAPS: 200.0000 mg | ORAL_CAPSULE | Freq: Every day | ORAL | 3 refills | Status: DC

## 2023-04-04 MED ORDER — ACETAZOLAMIDE 250 MG PO TABS: 1000.0000 mg | ORAL_TABLET | Freq: Every day | ORAL | 3 refills | Status: DC

## 2023-04-06 DIAGNOSIS — Z975 Presence of (intrauterine) contraceptive device: Secondary | ICD-10-CM | POA: Diagnosis not present

## 2023-04-06 DIAGNOSIS — N939 Abnormal uterine and vaginal bleeding, unspecified: Secondary | ICD-10-CM | POA: Diagnosis not present

## 2023-04-06 DIAGNOSIS — N898 Other specified noninflammatory disorders of vagina: Secondary | ICD-10-CM | POA: Diagnosis not present

## 2023-04-17 DIAGNOSIS — J069 Acute upper respiratory infection, unspecified: Secondary | ICD-10-CM | POA: Diagnosis not present

## 2023-04-18 ENCOUNTER — Telehealth: Payer: Self-pay | Admitting: Pharmacy Technician

## 2023-04-18 ENCOUNTER — Other Ambulatory Visit (HOSPITAL_COMMUNITY): Payer: Self-pay

## 2023-04-18 NOTE — Telephone Encounter (Signed)
 Pharmacy Patient Advocate Encounter   Received notification from CoverMyMeds that prior authorization for Nurtec 75MG  dispersible tablets is required/requested.   Insurance verification completed.   The patient is insured through [facility] .   Per test claim: PA required; PA submitted to above mentioned insurance via CoverMyMeds Key/confirmation #/EOC [facility] Status is pending

## 2023-04-19 MED ORDER — QULIPTA 60 MG PO TABS: 60.0000 mg | ORAL_TABLET | Freq: Every day | ORAL | 3 refills | Status: DC

## 2023-04-19 NOTE — Telephone Encounter (Signed)
 Insurance prefers [name], [employer]chant [employer] officer, and qulipta,and pt must try and fail all formulary products.

## 2023-04-19 NOTE — Addendum Note (Signed)
 Addended by: Shawnie Dapper L on: 04/19/2023 03:49 PM   Modules accepted: Orders

## 2023-04-19 NOTE — Telephone Encounter (Signed)
 Pharmacy Patient Advocate Encounter  Received notification from [employer] that Prior Authorization for Nurtec 75MG  dispersible tablets  has been DENIED.  Full denial letter will be uploaded to the media tab. See denial reason below.   PA #/Case ID/Reference #: [id number]

## 2023-04-30 ENCOUNTER — Telehealth: Payer: Self-pay

## 2023-04-30 ENCOUNTER — Other Ambulatory Visit (HOSPITAL_COMMUNITY): Payer: Self-pay

## 2023-04-30 DIAGNOSIS — G43909 Migraine, unspecified, not intractable, without status migrainosus: Secondary | ICD-10-CM | POA: Diagnosis not present

## 2023-04-30 DIAGNOSIS — Z111 Encounter for screening for respiratory tuberculosis: Secondary | ICD-10-CM | POA: Diagnosis not present

## 2023-04-30 DIAGNOSIS — J45909 Unspecified asthma, uncomplicated: Secondary | ICD-10-CM | POA: Diagnosis not present

## 2023-04-30 DIAGNOSIS — R030 Elevated blood-pressure reading, without diagnosis of hypertension: Secondary | ICD-10-CM | POA: Diagnosis not present

## 2023-04-30 NOTE — Telephone Encounter (Signed)
 Pharmacy Patient Advocate Encounter   Received notification from CoverMyMeds that prior authorization for Qulipta 60MG  tablets is required/requested.   Insurance verification completed.   The patient is insured through [facility] .   Per test claim: PA required; PA submitted to above mentioned insurance via CoverMyMeds Key/confirmation #/EOC [id number] Status is pending

## 2023-05-01 ENCOUNTER — Encounter (HOSPITAL_BASED_OUTPATIENT_CLINIC_OR_DEPARTMENT_OTHER): Payer: Self-pay

## 2023-05-01 ENCOUNTER — Emergency Department (HOSPITAL_BASED_OUTPATIENT_CLINIC_OR_DEPARTMENT_OTHER)
Admission: EM | Admit: 2023-05-01 | Discharge: 2023-05-01 | Disposition: A | Discharge status: Home / Self Care | Attending: Emergency Medicine | Admitting: Emergency Medicine

## 2023-05-01 ENCOUNTER — Other Ambulatory Visit: Payer: Self-pay

## 2023-05-01 ENCOUNTER — Other Ambulatory Visit (HOSPITAL_COMMUNITY): Payer: Self-pay

## 2023-05-01 DIAGNOSIS — R519 Headache, unspecified: Secondary | ICD-10-CM | POA: Diagnosis not present

## 2023-05-01 DIAGNOSIS — I10 Essential (primary) hypertension: Secondary | ICD-10-CM | POA: Insufficient documentation

## 2023-05-01 DIAGNOSIS — R030 Elevated blood-pressure reading, without diagnosis of hypertension: Secondary | ICD-10-CM

## 2023-05-01 HISTORY — DX: Benign intracranial hypertension: G93.2

## 2023-05-01 LAB — HCG, QUANTITATIVE, PREGNANCY: hCG, Beta Chain, Quant, S: <1 m[IU]/mL (ref <5)

## 2023-05-01 LAB — BASIC METABOLIC PANEL
Anion gap: 7 (ref 5–15)
BUN: 14 mg/dL (ref 6–20)
CO2: 23 mmol/L (ref 22–32)
Calcium: 9 mg/dL (ref 8.9–10.3)
Chloride: 108 mmol/L (ref 98–111)
Creatinine, Ser: 0.91 mg/dL (ref 0.44–1.00)
GFR, Estimated: >60 mL/min (ref >60)
Glucose, Bld: 89 mg/dL (ref 70–99)
Potassium: 3.8 mmol/L (ref 3.5–5.1)
Sodium: 138 mmol/L (ref 135–145)

## 2023-05-01 LAB — CBC
HCT: 42.3 % (ref 36.0–46.0)
Hemoglobin: 14.1 g/dL (ref 12.0–15.0)
MCH: 31.3 pg (ref 26.0–34.0)
MCHC: 33.3 g/dL (ref 30.0–36.0)
MCV: 93.8 fL (ref 80.0–100.0)
Platelets: 279 10*3/uL (ref 150–400)
RBC: 4.51 MIL/uL (ref 3.87–5.11)
RDW: 13.4 % (ref 11.5–15.5)
WBC: 6 10*3/uL (ref 4.0–10.5)
nRBC: 0 % (ref 0.0–0.2)

## 2023-05-01 MED ORDER — DIPHENHYDRAMINE HCL 50 MG/ML IJ SOLN
25.0000 mg | Freq: Once | INTRAMUSCULAR | Status: AC
  Administered 2023-05-01: 25 mg via INTRAVENOUS
  Filled 2023-05-01: qty 1

## 2023-05-01 MED ORDER — DEXAMETHASONE SODIUM PHOSPHATE 10 MG/ML IJ SOLN
10.0000 mg | Freq: Once | INTRAMUSCULAR | Status: AC
  Administered 2023-05-01: 10 mg via INTRAVENOUS
  Filled 2023-05-01: qty 1

## 2023-05-01 MED ORDER — ACETAMINOPHEN 500 MG PO TABS
1000.0000 mg | ORAL_TABLET | Freq: Once | ORAL | Status: AC
  Administered 2023-05-01: 1000 mg via ORAL
  Filled 2023-05-01: qty 2

## 2023-05-01 MED ORDER — METOCLOPRAMIDE HCL 5 MG/ML IJ SOLN
10.0000 mg | Freq: Once | INTRAMUSCULAR | Status: AC
  Administered 2023-05-01: 10 mg via INTRAVENOUS
  Filled 2023-05-01: qty 2

## 2023-05-01 NOTE — ED Provider Notes (Signed)
 [place] EMERGENCY DEPARTMENT AT [facility] Provider Note   CSN: [id number] Arrival date & time: 05/01/23  1204     History  Chief Complaint  Patient presents with   Hypertension    [name] is a 43 y.o. female.   Hypertension   43 year old female presents emergency department complaints of elevated blood pressure, headache.  Reports headache beginning for the past 3 to 4 days.  Reports gradual onset with worsening since onset.  Describes headache as band type sensation around top of her head.  Patient recently had migraine medication switched to [facility] which she took yesterday without significant improvement but missed her dose today.  Reports blurry vision in both of her eyes but states that this has been present for the past several months.  Saw an optometrist and states that she was told she needed glasses but has not gotten them.  Denies any acute change since headache began 4 days ago.  Denies any weakness/sensory deficits in upper or lower extremities, slurred speech, facial droop, gait abnormalities, fever.  Patient also reports elevated blood pressure readings at home in the 160s systolic.  Has an at home blood pressure cuff.  Denies any chest pain, shortness of breath.  Past medical history significant for hypertension, IIH, migraine, PCOS, seizure  Home Medications Prior to Admission medications   Medication Sig Start Date End Date Taking? Authorizing Provider  acetaminophen (TYLENOL) 500 MG tablet Take 1 tablet (500 mg total) by mouth every 6 (six) hours as needed. Patient taking differently: Take 500 mg by mouth as needed. 11/14/21  Yes Redwine, Madison A, PA-C  acetaZOLAMIDE (DIAMOX) 250 MG tablet Take 4 tablets (1,000 mg total) by mouth daily. 04/04/23  Yes Lomax, Amy, NP  albuterol (VENTOLIN HFA) 108 (90 Base) MCG/ACT inhaler Inhale 1-2 puffs into the lungs every 6 (six) hours as needed for wheezing or shortness of breath. 01/02/21  Yes Aberman, Caroline  C, PA-C  Atogepant (QULIPTA) 60 MG TABS Take 1 tablet (60 mg total) by mouth daily. 04/19/23  Yes Lomax, Amy, NP  cetirizine (ZYRTEC) 10 MG tablet Take 10 mg by mouth daily as needed for allergies or rhinitis.   Yes [provider]  gabapentin (NEURONTIN) 100 MG capsule Take 2 capsules (200 mg total) by mouth at bedtime. 04/04/23  Yes Lomax, Amy, NP  hyoscyamine (ANASPAZ) 0.125 MG TBDP disintergrating tablet Take 0.125 mg by mouth every 4 (four) hours as needed for bladder spasms or cramping.   Yes [provider]  levonorgestrel (MIRENA) 20 MCG/24HR IUD 1 each by Intrauterine route once.   Yes [provider]  loratadine (CLARITIN) 10 MG tablet Take 10 mg by mouth daily as needed for allergies or rhinitis.   Yes [provider]  pantoprazole (PROTONIX) 40 MG tablet Take 40 mg by mouth 2 (two) times daily. 03/07/22  Yes [provider]  Potassium Citrate 15 MEQ (1620 MG) TBCR Take 1 tablet by mouth 2 (two) times daily. 03/07/22  Yes [provider]  topiramate (TOPAMAX) 100 MG tablet Take 1 tablet (100 mg total) by mouth at bedtime. 04/04/23  Yes Lomax, Amy, NP  valACYclovir (VALTREX) 500 MG tablet Take 500 mg by mouth as needed (as needed for lesions).   Yes [provider]  ondansetron (ZOFRAN-ODT) 8 MG disintegrating tablet Take 8 mg by mouth 3 (three) times daily. Patient not taking: Reported on 04/04/2023 03/13/22   [provider]      Allergies    Amoxicillin, Aspirin, Ibuprofen,  Adhesive [tape], and Venlafaxine hcl    Review of Systems   Review of Systems  All other systems reviewed and are negative.   Physical Exam Updated Vital Signs BP 124/85   Pulse (!) 58   Temp 98.2 F (36.8 C) (Oral)   Resp 14   Ht 5\' 6"  (1.676 m)   Wt 83.5 kg   SpO2 100%   BMI 29.70 kg/m  Physical Exam Vitals and nursing note reviewed.  Constitutional:      General: She is not in acute distress.    Appearance: She is well-developed.   HENT:     Head: Normocephalic and atraumatic.  Eyes:     Conjunctiva/sclera: Conjunctivae normal.  Cardiovascular:     Rate and Rhythm: Normal rate and regular rhythm.     Heart sounds: No murmur heard. Pulmonary:     Effort: Pulmonary effort is normal. No respiratory distress.     Breath sounds: Normal breath sounds.  Abdominal:     Palpations: Abdomen is soft.     Tenderness: There is no abdominal tenderness.  Musculoskeletal:        General: No swelling.     Cervical back: Normal range of motion and neck supple. No rigidity or tenderness.  Skin:    General: Skin is warm and dry.     Capillary Refill: Capillary refill takes less than 2 seconds.  Neurological:     Mental Status: She is alert.     Comments: Alert and oriented to self, place, time and event.   Speech is fluent, clear without dysarthria or dysphasia.   Strength 5/5 in upper/lower extremities   Sensation intact in upper/lower extremities   Normal gait.  CN I not tested  CN II not tested  CN III, IV, VI PERRLA and EOMs intact bilaterally  CN V Intact sensation to sharp and light touch to the face  CN VII facial movements symmetric  CN VIII not tested  CN IX, X no uvula deviation, symmetric rise of soft palate  CN XI 5/5 SCM and trapezius strength bilaterally  CN XII Midline tongue protrusion, symmetric L/R movements     Psychiatric:        Mood and Affect: Mood normal.     ED Results / Procedures / Treatments   Labs (all labs ordered are listed, but only abnormal results are displayed) Labs Reviewed  BASIC METABOLIC PANEL  CBC  HCG, QUANTITATIVE, PREGNANCY    EKG None  Radiology No results found.  Procedures Procedures    Medications Ordered in ED Medications  acetaminophen (TYLENOL) tablet 1,000 mg (1,000 mg Oral Given 05/01/23 1411)  metoCLOPramide (REGLAN) injection 10 mg (10 mg Intravenous Given 05/01/23 1414)  diphenhydrAMINE (BENADRYL) injection 25 mg (25 mg Intravenous Given  05/01/23 1412)  dexamethasone (DECADRON) injection 10 mg (10 mg Intravenous Given 05/01/23 1419)    ED Course/ Medical Decision Making/ A&P                                 Medical Decision Making Amount and/or Complexity of Data Reviewed Labs: ordered.  Risk OTC drugs. Prescription drug management.   This patient presents to the ED for concern of headache, elevated blood pressure, this involves an extensive number of treatment options, and is a complaint that carries with it a high risk of complications and morbidity.  The differential diagnosis includes CVA, cerebral venous thrombosis, IIH, migraine/tension/cluster headache, meningitis/encephalitis, AKI, ACS,  arrhythmia   Co morbidities that complicate the patient evaluation  See HPI   Additional history obtained:  Additional history obtained from EMR External records from outside source obtained and reviewed including hospital records   Lab Tests:  I Ordered, and personally interpreted labs.  The pertinent results include: No leukocytosis.  No evidence of anemia.  Platelets within range.  No Electra abnormalities.  No renal dysfunction.  hCG negative.   Imaging Studies ordered:  N/a   Cardiac Monitoring: / EKG:  The patient was maintained on a cardiac monitor.  I personally viewed and interpreted the cardiac monitored which showed an underlying rhythm of: sinus rhythm   Consultations Obtained:  N/a   Problem List / ED Course / Critical interventions / Medication management  Elevated blood pressure reading, headache I ordered medication including Tylenol, dexamethasone, Reglan, Benadryl  Reevaluation of the patient after these medicines showed that the patient improved I have reviewed the patients home medicines and have made adjustments as needed   Social Determinants of Health:  Denies tobacco, licit drug use.   Test / Admission - Considered:  Elevated blood pressure reading, headache Vitals signs  within normal range and stable throughout visit. Laboratory/imaging studies significant for: See above 43 year old female presents emergency department complaints of elevated blood pressure, headache.  Reports headache beginning for the past 3 to 4 days.  Reports gradual onset with worsening since onset.  Describes headache as band type sensation around top of her head.  Patient recently had migraine medication switched to [facility] which she took yesterday without significant improvement but missed her dose today.  Reports blurry vision in both of her eyes but states that this has been present for the past several months.  Saw an optometrist and states that she was told she needed glasses but has not gotten them.  Denies any acute change since headache began 4 days ago.  Denies any weakness/sensory deficits in upper or lower extremities, slurred speech, facial droop, gait abnormalities, fever.  Patient also reports elevated blood pressure readings at home in the 160s systolic.  Has an at home blood pressure cuff.  Denies any chest pain, shortness of breath. On exam, patient describing bandlike headache.  Nonfocal neuroexam.  No evidence clinically meningismus.  Patient with history of IIH but without any visual symptoms, retro-orbital headache at this time.  Reports compliance with her home medications.  Treated with migraine cocktail did note significant improvement of headache.  Per chart review, patient deals with chronic migraines with recent medication changes to [facility] and follows with neurology.  Working follow-up with neurology regarding headache.  Regarding hypertension, patient did have blood pressure readings in the 160s yesterday.  Patient's blood pressure decreased within normal range without any antihypertensive agent.  Will recommend continue logging blood pressure and follow-up with primary care; will not begin antihypertensive therapy at this time but will recommend lifestyle changes.  Treatment  plan discussed length with patient and she acknowledged understanding was agreeable to said plan.  Patient overall well-appearing, afebrile in no acute distress. Worrisome signs and symptoms were discussed with the patient, and the patient acknowledged understanding to return to the ED if noticed. Patient was stable upon discharge.          Final Clinical Impression(s) / ED Diagnoses Final diagnoses:  Elevated blood pressure reading  Acute nonintractable headache, unspecified headache type    Rx / DC Orders ED Discharge Orders     None  Peter Garter, [place] 05/01/23 1624    Rozelle Logan, DO 05/02/23 [phone]

## 2023-05-01 NOTE — Telephone Encounter (Signed)
 Pharmacy Patient Advocate Encounter  Received notification from [facility] that Prior Authorization for Qulipta 60MG  tablets has been APPROVED from 04/30/2023 to 07/31/2023. Ran test claim, Copay is $0. This test claim was processed through [facility]- copay amounts may vary at other pharmacies due to pharmacy/plan contracts, or as the patient moves through the different stages of their insurance plan.   PA #/Case ID/Reference #: PA Case ID #: [id number]

## 2023-05-01 NOTE — ED Triage Notes (Signed)
 In for eval of headache and elevated BP. PMH of migraines and IIH. Elevated BP readings at home, 164/104.

## 2023-05-01 NOTE — Discharge Instructions (Signed)
 As discussed, your workup today was overall reassuring.  Your labs were normal.  Will recommend logging your blood pressure once in the morning and once in the evening and following up with your primary care regarding blood pressure log.  Your blood pressure returned back to normal while in the emergency department so we will refrain from treating your blood pressure at this time.  Please do not hesitate to return if the worrisome signs and symptoms we discussed become apparent.

## 2023-07-03 DIAGNOSIS — M6289 Other specified disorders of muscle: Secondary | ICD-10-CM | POA: Diagnosis not present

## 2023-07-03 DIAGNOSIS — K581 Irritable bowel syndrome with constipation: Secondary | ICD-10-CM | POA: Diagnosis not present

## 2023-07-12 ENCOUNTER — Other Ambulatory Visit: Payer: Self-pay | Admitting: Obstetrics and Gynecology

## 2023-07-12 DIAGNOSIS — Z1231 Encounter for screening mammogram for malignant neoplasm of breast: Secondary | ICD-10-CM

## 2023-07-19 DIAGNOSIS — N921 Excessive and frequent menstruation with irregular cycle: Secondary | ICD-10-CM | POA: Diagnosis not present

## 2023-07-19 DIAGNOSIS — Z01419 Encounter for gynecological examination (general) (routine) without abnormal findings: Secondary | ICD-10-CM | POA: Diagnosis not present

## 2023-07-26 ENCOUNTER — Ambulatory Visit
Admission: RE | Admit: 2023-07-26 | Discharge: 2023-07-26 | Disposition: A | Discharge status: Home / Self Care | Source: Ambulatory Visit | Attending: Obstetrics and Gynecology | Admitting: Obstetrics and Gynecology

## 2023-07-26 DIAGNOSIS — Z1231 Encounter for screening mammogram for malignant neoplasm of breast: Secondary | ICD-10-CM | POA: Diagnosis not present

## 2023-08-01 ENCOUNTER — Telehealth: Payer: Self-pay | Admitting: Pharmacist

## 2023-08-01 ENCOUNTER — Other Ambulatory Visit (HOSPITAL_COMMUNITY): Payer: Self-pay

## 2023-08-01 NOTE — Telephone Encounter (Signed)
 Pharmacy Patient Advocate Encounter   Received notification from CoverMyMeds that prior authorization for Qulipta  60MG  tablets is required/requested.   Insurance verification completed.   The patient is insured through [facility] .   Per test claim: PA not needed at this time

## 2023-08-30 ENCOUNTER — Encounter: Payer: Self-pay | Admitting: Internal Medicine

## 2023-08-30 ENCOUNTER — Other Ambulatory Visit: Payer: Self-pay | Admitting: Internal Medicine

## 2023-08-30 DIAGNOSIS — M79629 Pain in unspecified upper arm: Secondary | ICD-10-CM

## 2023-09-03 ENCOUNTER — Ambulatory Visit
Admission: RE | Admit: 2023-09-03 | Discharge: 2023-09-03 | Disposition: A | Discharge status: Home / Self Care | Source: Ambulatory Visit | Attending: Internal Medicine | Admitting: Internal Medicine

## 2023-09-03 DIAGNOSIS — N644 Mastodynia: Secondary | ICD-10-CM | POA: Diagnosis not present

## 2023-09-03 DIAGNOSIS — R590 Localized enlarged lymph nodes: Secondary | ICD-10-CM | POA: Diagnosis not present

## 2023-09-03 DIAGNOSIS — M79629 Pain in unspecified upper arm: Secondary | ICD-10-CM

## 2023-09-10 DIAGNOSIS — N200 Calculus of kidney: Secondary | ICD-10-CM | POA: Diagnosis not present

## 2023-09-10 DIAGNOSIS — N130 Hydronephrosis with ureteropelvic junction obstruction: Secondary | ICD-10-CM | POA: Diagnosis not present

## 2023-10-12 NOTE — Progress Notes (Signed)
 PATIENT: [name] DOB: Aug 25, 1980  REASON FOR VISIT: follow up HISTORY FROM: patient  Chief Complaint  Patient presents with   RM1/MIGRAINES    Pt is here Alone. Pt states that her migraines have been okay. Pt states that she is getting some headaches.     HISTORY OF PRESENT ILLNESS:  10/15/23 ALL:  [name] returns for follow up for IIH and migraines. She was last seen 03/2023 and reported worsening headaches after stopping her medications. We restarted Nurtec every other day, acetazolimide 1000mg , topiramate  100mg  and gabapentin  200mg  daily. Nurtec was not covered and switched to Qulipta .   Since, she reports doing well. She reports getting glasses about 6 months ago that has helped. She has tolerated meds. She may have 3-4 milder tension headaches per month. No migraines. Tylenol  usually helps with abortive therapy. She is working as a Clinical biochemist. She feels stable work schedule (7-2:30) has helped.   04/04/2023 ALL: [name] returns for follow up for IIH. She was last seen 03/2022 and doing well on Nurtec every other day, acetazolimide 1000mg , topiramate  100mg  and gabapentin  200mg  at bedtime. Since, she reports losing her job in 12/2022. She had to stop topitamate and had enough of gabapentin  and acetazolamide  through December and started taking Nurtec just as needed. Headaches are now worse. She reports daily headaches. Most are tension style. No vision loss. Occasional blurred vision with headache. She has not seen ophthalmology this year.   03/29/2022 ALL:  [name] returns for follow up for IIH. She continues Nurtec QOD, acetazolimide 1000mg , topiramate  100mg  and gabapentin  200mg  at bedtime. She reports headaches have improved. She has not had a migraine recently. Maybe 1 every other month. She does have milder headaches that are relieved with Tylenol . She reports recent eye exam was normal. She has some blurred vision with headache but does not have any vision loss. She is  drinking water  regularly. She continues work a swing shift. 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 She has a headache today due to sleep being disrupted. Rest and Tylenol  abort headache.   09/13/2021 ALL: [name] returns for follow up for IIH. She continues acetazolamide  1000mg , topiramate  100mg  and gabapentin  200mg  at bedtime. Nurtec used for abortive therapy. She is having mild headaches 6-7 times a month. She is having migrainous headaches about 4 times a month. Nurtec works well. She reports last eye exam was at least 2 years ago. No vision changes or TOV. She works swing shifts. 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 She knows that diet and hydration are triggers. She usually drinks 120 ounces of water  daily. She has gained about 10lbs over the past year. She does not sleep well. She is having continued concerns of fatigue and brain fog. She has trouble with word finding.   09/13/2020 ALL: [name] returns for annual follow up for IIH. She continues acetazolamide  1000mg  daily (couldn't remember BID dosing), topiramate  200mg  daily and gabapentin  200mg  at bedtime. Nurtec used for abortive therapy. She feels this works better than naratriptan . She was doing very well with rare headaches but reports being out of gabapentin  for about 6 months (didn't request refills) and off topiramate  and acetazolamide  for the past two weeks (pharmacy back order). When taking current regimen as prescribed she feels headaches are well managed. No vision changes. Last eye exam about a year ago.   09/15/2019 ALL:  Vinisha Faxon is a 43 y.o. female here today for follow up of IIH. She continues Diamox  750mg  BID, topiramate  200mg  and  gabapentin  200mg  at bedtime. She uses eletriptan  for abortive therapy. She denies any concerns of headaches. She may have a mild headache from time to time. She is doing well. She reports from time to time, she will have twitching of her eye but it resolves spontaneously. She stays well hydrated. She was last seen by  ophthalmology in 03/2019. She does not have scheduled follow up.   03/17/2019 ALL: [name] is a 43 y.o. female here today for follow up of IIH. She continues Diamox  750mg  twice daily, topiramate  200mg  and gabapentin  200mg  at bedtime. She reports that she has done well until last week. She started having a mild headache about 7 days ago. Headache was centered at the top of her head. Pain was dull aching pain. No light or sound sensitivity but she did have intermittent nausea. Vision was blurry when headache worsened but no vision loss. She reports that headache progressed for 3-4 days. She took one dose of eletriptan  on Friday and two doses on Saturday. Sunday the headache was improved. Today headache is better. Blurry vision is significantly improved. She does report allergy symptoms as well. She has not taken allergy medication. She is not taking Excedrin. She is eating well and drinking plenty of water .    01/16/2019 ALL: [name] is a 43 y.o. female here today for follow up of IIH. She continues Diamox  750mg  twice daily, topiramate  200mg  and gabapentin  200mg  at bedtime daily. She reports that headaches did improve with increased dose of Diamox . About three days ago, she felt a headache starting. She has right sided pressure, worse behind right eye. She was seen yesterday by ER for acute intractable headache treated with metoclopramide  and dexamethasone  10mg . She has noted some improvement today but headache continues. She did forget to tell UC provider yesterday that for a period of time, she felt as if she could only see black in the right outer vision fields. Vision is normal today with exception of mild blurriness with significant pain. She has felt nauseated as well and noticed light sensitivity driving to our appt today. She has not taken any OTC medications or Relpax  as she was unsure if this would help. She is usually cautious about taking OTC medications. She uses Mirena  for severe  dysmenorrhea. We have discussed possible role of Mirena  in worsening  IIH, however, data is conflicting. She is also followed very closely by Dr Ozan, gynecology. Patient feels benefit of controlled menstrual cycles outweighs risks of worsening IIH at this time.    12/03/2018 ALL: Emaley Applin is a 43 y.o. female here today for follow up for headaches with new diagnosis of IIH. She has been seen several times in the ER for worsening headaches since being seen by me in 06/2018. At that time, she reported that headaches were doing well on topiramate  and gabapentin . Relpax  worked well for abortive therapy. She started having worsening of central headaches with pressure behind her eyes about 2-3 weeks ago. She reports that her headache was persistent and unresponsive to therapy. Pressure behind right eye with peripheral vision loss. Last week, LP showed opening pressure of 30. Closing pressure of 12 and headache improved. She was started on Diamox  500mg  twice daily and referred back to us . She has tolerated Diamox  without adverse effects over the past 10 days. She has continued topiramate  200mg  and gabapentin  100mg  at bedtime. MRI was normal. She has noticed more trouble with her memory. She feels that her words are not coming to her. She  has ringing in her ears. She is nauseated at times. She has not had an eye exam. She has Mirena  for birth control placed about a year ago. Twin sister was diagnosed with IIH about 10 years ago.      07/04/2018 ALL: Tanisa Lagace is a 43 y.o. female for follow up of migraines. She is taking topirimate 200mg  and gabapentin  100mg  at bedtime. She uses Relpax  for abortive therapy. She does report sleepiness with Relpax . She has had about 3 migraines in the past year. She has 3-4 headaches per month. She feels that she is doing really well.      HISTORY (copied from [place] millikan's note on 10/29/2017   Ms. Mcgriff is a 43 year old female with a history of migraine headaches.  She  returns today for follow-up.  She states overall her migraine headaches are better.  She has not had a migraine in the last month.  She states that she is not headaches at least 3 times a week.  She typically can take Tylenol  or Excedrin tension.  She reports that she does not use Relpax  as much because she needs to lay down after she takes it.  She states that she works different shifts at her job which also may be affecting her sleep.     HISTORY 04/19/17 Ms. Lippman  is a 43 year old female with a history of migraine.  She is currently taking 200 mg at bedtime.  She uses Relpax  to treat her acute migraines.  She reports that she has had a ongoing headache for the last 3 weeks.  She reports that her pain fluctuates from a 4 out of 10 to a 7 out of 10.  She reports that the headache has been constant.  She has used Tylenol  as well as Relpax .  She reports that these medications has decreased the severity but essentially did not resolve the headache.  She reports the headache usually starts in the center of the head and radiates down to the forehead.  She reports with severe headaches she will have photophobia.  She reports occasionally she will have halos in her vision.  She confirmed that she is also had nausea and vomiting.  She has been using Zofran .  In the past she has had a prednisone  Dosepak for that it works well for her headache.  She returns today for evaluation.   REVIEW OF SYSTEMS: Out of a complete 14 system review of symptoms, the patient complains only of the following symptoms, headaches, fatigue and all other reviewed systems are negative.  ALLERGIES: Allergies  Allergen Reactions   Amoxicillin Hives, Nausea And Vomiting and Nausea Only    Has patient had a PCN reaction causing immediate rash, facial/tongue/throat swelling, SOB or lightheadedness with hypotension: Yes Has patient had a PCN reaction causing severe rash involving mucus membranes or skin necrosis: No Has patient had a PCN  reaction that required hospitalization: No Has patient had a PCN reaction occurring within the last 10 years: No If all of the above answers are NO, then may proceed with Cephalosporin use.    Aspirin Hives, Nausea And Vomiting and Nausea Only   Ibuprofen Hives, Nausea And Vomiting and Nausea Only   Adhesive [Tape] Rash and Other (See Comments)    CERTAIN BANDAGES BREAK OUT THE SKIN !!   Venlafaxine Hcl Palpitations    HOME MEDICATIONS: Outpatient Medications Prior to Visit  Medication Sig Dispense Refill   acetaminophen  (TYLENOL ) 500 MG tablet Take 1 tablet (500 mg total) by  mouth every 6 (six) hours as needed. (Patient taking differently: Take 500 mg by mouth as needed.) 30 tablet 0   albuterol  (VENTOLIN  HFA) 108 (90 Base) MCG/ACT inhaler Inhale 1-2 puffs into the lungs every 6 (six) hours as needed for wheezing or shortness of breath. 18 g 0   cetirizine (ZYRTEC) 10 MG tablet Take 10 mg by mouth daily as needed for allergies or rhinitis.     hyoscyamine  (ANASPAZ ) 0.125 MG TBDP disintergrating tablet Take 0.125 mg by mouth every 4 (four) hours as needed for bladder spasms or cramping.     levonorgestrel  (MIRENA ) 20 MCG/24HR IUD 1 each by Intrauterine route once.     loratadine (CLARITIN) 10 MG tablet Take 10 mg by mouth daily as needed for allergies or rhinitis.     pantoprazole  (PROTONIX ) 40 MG tablet Take 40 mg by mouth 2 (two) times daily.     Potassium Citrate 15 MEQ (1620 MG) TBCR Take 1 tablet by mouth 2 (two) times daily.     valACYclovir (VALTREX) 500 MG tablet Take 500 mg by mouth as needed (as needed for lesions).     acetaZOLAMIDE  (DIAMOX ) 250 MG tablet Take 4 tablets (1,000 mg total) by mouth daily. 360 tablet 3   Atogepant  (QULIPTA ) 60 MG TABS Take 1 tablet (60 mg total) by mouth daily. 90 tablet 3   gabapentin  (NEURONTIN ) 100 MG capsule Take 2 capsules (200 mg total) by mouth at bedtime. 180 capsule 3   topiramate  (TOPAMAX ) 100 MG tablet Take 1 tablet (100 mg total) by  mouth at bedtime. 90 tablet 3   ondansetron  (ZOFRAN -ODT) 8 MG disintegrating tablet Take 8 mg by mouth 3 (three) times daily. (Patient not taking: Reported on 10/15/2023)     No facility-administered medications prior to visit.    PAST MEDICAL HISTORY: Past Medical History:  Diagnosis Date   Asthma    Cancer (HCC)    cancer in my leg   Common migraine with intractable migraine 05/11/2016   Hypertension    IIH (idiopathic intracranial hypertension)    Migraines    PCOS (polycystic ovarian syndrome)    Reactive airway disease    Seizures (HCC)    c/b anesthesia    PAST SURGICAL HISTORY: Past Surgical History:  Procedure Laterality Date   CESAREAN SECTION     one previous   HERNIA REPAIR     NASAL RECONSTRUCTION      FAMILY HISTORY: Family History  Problem Relation Age of Onset   Cancer Mother    Kidney cancer Mother    Colon cancer Father    Breast cancer Maternal Aunt    Cancer Maternal Aunt        ovarian   Diabetes Maternal Grandmother    Diabetes Paternal Grandfather    Migraines Neg Hx     SOCIAL HISTORY: Social History   Socioeconomic History   Marital status: Married    Spouse name: Not on file   Number of children: Not on file   Years of education: Not on file   Highest education level: Not on file  Occupational History   Not on file  Tobacco Use   Smoking status: Never   Smokeless tobacco: Never  Vaping Use   Vaping status: Not on file  Substance and Sexual Activity   Alcohol use: Yes    Comment: mixed drinks 4 weekly   Drug use: No   Sexual activity: Yes    Birth control/protection: I.U.D.  Other Topics Concern   Not  on file  Social History Narrative   Lives at home with husband and son   Right handed   Caffeine: 1 cup/day (coffee or tea)   Pt not working    Social Drivers of Corporate investment banker Strain: Not on file  Food Insecurity: Low Risk  (07/03/2023)   Received from [facility]   Hunger Vital Sign    Within the past  12 months, you worried that your food would run out before you got money to buy more: Never true    Within the past 12 months, the food you bought just didn't last and you didn't have money to get more. : Never true  Transportation Needs: No Transportation Needs (07/03/2023)   Received from [facility]    In the past 12 months, has lack of reliable transportation kept you from medical appointments, meetings, work or from getting things needed for daily living? : No  Physical Activity: Not on file  Stress: Not on file  Social Connections: Not on file  Intimate Partner Violence: Not on file      PHYSICAL EXAM  Vitals:   10/15/23 1327  Weight: 185 lb (83.9 kg)  Height: 5' 6 (1.676 m)        Body mass index is 29.86 kg/m.  Generalized: Well developed, in no acute distress  Cardiology: normal rate and rhythm, no murmur noted Respiratory: clear to auscultation bilaterally  Neurological examination  Mentation: Alert oriented to time, place, history taking. Follows all commands speech and language fluent Cranial nerve II-XII: Pupils were equal round reactive to light. Extraocular movements were full, visual field were full on confrontational test. Facial sensation and strength were normal. Head turning and shoulder shrug  were normal and symmetric. Motor: The motor testing reveals 5 over 5 strength of all 4 extremities. Good symmetric motor tone is noted throughout.  Gait and station: Gait is normal.    DIAGNOSTIC DATA (LABS, IMAGING, TESTING) - I reviewed patient records, labs, notes, testing and imaging myself where available.      No data to display           Lab Results  Component Value Date   WBC 6.0 05/01/2023   HGB 14.1 05/01/2023   HCT 42.3 05/01/2023   MCV 93.8 05/01/2023   PLT 279 05/01/2023      Component Value Date/Time   NA 138 05/01/2023 1402   K 3.8 05/01/2023 1402   CL 108 05/01/2023 1402   CO2 23 05/01/2023 1402   GLUCOSE 89  05/01/2023 1402   BUN 14 05/01/2023 1402   CREATININE 0.91 05/01/2023 1402   CALCIUM 9.0 05/01/2023 1402   PROT 7.0 03/14/2022 0030   ALBUMIN 4.2 03/14/2022 0030   AST 14 (L) 03/14/2022 0030   ALT 14 03/14/2022 0030   ALKPHOS 45 03/14/2022 0030   BILITOT 0.7 03/14/2022 0030   GFRNONAA >60 05/01/2023 1402   GFRAA >60 04/22/2019 1213   No results found for: CHOL, HDL, LDLCALC, LDLDIRECT, TRIG, CHOLHDL No results found for: YHAJ8R No results found for: VITAMINB12 Lab Results  Component Value Date   TSH 0.241 (L) 05/19/2021    ASSESSMENT AND PLAN 43 y.o. year old female  has a past medical history of Asthma, Cancer (HCC), Common migraine with intractable migraine (05/11/2016), Hypertension, IIH (idiopathic intracranial hypertension), Migraines, PCOS (polycystic ovarian syndrome), Reactive airway disease, and Seizures (HCC). here with     ICD-10-CM   1. Migraine without aura and without status  migrainosus, not intractable  G43.009     2. IIH (idiopathic intracranial hypertension)  G93.2       Doni reports headaches are well managed. Recent eye exam did not show concerns of papilledema. We will continue Qulipta , acetazolimide 1000mg , topiramate  100mg  and gabapentin  200mg  daily. She will use Tylenol  as needed for abortive therapy. She was encouraged continue annual follow up with ophthalmology. She was advised against pregnancy. She will continue healthy lifestyle habits. She was encouraged to stay well hydrated. She will follow up in 1 year, sooner if needed. She verbalizes understanding and agreement with this plan.    No orders of the defined types were placed in this encounter.     Meds ordered this encounter  Medications   Atogepant  (QULIPTA ) 60 MG TABS    Sig: Take 1 tablet (60 mg total) by mouth daily.    Dispense:  90 tablet    Refill:  3    Supervising Provider:   AHERN, ANTONIA B [[id number]]   gabapentin  (NEURONTIN ) 100 MG capsule    Sig: Take 2  capsules (200 mg total) by mouth at bedtime.    Dispense:  180 capsule    Refill:  3    Supervising Provider:   AHERN, ANTONIA B [[id number]]   topiramate  (TOPAMAX ) 100 MG tablet    Sig: Take 1 tablet (100 mg total) by mouth at bedtime.    Dispense:  90 tablet    Refill:  3    Supervising Provider:   AHERN, ANTONIA B [[id number]]   acetaZOLAMIDE  (DIAMOX ) 250 MG tablet    Sig: Take 4 tablets (1,000 mg total) by mouth daily.    Dispense:  360 tablet    Refill:  3    Supervising Provider:   AHERN, ANTONIA B [id number]      I personally spent a total of 30 minutes in the care of the patient today including preparing to see the patient, getting/reviewing separately obtained history, performing a medically appropriate exam/evaluation, counseling and educating, placing orders, and documenting clinical information in the EHR.   Greig Forbes, FNP-C 10/15/2023, 2:06 PM [facility] [address], [address] [place], [place] [address] [phone]

## 2023-10-12 NOTE — Patient Instructions (Signed)
 Below is our plan:  We will continue current treatment plan. Let me know if you need me.   Please make sure you are staying well hydrated. I recommend 50-60 ounces daily. Well balanced diet and regular exercise encouraged. Consistent sleep schedule with 6-8 hours recommended.   Please continue follow up with care team as directed.   Follow up with me in 1 year   You may receive a survey regarding today's visit. I encourage you to leave honest feed back as I do use this information to improve patient care. Thank you for seeing me today!   GENERAL HEADACHE INFORMATION:   Natural supplements: Magnesium  Oxide or Magnesium  Glycinate 500 mg at bed (up to 800 mg daily) Coenzyme Q10 300 mg in AM Vitamin B2- 200 mg twice a day   Add 1 supplement at a time since even natural supplements can have undesirable side effects. You can sometimes buy supplements cheaper (especially Coenzyme Q10) at www.WebmailGuide.co.za or at [facility].  Migraine with aura: There is increased risk for stroke in women with migraine with aura and a contraindication for the combined contraceptive pill for use by women who have migraine with aura. The risk for women with migraine without aura is lower. However other risk factors like smoking are far more likely to increase stroke risk than migraine. There is a recommendation for no smoking and for the use of OCPs without estrogen such as progestogen only pills particularly for women with migraine with aura.SABRA People who have migraine headaches with auras may be 3 times more likely to have a stroke caused by a blood clot, compared to migraine patients who don't see auras. Women who take hormone-replacement therapy may be 30 percent more likely to suffer a clot-based stroke than women not taking medication containing estrogen. Other risk factors like smoking and high blood pressure may be  much more important.    Vitamins and herbs that show potential:   Magnesium : Magnesium  (250 mg twice a  day or 500 mg at bed) has a relaxant effect on smooth muscles such as blood vessels. Individuals suffering from frequent or daily headache usually have low magnesium  levels which can be increase with daily supplementation of 400-750 mg. Three trials found 40-90% average headache reduction  when used as a preventative. Magnesium  may help with headaches are aura, the best evidence for magnesium  is for migraine with aura is its thought to stop the cortical spreading depression we believe is the pathophysiology of migraine aura.Magnesium  also demonstrated the benefit in menstrually related migraine.  Magnesium  is part of the messenger system in the serotonin cascade and it is a good muscle relaxant.  It is also useful for constipation which can be a side effect of other medications used to treat migraine. Good sources include nuts, whole grains, and tomatoes. Side Effects: loose stool/diarrhea  Riboflavin (vitamin B 2) 200 mg twice a day. This vitamin assists nerve cells in the production of ATP a principal energy storing molecule.  It is necessary for many chemical reactions in the body.  There have been at least 3 clinical trials of riboflavin using 400 mg per day all of which suggested that migraine frequency can be decreased.  All 3 trials showed significant improvement in over half of migraine sufferers.  The supplement is found in bread, cereal, milk, meat, and poultry.  Most Americans get more riboflavin than the recommended daily allowance, however riboflavin deficiency is not necessary for the supplements to help prevent headache. Side effects: energizing, green  urine   Coenzyme Q10: This is present in almost all cells in the body and is critical component for the conversion of energy.  Recent studies have shown that a nutritional supplement of CoQ10 can reduce the frequency of migraine attacks by improving the energy production of cells as with riboflavin.  Doses of 150 mg twice a day have been shown to be  effective.   Melatonin: Increasing evidence shows correlation between melatonin secretion and headache conditions.  Melatonin supplementation has decreased headache intensity and duration.  It is widely used as a sleep aid.  Sleep is natures way of dealing with migraine.  A dose of 3 mg is recommended to start for headaches including cluster headache. Higher doses up to 15 mg has been reviewed for use in Cluster headache and have been used. The rationale behind using melatonin for cluster is that many theories regarding the cause of Cluster headache center around the disruption of the normal circadian rhythm in the brain.  This helps restore the normal circadian rhythm.   HEADACHE DIET: Foods and beverages which may trigger migraine Note that only 20% of headache patients are food sensitive. You will know if you are food sensitive if you get a headache consistently 20 minutes to 2 hours after eating a certain food. Only cut out a food if it causes headaches, otherwise you might remove foods you enjoy! What matters most for diet is to eat a well balanced healthy diet full of vegetables and low fat protein, and to not miss meals.   Chocolate, other sweets ALL cheeses except cottage and cream cheese Dairy products, yogurt, sour cream, ice cream Liver Meat extracts (Bovril, Marmite, meat tenderizers) Meats or fish which have undergone aging, fermenting, pickling or smoking. These include: Hotdogs,salami,Lox,sausage, mortadellas,smoked salmon, pepperoni, Pickled herring Pods of broad bean (English beans, Chinese pea pods, [place] (fava) beans, lima and navy beans Ripe avocado, ripe banana Yeast extracts or active yeast preparations such as Brewer's or Fleishman's (commercial bakes goods are permitted) Tomato based foods, pizza (lasagna, etc.)   MSG (monosodium glutamate) is disguised as many things; look for these common aliases: Monopotassium glutamate Autolysed yeast Hydrolysed protein Sodium  caseinate "flavorings" "all natural preservatives Nutrasweet   Avoid all other foods that convincingly provoke headaches.   Resources: The Dizzy Bluford Aid Your Headache Diet, migrainestrong.com  https://zamora-andrews.com/   Caffeine and Migraine For patients that have migraine, caffeine intake more than 3 days per week can lead to dependency and increased migraine frequency. I would recommend cutting back on your caffeine intake as best you can. The recommended amount of caffeine is 200-300 mg daily, although migraine patients may experience dependency at even lower doses. While you may notice an increase in headache temporarily, cutting back will be helpful for headaches in the long run. For more information on caffeine and migraine, visit: https://americanmigrainefoundation.org/resource-library/caffeine-and-migraine/   Headache Prevention Strategies:   1. Maintain a headache diary; learn to identify and avoid triggers.  - This can be a simple note where you log when you had a headache, associated symptoms, and medications used - There are several smartphone apps developed to help track migraines: Migraine Buddy, Migraine Monitor, Curelator N1-Headache App   Common triggers include: Emotional triggers: Emotional/Upset family or friends Emotional/Upset occupation Business reversal/success Anticipation anxiety Crisis-serious Post-crisis periodNew job/position   Physical triggers: Vacation Day Weekend Strenuous Exercise High Altitude Location New Move Menstrual Day Physical Illness Oversleep/Not enough sleep Weather changes Light: Photophobia or light sesnitivity treatment involves a balance between  desensitization and reduction in overly strong input. Use dark polarized glasses outside, but not inside. Avoid bright or fluorescent light, but do not dim environment to the point that going into a normally lit room hurts. Consider  FL-41 tint lenses, which reduce the most irritating wavelengths without blocking too much light.  These can be obtained at axonoptics.com or theraspecs.com Foods: see list above.   2. Limit use of acute treatments (over-the-counter medications, triptans, etc.) to no more than 2 days per week or 10 days per month to prevent medication overuse headache (rebound headache).     3. Follow a regular schedule (including weekends and holidays): Don't skip meals. Eat a balanced diet. 8 hours of sleep nightly. Minimize stress. Exercise 30 minutes per day. Being overweight is associated with a 5 times increased risk of chronic migraine. Keep well hydrated and drink 6-8 glasses of water  per day.   4. Initiate non-pharmacologic measures at the earliest onset of your headache. Rest and quiet environment. Relax and reduce stress. Breathe2Relax is a free app that can instruct you on    some simple relaxtion and breathing techniques. Http://Dawnbuse.com is a    free website that provides teaching videos on relaxation.  Also, there are  many apps that   can be downloaded for "mindful" relaxation.  An app called YOGA NIDRA will help walk you through mindfulness. Another app called Calm can be downloaded to give you a structured mindfulness guide with daily reminders and skill development. Headspace for guided meditation Mindfulness Based Stress Reduction Online Course: www.palousemindfulness.com Cold compresses.   5. Don't wait!! Take the maximum allowable dosage of prescribed medication at the first sign of migraine.   6. Compliance:  Take prescribed medication regularly as directed and at the first sign of a migraine.   7. Communicate:  Call your physician when problems arise, especially if your headaches change, increase in frequency/severity, or become associated with neurological symptoms (weakness, numbness, slurred speech, etc.). Proceed to emergency room if you experience new or worsening symptoms or  symptoms do not resolve, if you have new neurologic symptoms or if headache is severe, or for any concerning symptom.   8. Headache/pain management therapies: Consider various complementary methods, including medication, behavioral therapy, psychological counselling, biofeedback, massage therapy, acupuncture, dry needling, and other modalities.  Such measures may reduce the need for medications. Counseling for pain management, where patients learn to function and ignore/minimize their pain, seems to work very well.   9. Recommend changing family's attention and focus away from patient's headaches. Instead, emphasize daily activities. If first question of day is 'How are your headaches/Do you have a headache today?', then patient will constantly think about headaches, thus making them worse. Goal is to re-direct attention away from headaches, toward daily activities and other distractions.   10. Helpful Websites: www.AmericanHeadacheSociety.org PatentHood.ch www.headaches.org TightMarket.nl www.achenet.org

## 2023-10-15 ENCOUNTER — Encounter: Payer: Self-pay | Admitting: Family Medicine

## 2023-10-15 ENCOUNTER — Ambulatory Visit (INDEPENDENT_AMBULATORY_CARE_PROVIDER_SITE_OTHER): Payer: 59 | Admitting: Family Medicine

## 2023-10-15 VITALS — Ht 66.0 in | Wt 185.0 lb

## 2023-10-15 DIAGNOSIS — J45909 Unspecified asthma, uncomplicated: Secondary | ICD-10-CM | POA: Insufficient documentation

## 2023-10-15 DIAGNOSIS — G932 Benign intracranial hypertension: Secondary | ICD-10-CM

## 2023-10-15 DIAGNOSIS — G43009 Migraine without aura, not intractable, without status migrainosus: Secondary | ICD-10-CM

## 2023-10-15 DIAGNOSIS — K5900 Constipation, unspecified: Secondary | ICD-10-CM | POA: Insufficient documentation

## 2023-10-15 DIAGNOSIS — G43909 Migraine, unspecified, not intractable, without status migrainosus: Secondary | ICD-10-CM | POA: Insufficient documentation

## 2023-10-15 MED ORDER — TOPIRAMATE 100 MG PO TABS: 100.0000 mg | ORAL_TABLET | Freq: Every day | ORAL | 3 refills | Status: AC

## 2023-10-15 MED ORDER — QULIPTA 60 MG PO TABS: 60.0000 mg | ORAL_TABLET | Freq: Every day | ORAL | 3 refills | Status: AC

## 2023-10-15 MED ORDER — ACETAZOLAMIDE 250 MG PO TABS: 1000.0000 mg | ORAL_TABLET | Freq: Every day | ORAL | 3 refills | Status: AC

## 2023-10-15 MED ORDER — GABAPENTIN 100 MG PO CAPS: 200.0000 mg | ORAL_CAPSULE | Freq: Every day | ORAL | 3 refills | Status: AC

## 2023-10-16 ENCOUNTER — Telehealth: Payer: Self-pay | Admitting: Pharmacist

## 2023-10-16 NOTE — Telephone Encounter (Signed)
 Pharmacy Patient Advocate Encounter  Received notification from [facility] that Prior Authorization for QULIPTA  60 MG PO TABS has been APPROVED from 10/16/2023 to 10/15/2024   PA #/Case ID/Reference #: [id number]

## 2023-10-16 NOTE — Telephone Encounter (Signed)
 Pharmacy Patient Advocate Encounter   Received notification from Patient Pharmacy that prior authorization for Qulipta  60MG  tablets is required/requested.   Insurance verification completed.   The patient is insured through [facility] .   Per test claim: PA required; PA submitted to above mentioned insurance via Latent Key/confirmation #/EOC [id number] Status is pending

## 2023-10-17 ENCOUNTER — Encounter (HOSPITAL_COMMUNITY): Payer: Self-pay

## 2023-10-17 ENCOUNTER — Ambulatory Visit (HOSPITAL_COMMUNITY)
Admission: EM | Admit: 2023-10-17 | Discharge: 2023-10-17 | Disposition: A | Discharge status: Home / Self Care | Attending: Family Medicine | Admitting: Family Medicine

## 2023-10-17 DIAGNOSIS — J069 Acute upper respiratory infection, unspecified: Secondary | ICD-10-CM

## 2023-10-17 DIAGNOSIS — R051 Acute cough: Secondary | ICD-10-CM | POA: Diagnosis not present

## 2023-10-17 DIAGNOSIS — J3489 Other specified disorders of nose and nasal sinuses: Secondary | ICD-10-CM

## 2023-10-17 LAB — POC SARS CORONAVIRUS 2 AG -  ED: SARS Coronavirus 2 Ag: NEGATIVE

## 2023-10-17 MED ORDER — GUAIFENESIN ER 600 MG PO TB12: 600.0000 mg | ORAL_TABLET | Freq: Two times a day (BID) | ORAL | 0 refills | Status: AC

## 2023-10-17 MED ORDER — METHYLPREDNISOLONE 4 MG PO TBPK: ORAL_TABLET | ORAL | 0 refills | Status: DC

## 2023-10-17 NOTE — ED Provider Notes (Signed)
 [facility]    CSN: [id number] Arrival date & time: 10/17/23  0803      History   Chief Complaint Chief Complaint  Patient presents with   Sore Throat   SINUS PRESSURE    HPI [name] is a 43 y.o. female.    Sore Throat  Patient is here for cough x 3 days.  Yesterday started with runny nose and sore throat.  Itchy throat.  She has been spitting up yellow mucous all morning.  No fevers/chills.  No wheezing or sob.  She has taken allergy medication, but not sure what.        Past Medical History:  Diagnosis Date   Asthma    Cancer (HCC)    cancer in my leg   Common migraine with intractable migraine 05/11/2016   Hypertension    IIH (idiopathic intracranial hypertension)    Migraines    PCOS (polycystic ovarian syndrome)    Reactive airway disease    Seizures (HCC)    c/b anesthesia    Patient Active Problem List   Diagnosis Date Noted   Asthma 10/15/2023   Constipation 10/15/2023   Migraine 10/15/2023   Common migraine with intractable migraine 05/11/2016   Axillary lump 06/25/2012    Past Surgical History:  Procedure Laterality Date   CESAREAN SECTION     one previous   HERNIA REPAIR     NASAL RECONSTRUCTION      OB History     Gravida  1   Para  1   Term  1   Preterm      AB      Living  1      SAB      IAB      Ectopic      Multiple      Live Births               Home Medications    Prior to Admission medications   Medication Sig Start Date End Date Taking? Authorizing Provider  acetaminophen  (TYLENOL ) 500 MG tablet Take 1 tablet (500 mg total) by mouth every 6 (six) hours as needed. Patient taking differently: Take 500 mg by mouth as needed. 11/14/21  Yes Redwine, Madison A, PA-C  acetaZOLAMIDE  (DIAMOX ) 250 MG tablet Take 4 tablets (1,000 mg total) by mouth daily. 10/15/23  Yes Lomax, Amy, NP  albuterol  (VENTOLIN  HFA) 108 (90 Base) MCG/ACT inhaler Inhale 1-2 puffs into the lungs every 6 (six) hours  as needed for wheezing or shortness of breath. 01/02/21  Yes Aberman, Aleck BROCKS, PA-C  Atogepant  (QULIPTA ) 60 MG TABS Take 1 tablet (60 mg total) by mouth daily. 10/15/23  Yes Lomax, Amy, NP  cetirizine (ZYRTEC) 10 MG tablet Take 10 mg by mouth daily as needed for allergies or rhinitis.   Yes [provider]  gabapentin  (NEURONTIN ) 100 MG capsule Take 2 capsules (200 mg total) by mouth at bedtime. 10/15/23  Yes Lomax, Amy, NP  hyoscyamine  (ANASPAZ ) 0.125 MG TBDP disintergrating tablet Take 0.125 mg by mouth every 4 (four) hours as needed for bladder spasms or cramping.   Yes [provider]  levonorgestrel  (MIRENA ) 20 MCG/24HR IUD 1 each by Intrauterine route once.   Yes [provider]  loratadine (CLARITIN) 10 MG tablet Take 10 mg by mouth daily as needed for allergies or rhinitis.   Yes [provider]  ondansetron  (ZOFRAN -ODT) 8 MG disintegrating tablet Take 8 mg by mouth 3 (three) times daily. 03/13/22  Yes [provider]  pantoprazole  (PROTONIX ) 40 MG tablet Take 40 mg by mouth 2 (two) times daily. 03/07/22  Yes [provider]  Potassium Citrate 15 MEQ (1620 MG) TBCR Take 1 tablet by mouth 2 (two) times daily. 03/07/22  Yes [provider]  topiramate  (TOPAMAX ) 100 MG tablet Take 1 tablet (100 mg total) by mouth at bedtime. 10/15/23  Yes Lomax, Amy, NP  valACYclovir (VALTREX) 500 MG tablet Take 500 mg by mouth as needed (as needed for lesions).   Yes [provider]    Family History Family History  Problem Relation Age of Onset   Cancer Mother    Kidney cancer Mother    Colon cancer Father    Breast cancer Maternal Aunt    Cancer Maternal Aunt        ovarian   Diabetes Maternal Grandmother    Diabetes Paternal Grandfather    Migraines Neg Hx     Social History Social History   Tobacco Use   Smoking status: Never   Smokeless tobacco: Never  Substance Use Topics   Alcohol use: Yes    Comment: mixed drinks 4  weekly   Drug use: No     Allergies   Amoxicillin, Aspirin, Ibuprofen, Adhesive [tape], and Venlafaxine hcl   Review of Systems Review of Systems  Constitutional: Negative.   HENT:  Positive for congestion, rhinorrhea, sinus pressure, sinus pain and sore throat.   Respiratory:  Positive for cough.   Cardiovascular: Negative.   Gastrointestinal: Negative.   Musculoskeletal: Negative.   Psychiatric/Behavioral: Negative.       Physical Exam Triage Vital Signs ED Triage Vitals  Encounter Vitals Group     BP 10/17/23 0839 (!) 163/104     Girls Systolic BP Percentile --      Girls Diastolic BP Percentile --      Boys Systolic BP Percentile --      Boys Diastolic BP Percentile --      Pulse Rate 10/17/23 0839 70     Resp 10/17/23 0839 20     Temp 10/17/23 0839 98.4 F (36.9 C)     Temp Source 10/17/23 0839 Oral     SpO2 10/17/23 0839 98 %     Weight --      Height --      Head Circumference --      Peak Flow --      Pain Score 10/17/23 0840 0     Pain Loc --      Pain Education --      Exclude from Growth Chart --    No data found.  Updated Vital Signs BP (!) 163/104 (BP Location: Left Arm)   Pulse 70   Temp 98.4 F (36.9 C) (Oral)   Resp 20   SpO2 98%   Visual Acuity Right Eye Distance:   Left Eye Distance:   Bilateral Distance:    Right Eye Near:   Left Eye Near:    Bilateral Near:     Physical Exam Constitutional:      General: She is not in acute distress.    Appearance: She is well-developed and normal weight. She is not ill-appearing or toxic-appearing.  HENT:     Right Ear: Tympanic membrane normal.     Left Ear: Tympanic membrane normal.     Nose: Congestion and rhinorrhea present.     Right Sinus: Maxillary sinus tenderness and frontal sinus tenderness present.     Left Sinus: Maxillary  sinus tenderness and frontal sinus tenderness present.     Mouth/Throat:     Mouth: Mucous membranes are moist.     Pharynx: No oropharyngeal exudate or  posterior oropharyngeal erythema.  Cardiovascular:     Rate and Rhythm: Normal rate and regular rhythm.     Heart sounds: Normal heart sounds.  Pulmonary:     Effort: Pulmonary effort is normal.     Breath sounds: Normal breath sounds.  Musculoskeletal:     Cervical back: Normal range of motion and neck supple.  Lymphadenopathy:     Cervical: No cervical adenopathy.  Skin:    General: Skin is warm.  Neurological:     General: No focal deficit present.     Mental Status: She is alert.  Psychiatric:        Mood and Affect: Mood normal.      UC Treatments / Results  Labs (all labs ordered are listed, but only abnormal results are displayed) Labs Reviewed  POC SARS CORONAVIRUS 2 AG -  ED    EKG   Radiology No results found.  Procedures Procedures (including critical care time)  Medications Ordered in UC Medications - No data to display  Initial Impression / Assessment and Plan / UC Course  I have reviewed the triage vital signs and the nursing notes.  Pertinent labs & imaging results that were available during my care of the patient were reviewed by me and considered in my medical decision making (see chart for details).   Final Clinical Impressions(s) / UC Diagnoses   Final diagnoses:  Acute cough  Viral URI with cough  Sinus pressure     Discharge Instructions      You were seen today for upper respiratory symptoms.  Your covid swab was negative.  At this time, I believe your symptoms are viral.  I do not see evidence of bacterial infection.  I have sent out a steroid pack to help with sinus pain/pressure and congestion, as well as mucinex  to help dry up secretions.  If you continue with sinus congestion, pain or pressure for 7 days, then you  may return for re-evaluation.     ED Prescriptions     Medication Sig Dispense Auth. Provider   methylPREDNISolone  (MEDROL  DOSEPAK) 4 MG TBPK tablet Take as directed 1 each Camrie Stock, MD   guaiFENesin   (MUCINEX ) 600 MG 12 hr tablet Take 1 tablet (600 mg total) by mouth 2 (two) times daily. 20 tablet Darral Longs, MD      PDMP not reviewed this encounter.   Darral Longs, MD 10/17/23 [phone]

## 2023-10-17 NOTE — ED Triage Notes (Signed)
 Patient states she has been coughing up yellow mucous x 3 days. Patient states she has a history of sinus infection. Patient states she feels pressure around her nose. Patient is taking OTC medication.

## 2023-10-17 NOTE — Discharge Instructions (Signed)
 You were seen today for upper respiratory symptoms.  Your covid swab was negative.  At this time, I believe your symptoms are viral.  I do not see evidence of bacterial infection.  I have sent out a steroid pack to help with sinus pain/pressure and congestion, as well as mucinex  to help dry up secretions.  If you continue with sinus congestion, pain or pressure for 7 days, then you  may return for re-evaluation.

## 2023-11-06 ENCOUNTER — Telehealth: Payer: Self-pay | Admitting: Family Medicine

## 2023-11-06 NOTE — Telephone Encounter (Signed)
Duplicate, see other phone note from today.

## 2023-11-06 NOTE — Telephone Encounter (Signed)
 Pt called stating that she is still having really bad headaches , Pt did go to ER and they gave Pt medication  . ER  informed Pt to follow up with MD to get an earlier appt due to Pt blood pressure bring extremely high

## 2023-11-06 NOTE — Telephone Encounter (Signed)
 Pt called requesting to be seen sooner the patient was just discharged form Hospital and they are requesting for Pt to have a follow up the patient states she has been getting really bad Head aches and she went to ER on 9-28 . Pt is requesting earlier appt or Hospital FU

## 2023-11-06 NOTE — Telephone Encounter (Signed)
 Called pt [phone]. Relayed message from Amy. Pt has appt scheduled with PCP 11/28/23. She still wanted to make appt to see Amy to discuss migraines. Unsure if migraines causing elevated BP d/t pain or if elevated BP causing migraines.   Scheduled appt for 11/07/23 at 930am, check in 9am.

## 2023-11-06 NOTE — Telephone Encounter (Signed)
 Pt seen at [facility] Emergency Department 11/04/23. Notes state for her to f/u with Amy for close monitoring (notes in EPIC)

## 2023-11-07 ENCOUNTER — Encounter: Payer: Self-pay | Admitting: Family Medicine

## 2023-11-07 ENCOUNTER — Ambulatory Visit (INDEPENDENT_AMBULATORY_CARE_PROVIDER_SITE_OTHER): Admitting: Family Medicine

## 2023-11-07 VITALS — BP 124/90 | HR 86 | Ht 66.0 in | Wt 184.2 lb

## 2023-11-07 DIAGNOSIS — G43009 Migraine without aura, not intractable, without status migrainosus: Secondary | ICD-10-CM | POA: Diagnosis not present

## 2023-11-07 MED ORDER — KETOROLAC TROMETHAMINE 30 MG/ML IJ SOLN: 30.0000 mg | Freq: Once | INTRAMUSCULAR | Status: DC

## 2023-11-07 MED ORDER — KETOROLAC TROMETHAMINE 60 MG/2ML IM SOLN
60.0000 mg | Freq: Once | INTRAMUSCULAR | Status: AC
  Administered 2023-11-07: 30 mg via INTRAMUSCULAR

## 2023-11-07 NOTE — Progress Notes (Signed)
 PATIENT: [name] DOB: 1980/03/08  REASON FOR VISIT: follow up HISTORY FROM: patient  Chief Complaint  Patient presents with   Follow-up    Pt in room 1. Alone. Here for migraine follow up.    HISTORY OF PRESENT ILLNESS:  11/07/23 ALL:  [name] returns for acute visit for worsening headaches for the past two weeks. She was diagnosed with a URI two weeks ago. She was having sinus pressure and cough. She was treated with Mucinex  and prednisone . She reports headaches have continued since. BP has been up and down. Most readings 130/80-90. She has been taking Tylenol  for abortive therapy with little improvement. She does have a reported allergy to ibuprofen (rash, hives, nausea). No hx of respiratory distress. She has taken Toradol  a couple times in the past with no known reaction.   She continues to have left ear pressure/popping. Temp has been around 99 at home. No body aches or chills.   10/15/2023 ALL: [name] returns for follow up for IIH and migraines. She was last seen 03/2023 and reported worsening headaches after stopping her medications. We restarted Nurtec every other day, acetazolimide 1000mg , topiramate  100mg  and gabapentin  200mg  daily. Nurtec was not covered and switched to Qulipta .   Since, she reports doing well. She reports getting glasses about 6 months ago that has helped. She has tolerated meds. She may have 3-4 milder tension headaches per month. No migraines. Tylenol  usually helps with abortive therapy. She is working as a Clinical biochemist. She feels stable work schedule (7-2:30) has helped.   04/04/2023 ALL: [name] returns for follow up for IIH. She was last seen 03/2022 and doing well on Nurtec every other day, acetazolimide 1000mg , topiramate  100mg  and gabapentin  200mg  at bedtime. Since, she reports losing her job in 12/2022. She had to stop topitamate and had enough of gabapentin  and acetazolamide  through December and started taking Nurtec just as needed.  Headaches are now worse. She reports daily headaches. Most are tension style. No vision loss. Occasional blurred vision with headache. She has not seen ophthalmology this year.   03/29/2022 ALL:  [name] returns for follow up for IIH. She continues Nurtec QOD, acetazolimide 1000mg , topiramate  100mg  and gabapentin  200mg  at bedtime. She reports headaches have improved. She has not had a migraine recently. Maybe 1 every other month. She does have milder headaches that are relieved with Tylenol . She reports recent eye exam was normal. She has some blurred vision with headache but does not have any vision loss. She is drinking water  regularly. She continues work a swing shift. 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 She has a headache today due to sleep being disrupted. Rest and Tylenol  abort headache.   09/13/2021 ALL: [name] returns for follow up for IIH. She continues acetazolamide  1000mg , topiramate  100mg  and gabapentin  200mg  at bedtime. Nurtec used for abortive therapy. She is having mild headaches 6-7 times a month. She is having migrainous headaches about 4 times a month. Nurtec works well. She reports last eye exam was at least 2 years ago. No vision changes or TOV. She works swing shifts. 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 She knows that diet and hydration are triggers. She usually drinks 120 ounces of water  daily. She has gained about 10lbs over the past year. She does not sleep well. She is having continued concerns of fatigue and brain fog. She has trouble with word finding.   09/13/2020 ALL: [name] returns for annual follow up for IIH. She continues acetazolamide  1000mg  daily (couldn't  remember BID dosing), topiramate  200mg  daily and gabapentin  200mg  at bedtime. Nurtec used for abortive therapy. She feels this works better than naratriptan . She was doing very well with rare headaches but reports being out of gabapentin  for about 6 months (didn't request refills) and off topiramate  and acetazolamide  for the past two weeks  (pharmacy back order). When taking current regimen as prescribed she feels headaches are well managed. No vision changes. Last eye exam about a year ago.   09/15/2019 ALL:  [name] is a 43 y.o. female here today for follow up of IIH. She continues Diamox  750mg  BID, topiramate  200mg  and gabapentin  200mg  at bedtime. She uses eletriptan  for abortive therapy. She denies any concerns of headaches. She may have a mild headache from time to time. She is doing well. She reports from time to time, she will have twitching of her eye but it resolves spontaneously. She stays well hydrated. She was last seen by ophthalmology in 03/2019. She does not have scheduled follow up.   03/17/2019 ALL: [name] is a 43 y.o. female here today for follow up of IIH. She continues Diamox  750mg  twice daily, topiramate  200mg  and gabapentin  200mg  at bedtime. She reports that she has done well until last week. She started having a mild headache about 7 days ago. Headache was centered at the top of her head. Pain was dull aching pain. No light or sound sensitivity but she did have intermittent nausea. Vision was blurry when headache worsened but no vision loss. She reports that headache progressed for 3-4 days. She took one dose of eletriptan  on Friday and two doses on Saturday. Sunday the headache was improved. Today headache is better. Blurry vision is significantly improved. She does report allergy symptoms as well. She has not taken allergy medication. She is not taking Excedrin. She is eating well and drinking plenty of water .    01/16/2019 ALL: [name] is a 43 y.o. female here today for follow up of IIH. She continues Diamox  750mg  twice daily, topiramate  200mg  and gabapentin  200mg  at bedtime daily. She reports that headaches did improve with increased dose of Diamox . About three days ago, she felt a headache starting. She has right sided pressure, worse behind right eye. She was seen yesterday by ER for acute  intractable headache treated with metoclopramide  and dexamethasone  10mg . She has noted some improvement today but headache continues. She did forget to tell UC provider yesterday that for a period of time, she felt as if she could only see black in the right outer vision fields. Vision is normal today with exception of mild blurriness with significant pain. She has felt nauseated as well and noticed light sensitivity driving to our appt today. She has not taken any OTC medications or Relpax  as she was unsure if this would help. She is usually cautious about taking OTC medications. She uses Mirena  for severe dysmenorrhea. We have discussed possible role of Mirena  in worsening  IIH, however, data is conflicting. She is also followed very closely by Dr Ozan, gynecology. Patient feels benefit of controlled menstrual cycles outweighs risks of worsening IIH at this time.    12/03/2018 ALL: Alverna Fawley is a 43 y.o. female here today for follow up for headaches with new diagnosis of IIH. She has been seen several times in the ER for worsening headaches since being seen by me in 06/2018. At that time, she reported that headaches were doing well on topiramate  and gabapentin . Relpax  worked well for abortive therapy. She  started having worsening of central headaches with pressure behind her eyes about 2-3 weeks ago. She reports that her headache was persistent and unresponsive to therapy. Pressure behind right eye with peripheral vision loss. Last week, LP showed opening pressure of 30. Closing pressure of 12 and headache improved. She was started on Diamox  500mg  twice daily and referred back to us . She has tolerated Diamox  without adverse effects over the past 10 days. She has continued topiramate  200mg  and gabapentin  100mg  at bedtime. MRI was normal. She has noticed more trouble with her memory. She feels that her words are not coming to her. She has ringing in her ears. She is nauseated at times. She has not had an eye  exam. She has Mirena  for birth control placed about a year ago. Twin sister was diagnosed with IIH about 10 years ago.      07/04/2018 ALL: Sebrena Engh is a 43 y.o. female for follow up of migraines. She is taking topirimate 200mg  and gabapentin  100mg  at bedtime. She uses Relpax  for abortive therapy. She does report sleepiness with Relpax . She has had about 3 migraines in the past year. She has 3-4 headaches per month. She feels that she is doing really well.      HISTORY (copied from [facility] millikan's note on 10/29/2017   Ms. Noh is a 43 year old female with a history of migraine headaches.  She returns today for follow-up.  She states overall her migraine headaches are better.  She has not had a migraine in the last month.  She states that she is not headaches at least 3 times a week.  She typically can take Tylenol  or Excedrin tension.  She reports that she does not use Relpax  as much because she needs to lay down after she takes it.  She states that she works different shifts at her job which also may be affecting her sleep.     HISTORY 04/19/17 Ms. Winkel  is a 43 year old female with a history of migraine.  She is currently taking 200 mg at bedtime.  She uses Relpax  to treat her acute migraines.  She reports that she has had a ongoing headache for the last 3 weeks.  She reports that her pain fluctuates from a 4 out of 10 to a 7 out of 10.  She reports that the headache has been constant.  She has used Tylenol  as well as Relpax .  She reports that these medications has decreased the severity but essentially did not resolve the headache.  She reports the headache usually starts in the center of the head and radiates down to the forehead.  She reports with severe headaches she will have photophobia.  She reports occasionally she will have halos in her vision.  She confirmed that she is also had nausea and vomiting.  She has been using Zofran .  In the past she has had a prednisone  Dosepak for that  it works well for her headache.  She returns today for evaluation.   REVIEW OF SYSTEMS: Out of a complete 14 system review of symptoms, the patient complains only of the following symptoms, headaches, fatigue and all other reviewed systems are negative.  ALLERGIES: Allergies  Allergen Reactions   Amoxicillin Hives, Nausea And Vomiting and Nausea Only    Has patient had a PCN reaction causing immediate rash, facial/tongue/throat swelling, SOB or lightheadedness with hypotension: Yes Has patient had a PCN reaction causing severe rash involving mucus membranes or skin necrosis: No Has patient had a PCN  reaction that required hospitalization: No Has patient had a PCN reaction occurring within the last 10 years: No If all of the above answers are NO, then may proceed with Cephalosporin use.    Aspirin Hives, Nausea And Vomiting and Nausea Only   Ibuprofen Hives, Nausea And Vomiting and Nausea Only   Adhesive [Tape] Rash and Other (See Comments)    CERTAIN BANDAGES BREAK OUT THE SKIN !!   Venlafaxine Hcl Palpitations    HOME MEDICATIONS: Outpatient Medications Prior to Visit  Medication Sig Dispense Refill   acetaminophen  (TYLENOL ) 500 MG tablet Take 1 tablet (500 mg total) by mouth every 6 (six) hours as needed. (Patient taking differently: Take 500 mg by mouth as needed.) 30 tablet 0   acetaZOLAMIDE  (DIAMOX ) 250 MG tablet Take 4 tablets (1,000 mg total) by mouth daily. 360 tablet 3   albuterol  (VENTOLIN  HFA) 108 (90 Base) MCG/ACT inhaler Inhale 1-2 puffs into the lungs every 6 (six) hours as needed for wheezing or shortness of breath. 18 g 0   Atogepant  (QULIPTA ) 60 MG TABS Take 1 tablet (60 mg total) by mouth daily. 90 tablet 3   cetirizine (ZYRTEC) 10 MG tablet Take 10 mg by mouth daily as needed for allergies or rhinitis.     gabapentin  (NEURONTIN ) 100 MG capsule Take 2 capsules (200 mg total) by mouth at bedtime. 180 capsule 3   hyoscyamine  (ANASPAZ ) 0.125 MG TBDP disintergrating  tablet Take 0.125 mg by mouth every 4 (four) hours as needed for bladder spasms or cramping.     levonorgestrel  (MIRENA ) 20 MCG/24HR IUD 1 each by Intrauterine route once.     loratadine (CLARITIN) 10 MG tablet Take 10 mg by mouth daily as needed for allergies or rhinitis.     ondansetron  (ZOFRAN -ODT) 8 MG disintegrating tablet Take 8 mg by mouth 3 (three) times daily.     pantoprazole  (PROTONIX ) 40 MG tablet Take 40 mg by mouth 2 (two) times daily.     Potassium Citrate 15 MEQ (1620 MG) TBCR Take 1 tablet by mouth 2 (two) times daily.     topiramate  (TOPAMAX ) 100 MG tablet Take 1 tablet (100 mg total) by mouth at bedtime. 90 tablet 3   valACYclovir (VALTREX) 500 MG tablet Take 500 mg by mouth as needed (as needed for lesions).     guaiFENesin  (MUCINEX ) 600 MG 12 hr tablet Take 1 tablet (600 mg total) by mouth 2 (two) times daily. (Patient not taking: Reported on 11/07/2023) 20 tablet 0   methylPREDNISolone  (MEDROL  DOSEPAK) 4 MG TBPK tablet Take as directed (Patient not taking: Reported on 11/07/2023) 1 each 0   No facility-administered medications prior to visit.    PAST MEDICAL HISTORY: Past Medical History:  Diagnosis Date   Asthma    Cancer (HCC)    cancer in my leg   Common migraine with intractable migraine 05/11/2016   Hypertension    IIH (idiopathic intracranial hypertension)    Migraines    PCOS (polycystic ovarian syndrome)    Reactive airway disease    Seizures (HCC)    c/b anesthesia    PAST SURGICAL HISTORY: Past Surgical History:  Procedure Laterality Date   CESAREAN SECTION     one previous   HERNIA REPAIR     NASAL RECONSTRUCTION      FAMILY HISTORY: Family History  Problem Relation Age of Onset   Cancer Mother    Kidney cancer Mother    Colon cancer Father    Breast cancer Maternal Aunt  Cancer Maternal Aunt        ovarian   Diabetes Maternal Grandmother    Diabetes Paternal Grandfather    Migraines Neg Hx     SOCIAL HISTORY: Social History    Socioeconomic History   Marital status: Married    Spouse name: Not on file   Number of children: Not on file   Years of education: Not on file   Highest education level: Not on file  Occupational History   Not on file  Tobacco Use   Smoking status: Never   Smokeless tobacco: Never  Vaping Use   Vaping status: Not on file  Substance and Sexual Activity   Alcohol use: Not Currently    Comment: mixed drinks 4 weekly   Drug use: No   Sexual activity: Yes    Birth control/protection: I.U.D.  Other Topics Concern   Not on file  Social History Narrative   Lives at home with husband and son   Right handed   Caffeine: 1 cup/day (coffee or tea)   Pt not working    Social Drivers of Corporate investment banker Strain: Not on file  Food Insecurity: Low Risk  (07/03/2023)   Received from [facility]   Hunger Vital Sign    Within the past 12 months, you worried that your food would run out before you got money to buy more: Never true    Within the past 12 months, the food you bought just didn't last and you didn't have money to get more. : Never true  Transportation Needs: No Transportation Needs (07/03/2023)   Received from [facility]    In the past 12 months, has lack of reliable transportation kept you from medical appointments, meetings, work or from getting things needed for daily living? : No  Physical Activity: Not on file  Stress: Not on file  Social Connections: Not on file  Intimate Partner Violence: Not on file      PHYSICAL EXAM  Vitals:   11/07/23 0906  BP: (!) 124/90  Pulse: 86  SpO2: 98%  Weight: 184 lb 3.2 oz (83.6 kg)  Height: 5' 6 (1.676 m)        Body mass index is 29.73 kg/m.  Generalized: Well developed, in no acute distress  Cardiology: normal rate and rhythm, no murmur noted Respiratory: clear to auscultation bilaterally  Neurological examination  Mentation: Alert oriented to time, place, history taking. Follows  all commands speech and language fluent Cranial nerve II-XII: Pupils were equal round reactive to light. Extraocular movements were full, visual field were full on confrontational test. Facial sensation and strength were normal. Head turning and shoulder shrug  were normal and symmetric. Motor: The motor testing reveals 5 over 5 strength of all 4 extremities. Good symmetric motor tone is noted throughout.  Gait and station: Gait is normal.    DIAGNOSTIC DATA (LABS, IMAGING, TESTING) - I reviewed patient records, labs, notes, testing and imaging myself where available.      No data to display           Lab Results  Component Value Date   WBC 6.0 05/01/2023   HGB 14.1 05/01/2023   HCT 42.3 05/01/2023   MCV 93.8 05/01/2023   PLT 279 05/01/2023      Component Value Date/Time   NA 138 05/01/2023 1402   K 3.8 05/01/2023 1402   CL 108 05/01/2023 1402   CO2 23 05/01/2023 1402   GLUCOSE 89  05/01/2023 1402   BUN 14 05/01/2023 1402   CREATININE 0.91 05/01/2023 1402   CALCIUM 9.0 05/01/2023 1402   PROT 7.0 03/14/2022 0030   ALBUMIN 4.2 03/14/2022 0030   AST 14 (L) 03/14/2022 0030   ALT 14 03/14/2022 0030   ALKPHOS 45 03/14/2022 0030   BILITOT 0.7 03/14/2022 0030   GFRNONAA >60 05/01/2023 1402   GFRAA >60 04/22/2019 1213   No results found for: CHOL, HDL, LDLCALC, LDLDIRECT, TRIG, CHOLHDL No results found for: YHAJ8R No results found for: VITAMINB12 Lab Results  Component Value Date   TSH 0.241 (L) 05/19/2021    ASSESSMENT AND PLAN 43 y.o. year old female  has a past medical history of Asthma, Cancer (HCC), Common migraine with intractable migraine (05/11/2016), Hypertension, IIH (idiopathic intracranial hypertension), Migraines, PCOS (polycystic ovarian syndrome), Reactive airway disease, and Seizures (HCC). here with     ICD-10-CM   1. Migraine without aura and without status migrainosus, not intractable  G43.009 ketorolac  (TORADOL ) 30 MG/ML injection 30 mg       Dynasia reports headaches were previously well managed prior to URI. Recent eye exam did not show concerns of papilledema. We will continue Qulipta , acetazolimide 1000mg , topiramate  100mg  and gabapentin  200mg  daily. She will use Tylenol  as needed for abortive therapy. I will have give her Toradol  in the office and provided samples of Ubrelvy and Nurtec to use for abortive therapy at home. Appropriate dosing discussed. I have encouraged her to follow up closely with her PCP if viral symptoms continue. She was encouraged continue annual follow up with ophthalmology. She was advised against pregnancy. She will continue healthy lifestyle habits. She was encouraged to stay well hydrated. She will follow up in 1 year, sooner if needed. She verbalizes understanding and agreement with this plan.    No orders of the defined types were placed in this encounter.     Meds ordered this encounter  Medications   ketorolac  (TORADOL ) 30 MG/ML injection 30 mg      I personally spent a total of 30 minutes in the care of the patient today including preparing to see the patient, getting/reviewing separately obtained history, performing a medically appropriate exam/evaluation, counseling and educating, placing orders, and documenting clinical information in the EHR.   Greig Forbes, FNP-C 11/07/2023, 9:42 AM [facility] [address] [place], [place] [address] [phone]

## 2023-11-07 NOTE — Patient Instructions (Signed)
 Below is our plan:  We will continue current plan. Try Holland for abortive therapy. Please take 1 tablet at onset of headache. May take 1 additional tablet in 2 hours if needed. Do not take more than 2 tablets in 24 hours or more than 10 in a month.   You can also try Nurtec 1 tablet daily as needed. Do not take abortive medications more than 1-2 times per week.   Keep an eye on your BP. Call PCP to let them know if you are not feeling better in the next 24-48 hours.   Please make sure you are staying well hydrated. I recommend 50-60 ounces daily. Well balanced diet and regular exercise encouraged. Consistent sleep schedule with 6-8 hours recommended.   Please continue follow up with care team as directed.   Follow up with me as scheduled 10/2024  You may receive a survey regarding today's visit. I encourage you to leave honest feed back as I do use this information to improve patient care. Thank you for seeing me today!   GENERAL HEADACHE INFORMATION:   Natural supplements: Magnesium  Oxide or Magnesium  Glycinate 500 mg at bed (up to 800 mg daily) Coenzyme Q10 300 mg in AM Vitamin B2- 200 mg twice a day   Add 1 supplement at a time since even natural supplements can have undesirable side effects. You can sometimes buy supplements cheaper (especially Coenzyme Q10) at www.WebmailGuide.co.za or at [facility].  Migraine with aura: There is increased risk for stroke in women with migraine with aura and a contraindication for the combined contraceptive pill for use by women who have migraine with aura. The risk for women with migraine without aura is lower. However other risk factors like smoking are far more likely to increase stroke risk than migraine. There is a recommendation for no smoking and for the use of OCPs without estrogen such as progestogen only pills particularly for women with migraine with aura.SABRA People who have migraine headaches with auras may be 3 times more likely to have a stroke caused  by a blood clot, compared to migraine patients who don't see auras. Women who take hormone-replacement therapy may be 30 percent more likely to suffer a clot-based stroke than women not taking medication containing estrogen. Other risk factors like smoking and high blood pressure may be  much more important.    Vitamins and herbs that show potential:   Magnesium : Magnesium  (250 mg twice a day or 500 mg at bed) has a relaxant effect on smooth muscles such as blood vessels. Individuals suffering from frequent or daily headache usually have low magnesium  levels which can be increase with daily supplementation of 400-750 mg. Three trials found 40-90% average headache reduction  when used as a preventative. Magnesium  may help with headaches are aura, the best evidence for magnesium  is for migraine with aura is its thought to stop the cortical spreading depression we believe is the pathophysiology of migraine aura.Magnesium  also demonstrated the benefit in menstrually related migraine.  Magnesium  is part of the messenger system in the serotonin cascade and it is a good muscle relaxant.  It is also useful for constipation which can be a side effect of other medications used to treat migraine. Good sources include nuts, whole grains, and tomatoes. Side Effects: loose stool/diarrhea  Riboflavin (vitamin B 2) 200 mg twice a day. This vitamin assists nerve cells in the production of ATP a principal energy storing molecule.  It is necessary for many chemical reactions in the body.  There have been at least 3 clinical trials of riboflavin using 400 mg per day all of which suggested that migraine frequency can be decreased.  All 3 trials showed significant improvement in over half of migraine sufferers.  The supplement is found in bread, cereal, milk, meat, and poultry.  Most Americans get more riboflavin than the recommended daily allowance, however riboflavin deficiency is not necessary for the supplements to help prevent  headache. Side effects: energizing, green urine   Coenzyme Q10: This is present in almost all cells in the body and is critical component for the conversion of energy.  Recent studies have shown that a nutritional supplement of CoQ10 can reduce the frequency of migraine attacks by improving the energy production of cells as with riboflavin.  Doses of 150 mg twice a day have been shown to be effective.   Melatonin: Increasing evidence shows correlation between melatonin secretion and headache conditions.  Melatonin supplementation has decreased headache intensity and duration.  It is widely used as a sleep aid.  Sleep is natures way of dealing with migraine.  A dose of 3 mg is recommended to start for headaches including cluster headache. Higher doses up to 15 mg has been reviewed for use in Cluster headache and have been used. The rationale behind using melatonin for cluster is that many theories regarding the cause of Cluster headache center around the disruption of the normal circadian rhythm in the brain.  This helps restore the normal circadian rhythm.   HEADACHE DIET: Foods and beverages which may trigger migraine Note that only 20% of headache patients are food sensitive. You will know if you are food sensitive if you get a headache consistently 20 minutes to 2 hours after eating a certain food. Only cut out a food if it causes headaches, otherwise you might remove foods you enjoy! What matters most for diet is to eat a well balanced healthy diet full of vegetables and low fat protein, and to not miss meals.   Chocolate, other sweets ALL cheeses except cottage and cream cheese Dairy products, yogurt, sour cream, ice cream Liver Meat extracts (Bovril, Marmite, meat tenderizers) Meats or fish which have undergone aging, fermenting, pickling or smoking. These include: Hotdogs,salami,Lox,sausage, mortadellas,smoked salmon, pepperoni, Pickled herring Pods of broad bean (English beans, Chinese pea  pods, [place] (fava) beans, lima and navy beans Ripe avocado, ripe banana Yeast extracts or active yeast preparations such as Brewer's or Fleishman's (commercial bakes goods are permitted) Tomato based foods, pizza (lasagna, etc.)   MSG (monosodium glutamate) is disguised as many things; look for these common aliases: Monopotassium glutamate Autolysed yeast Hydrolysed protein Sodium caseinate "flavorings" "all natural preservatives Nutrasweet   Avoid all other foods that convincingly provoke headaches.   Resources: The Dizzy Bluford Aid Your Headache Diet, migrainestrong.com  https://zamora-andrews.com/   Caffeine and Migraine For patients that have migraine, caffeine intake more than 3 days per week can lead to dependency and increased migraine frequency. I would recommend cutting back on your caffeine intake as best you can. The recommended amount of caffeine is 200-300 mg daily, although migraine patients may experience dependency at even lower doses. While you may notice an increase in headache temporarily, cutting back will be helpful for headaches in the long run. For more information on caffeine and migraine, visit: https://americanmigrainefoundation.org/resource-library/caffeine-and-migraine/   Headache Prevention Strategies:   1. Maintain a headache diary; learn to identify and avoid triggers.  - This can be a simple note where you log when you had a  headache, associated symptoms, and medications used - There are several smartphone apps developed to help track migraines: Migraine Buddy, Migraine Monitor, Curelator N1-Headache App   Common triggers include: Emotional triggers: Emotional/Upset family or friends Emotional/Upset occupation Business reversal/success Anticipation anxiety Crisis-serious Post-crisis periodNew job/position   Physical triggers: Vacation Day Weekend Strenuous Exercise High Altitude Location New  Move Menstrual Day Physical Illness Oversleep/Not enough sleep Weather changes Light: Photophobia or light sesnitivity treatment involves a balance between desensitization and reduction in overly strong input. Use dark polarized glasses outside, but not inside. Avoid bright or fluorescent light, but do not dim environment to the point that going into a normally lit room hurts. Consider FL-41 tint lenses, which reduce the most irritating wavelengths without blocking too much light.  These can be obtained at axonoptics.com or theraspecs.com Foods: see list above.   2. Limit use of acute treatments (over-the-counter medications, triptans, etc.) to no more than 2 days per week or 10 days per month to prevent medication overuse headache (rebound headache).     3. Follow a regular schedule (including weekends and holidays): Don't skip meals. Eat a balanced diet. 8 hours of sleep nightly. Minimize stress. Exercise 30 minutes per day. Being overweight is associated with a 5 times increased risk of chronic migraine. Keep well hydrated and drink 6-8 glasses of water  per day.   4. Initiate non-pharmacologic measures at the earliest onset of your headache. Rest and quiet environment. Relax and reduce stress. Breathe2Relax is a free app that can instruct you on    some simple relaxtion and breathing techniques. Http://Dawnbuse.com is a    free website that provides teaching videos on relaxation.  Also, there are  many apps that   can be downloaded for "mindful" relaxation.  An app called YOGA NIDRA will help walk you through mindfulness. Another app called Calm can be downloaded to give you a structured mindfulness guide with daily reminders and skill development. Headspace for guided meditation Mindfulness Based Stress Reduction Online Course: www.palousemindfulness.com Cold compresses.   5. Don't wait!! Take the maximum allowable dosage of prescribed medication at the first sign of migraine.   6.  Compliance:  Take prescribed medication regularly as directed and at the first sign of a migraine.   7. Communicate:  Call your physician when problems arise, especially if your headaches change, increase in frequency/severity, or become associated with neurological symptoms (weakness, numbness, slurred speech, etc.). Proceed to emergency room if you experience new or worsening symptoms or symptoms do not resolve, if you have new neurologic symptoms or if headache is severe, or for any concerning symptom.   8. Headache/pain management therapies: Consider various complementary methods, including medication, behavioral therapy, psychological counselling, biofeedback, massage therapy, acupuncture, dry needling, and other modalities.  Such measures may reduce the need for medications. Counseling for pain management, where patients learn to function and ignore/minimize their pain, seems to work very well.   9. Recommend changing family's attention and focus away from patient's headaches. Instead, emphasize daily activities. If first question of day is 'How are your headaches/Do you have a headache today?', then patient will constantly think about headaches, thus making them worse. Goal is to re-direct attention away from headaches, toward daily activities and other distractions.   10. Helpful Websites: www.AmericanHeadacheSociety.org PatentHood.ch www.headaches.org TightMarket.nl www.achenet.org

## 2023-11-11 ENCOUNTER — Encounter (HOSPITAL_COMMUNITY): Payer: Self-pay | Admitting: Emergency Medicine

## 2023-11-11 ENCOUNTER — Emergency Department (HOSPITAL_COMMUNITY)

## 2023-11-11 ENCOUNTER — Emergency Department (HOSPITAL_COMMUNITY)
Admission: EM | Admit: 2023-11-11 | Discharge: 2023-11-11 | Disposition: A | Discharge status: Home / Self Care | Attending: Emergency Medicine | Admitting: Emergency Medicine

## 2023-11-11 DIAGNOSIS — R079 Chest pain, unspecified: Secondary | ICD-10-CM | POA: Diagnosis not present

## 2023-11-11 DIAGNOSIS — R519 Headache, unspecified: Secondary | ICD-10-CM | POA: Diagnosis present

## 2023-11-11 DIAGNOSIS — R1013 Epigastric pain: Secondary | ICD-10-CM | POA: Diagnosis not present

## 2023-11-11 LAB — COMPREHENSIVE METABOLIC PANEL WITH GFR
ALT: 9 U/L (ref 0–44)
AST: 16 U/L (ref 15–41)
Albumin: 4.5 g/dL (ref 3.5–5.0)
Alkaline Phosphatase: 70 U/L (ref 38–126)
Anion gap: 10 (ref 5–15)
BUN: 16 mg/dL (ref 6–20)
CO2: 20 mmol/L — ABNORMAL LOW (ref 22–32)
Calcium: 9.2 mg/dL (ref 8.9–10.3)
Chloride: 110 mmol/L (ref 98–111)
Creatinine, Ser: 1.07 mg/dL — ABNORMAL HIGH (ref 0.44–1.00)
GFR, Estimated: >60 mL/min (ref >60)
Glucose, Bld: 84 mg/dL (ref 70–99)
Potassium: 3.5 mmol/L (ref 3.5–5.1)
Sodium: 141 mmol/L (ref 135–145)
Total Bilirubin: 0.5 mg/dL (ref 0.0–1.2)
Total Protein: 6.9 g/dL (ref 6.5–8.1)

## 2023-11-11 LAB — CBC
HCT: 45.8 % (ref 36.0–46.0)
Hemoglobin: 14.5 g/dL (ref 12.0–15.0)
MCH: 29.8 pg (ref 26.0–34.0)
MCHC: 31.7 g/dL (ref 30.0–36.0)
MCV: 94.2 fL (ref 80.0–100.0)
Platelets: 277 K/uL (ref 150–400)
RBC: 4.86 MIL/uL (ref 3.87–5.11)
RDW: 13.2 % (ref 11.5–15.5)
WBC: 6.3 K/uL (ref 4.0–10.5)
nRBC: 0 % (ref 0.0–0.2)

## 2023-11-11 LAB — HCG, SERUM, QUALITATIVE: Preg, Serum: NEGATIVE

## 2023-11-11 LAB — TROPONIN T, HIGH SENSITIVITY: Troponin T High Sensitivity: <15 ng/L (ref 0–19)

## 2023-11-11 LAB — LIPASE, BLOOD: Lipase: 44 U/L (ref 11–51)

## 2023-11-11 MED ORDER — SUCRALFATE 1 G PO TABS
1.0000 g | ORAL_TABLET | Freq: Once | ORAL | Status: AC
  Administered 2023-11-11: 1 g via ORAL
  Filled 2023-11-11: qty 1

## 2023-11-11 MED ORDER — METOCLOPRAMIDE HCL 5 MG/ML IJ SOLN
10.0000 mg | Freq: Once | INTRAMUSCULAR | Status: AC
  Administered 2023-11-11: 10 mg via INTRAVENOUS
  Filled 2023-11-11: qty 2

## 2023-11-11 MED ORDER — HYOSCYAMINE SULFATE 0.125 MG SL SUBL
0.2500 mg | SUBLINGUAL_TABLET | Freq: Once | SUBLINGUAL | Status: AC
  Administered 2023-11-11: 0.25 mg via SUBLINGUAL
  Filled 2023-11-11: qty 2

## 2023-11-11 MED ORDER — ALUM & MAG HYDROXIDE-SIMETH 200-200-20 MG/5ML PO SUSP
30.0000 mL | Freq: Once | ORAL | Status: AC
  Administered 2023-11-11: 30 mL via ORAL
  Filled 2023-11-11: qty 30

## 2023-11-11 MED ORDER — DIPHENHYDRAMINE HCL 50 MG/ML IJ SOLN
12.5000 mg | Freq: Once | INTRAMUSCULAR | Status: AC
  Administered 2023-11-11: 12.5 mg via INTRAVENOUS
  Filled 2023-11-11: qty 1

## 2023-11-11 MED ORDER — MORPHINE SULFATE (PF) 4 MG/ML IV SOLN
4.0000 mg | Freq: Once | INTRAVENOUS | Status: AC
  Administered 2023-11-11: 4 mg via INTRAVENOUS
  Filled 2023-11-11: qty 1

## 2023-11-11 NOTE — Discharge Instructions (Signed)
Follow up with your neurologist this week. °

## 2023-11-11 NOTE — ED Triage Notes (Signed)
 Pt arriving with chest pain and SOB. Pt also reports headache x2 weeks. Pt has hx of IIH. Pt states it feels like something is sitting on her chest. O2 sat 100% on room air at this time.

## 2023-11-11 NOTE — ED Provider Notes (Addendum)
 [place] [facility] Provider Note   CSN: [id number] Arrival date & time: 11/11/23  1217     Patient presents with: Chest Pain and Shortness of Breath   [name] is a 43 y.o. female.   43 year old female presents with headache as well as abdominal discomfort radiating to her chest.  Patient does have a history of IIH and has been having a frontal headache for the past 2-1/2 weeks.  Was seen by neurology for this and diagnosed with migraines and has been treated for that.  Has been compliant with her Diamox .  Denies any vision loss.  No ataxia or peripheral weakness.  Headaches have been constant in nature and not associated with fever or photophobia or neck pain.  Also has a secondary complaint of acid reflux type symptoms that start in epigastric region and go to her chest.  Has been taking Protonix  for this with limited relief.  Though symptoms are worse when she eats certain foods.       Prior to Admission medications   Medication Sig Start Date End Date Taking? Authorizing Provider  acetaminophen  (TYLENOL ) 500 MG tablet Take 1 tablet (500 mg total) by mouth every 6 (six) hours as needed. Patient taking differently: Take 500 mg by mouth as needed. 11/14/21   Redwine, Madison A, PA-C  acetaZOLAMIDE  (DIAMOX ) 250 MG tablet Take 4 tablets (1,000 mg total) by mouth daily. 10/15/23   Lomax, Amy, NP  albuterol  (VENTOLIN  HFA) 108 (90 Base) MCG/ACT inhaler Inhale 1-2 puffs into the lungs every 6 (six) hours as needed for wheezing or shortness of breath. 01/02/21   Aberman, Caroline C, PA-C  Atogepant  (QULIPTA ) 60 MG TABS Take 1 tablet (60 mg total) by mouth daily. 10/15/23   Lomax, Amy, NP  cetirizine (ZYRTEC) 10 MG tablet Take 10 mg by mouth daily as needed for allergies or rhinitis.    [provider]  gabapentin  (NEURONTIN ) 100 MG capsule Take 2 capsules (200 mg total) by mouth at bedtime. 10/15/23   Lomax, Amy, NP  guaiFENesin  (MUCINEX ) 600 MG  12 hr tablet Take 1 tablet (600 mg total) by mouth 2 (two) times daily. Patient not taking: Reported on 11/07/2023 10/17/23   Darral Longs, MD  hyoscyamine  (ANASPAZ ) 0.125 MG TBDP disintergrating tablet Take 0.125 mg by mouth every 4 (four) hours as needed for bladder spasms or cramping.    [provider]  levonorgestrel  (MIRENA ) 20 MCG/24HR IUD 1 each by Intrauterine route once.    [provider]  loratadine (CLARITIN) 10 MG tablet Take 10 mg by mouth daily as needed for allergies or rhinitis.    [provider]  ondansetron  (ZOFRAN -ODT) 8 MG disintegrating tablet Take 8 mg by mouth 3 (three) times daily. 03/13/22   [provider]  pantoprazole  (PROTONIX ) 40 MG tablet Take 40 mg by mouth 2 (two) times daily. 03/07/22   [provider]  Potassium Citrate 15 MEQ (1620 MG) TBCR Take 1 tablet by mouth 2 (two) times daily. 03/07/22   [provider]  topiramate  (TOPAMAX ) 100 MG tablet Take 1 tablet (100 mg total) by mouth at bedtime. 10/15/23   Lomax, Amy, NP  valACYclovir (VALTREX) 500 MG tablet Take 500 mg by mouth as needed (as needed for lesions).    [provider]    Allergies: Amoxicillin, Aspirin, Ibuprofen, Adhesive [tape], and Venlafaxine hcl    Review of Systems  All other systems reviewed and are negative.   Updated Vital Signs BP ROLLEN)  152/90   Pulse 72   Temp 98.9 F (37.2 C) (Oral)   Resp 19   LMP 10/29/2023 (Approximate)   SpO2 100%   Physical Exam Vitals and nursing note reviewed.  Constitutional:      General: She is not in acute distress.    Appearance: Normal appearance. She is well-developed. She is not toxic-appearing.  HENT:     Head: Normocephalic and atraumatic.  Eyes:     General: Lids are normal.     Conjunctiva/sclera: Conjunctivae normal.     Pupils: Pupils are equal, round, and reactive to light.     Funduscopic exam:    Right eye: No papilledema.        Left eye: No papilledema.  Neck:      Thyroid : No thyroid  mass.     Trachea: No tracheal deviation.  Cardiovascular:     Rate and Rhythm: Normal rate and regular rhythm.     Heart sounds: Normal heart sounds. No murmur heard.    No gallop.  Pulmonary:     Effort: Pulmonary effort is normal. No respiratory distress.     Breath sounds: Normal breath sounds. No stridor. No decreased breath sounds, wheezing, rhonchi or rales.  Abdominal:     General: There is no distension.     Palpations: Abdomen is soft.     Tenderness: There is no abdominal tenderness. There is no rebound.  Musculoskeletal:        General: No tenderness. Normal range of motion.     Cervical back: Normal range of motion and neck supple.  Skin:    General: Skin is warm and dry.     Findings: No abrasion or rash.  Neurological:     General: No focal deficit present.     Mental Status: She is alert and oriented to person, place, and time. Mental status is at baseline.     GCS: GCS eye subscore is 4. GCS verbal subscore is 5. GCS motor subscore is 6.     Cranial Nerves: No cranial nerve deficit.     Sensory: No sensory deficit.     Motor: Motor function is intact.  Psychiatric:        Attention and Perception: Attention normal.        Speech: Speech normal.        Behavior: Behavior normal.     (all labs ordered are listed, but only abnormal results are displayed) Labs Reviewed  CBC  HCG, SERUM, QUALITATIVE  COMPREHENSIVE METABOLIC PANEL WITH GFR  LIPASE, BLOOD  TROPONIN T, HIGH SENSITIVITY    EKG: EKG Interpretation Date/Time:  Sunday November 11 2023 12:27:40 EDT Ventricular Rate:  74 PR Interval:  161 QRS Duration:  113 QT Interval:  367 QTC Calculation: 408 R Axis:   43  Text Interpretation: Sinus rhythm Borderline intraventricular conduction delay Nonspecific T abnormalities, diffuse leads Baseline wander in lead(s) III aVL Confirmed by Dasie Faden ([id number]) on 11/11/2023 1:13:38 PM  Radiology: No results found.   Procedures    Medications Ordered in the ED  metoCLOPramide  (REGLAN ) injection 10 mg (has no administration in time range)  diphenhydrAMINE  (BENADRYL ) injection 12.5 mg (has no administration in time range)  morphine  (PF) 4 MG/ML injection 4 mg (has no administration in time range)                                    Medical Decision Making  Amount and/or Complexity of Data Reviewed Labs: ordered. Radiology: ordered.  Risk OTC drugs. Prescription drug management.  Patient treated for migraine here and does feel better.  Patient GERD symptoms and was treated with and feels better.  Labs here are reassuring.  Neurological symptoms remain stable.  Do not feel that she needs to have an LP at this time.  No papilledema present on her physical exam.  Patient's headache likely secondary to her chronic migraines.  Do not feel it is caused by her IIH.no concern for subarachnoid or meningitis.  Patient to be discharged and follow-up with her neurologist    Final diagnoses:  None    ED Discharge Orders     None          Dasie Faden, MD 11/11/23 1831    Dasie Faden, MD 11/11/23 1831    Dasie Faden, MD 11/11/23 [phone]

## 2023-12-04 ENCOUNTER — Telehealth: Payer: Self-pay | Admitting: Pharmacist

## 2023-12-04 ENCOUNTER — Encounter: Payer: Self-pay | Admitting: Family Medicine

## 2023-12-04 DIAGNOSIS — G43009 Migraine without aura, not intractable, without status migrainosus: Secondary | ICD-10-CM

## 2023-12-04 MED ORDER — UBRELVY 100 MG PO TABS: ORAL_TABLET | ORAL | 11 refills | Status: AC

## 2023-12-04 NOTE — Telephone Encounter (Signed)
 Pharmacy Patient Advocate Encounter   Received notification from Patient Pharmacy that prior authorization for Ubrelvy 100MG  tablets is required/requested.   Insurance verification completed.   The patient is insured through [facility].   Per test claim: PA required; PA started via CoverMyMeds. KEY [id number] . Waiting for clinical questions to populate.

## 2023-12-04 NOTE — Telephone Encounter (Signed)
 Clinical questions have been answered and PA submitted. PA currently Pending. Please be advised that most companies allow up to 30 days to make a decision. We will advise when a determination has been made, or follow up in 1 week.   Please reach out to our team, Rx Prior Auth Pool, if you haven't heard back in a week.

## 2023-12-04 NOTE — Telephone Encounter (Signed)
 Pharmacy Patient Advocate Encounter  Received notification from [facility] that Prior Authorization for UBRELVY 100 MG PO TABS has been APPROVED from 12/04/2023 to 12/03/2024   PA #/Case ID/Reference #: [id number]

## 2024-01-09 ENCOUNTER — Emergency Department (HOSPITAL_BASED_OUTPATIENT_CLINIC_OR_DEPARTMENT_OTHER)

## 2024-01-09 ENCOUNTER — Emergency Department (HOSPITAL_BASED_OUTPATIENT_CLINIC_OR_DEPARTMENT_OTHER)
Admission: EM | Admit: 2024-01-09 | Discharge: 2024-01-09 | Disposition: A | Discharge status: Home / Self Care | Source: Ambulatory Visit | Attending: Emergency Medicine | Admitting: Emergency Medicine

## 2024-01-09 ENCOUNTER — Encounter (HOSPITAL_BASED_OUTPATIENT_CLINIC_OR_DEPARTMENT_OTHER): Payer: Self-pay | Admitting: Emergency Medicine

## 2024-01-09 ENCOUNTER — Other Ambulatory Visit: Payer: Self-pay

## 2024-01-09 DIAGNOSIS — N3000 Acute cystitis without hematuria: Secondary | ICD-10-CM | POA: Insufficient documentation

## 2024-01-09 DIAGNOSIS — R6883 Chills (without fever): Secondary | ICD-10-CM | POA: Diagnosis present

## 2024-01-09 LAB — BASIC METABOLIC PANEL WITH GFR
Anion gap: 10 (ref 5–15)
BUN: 16 mg/dL (ref 6–20)
CO2: 25 mmol/L (ref 22–32)
Calcium: 9.6 mg/dL (ref 8.9–10.3)
Chloride: 106 mmol/L (ref 98–111)
Creatinine, Ser: 1.02 mg/dL — ABNORMAL HIGH (ref 0.44–1.00)
GFR, Estimated: >60 mL/min (ref >60)
Glucose, Bld: 99 mg/dL (ref 70–99)
Potassium: 3.6 mmol/L (ref 3.5–5.1)
Sodium: 141 mmol/L (ref 135–145)

## 2024-01-09 LAB — PREGNANCY, URINE: Preg Test, Ur: NEGATIVE

## 2024-01-09 LAB — CBC
HCT: 41.3 % (ref 36.0–46.0)
Hemoglobin: 13.6 g/dL (ref 12.0–15.0)
MCH: 30.7 pg (ref 26.0–34.0)
MCHC: 32.9 g/dL (ref 30.0–36.0)
MCV: 93.2 fL (ref 80.0–100.0)
Platelets: 276 K/uL (ref 150–400)
RBC: 4.43 MIL/uL (ref 3.87–5.11)
RDW: 13.7 % (ref 11.5–15.5)
WBC: 9.4 K/uL (ref 4.0–10.5)
nRBC: 0 % (ref 0.0–0.2)

## 2024-01-09 LAB — URINALYSIS, ROUTINE W REFLEX MICROSCOPIC
Bacteria, UA: NONE SEEN
Bilirubin Urine: NEGATIVE
Glucose, UA: NEGATIVE mg/dL
Ketones, ur: NEGATIVE mg/dL
Nitrite: NEGATIVE
Protein, ur: 30 mg/dL — AB
RBC / HPF: >50 RBC/hpf (ref 0–5)
Specific Gravity, Urine: 1.019 (ref 1.005–1.030)
WBC, UA: >50 WBC/hpf (ref 0–5)
pH: 6.5 (ref 5.0–8.0)

## 2024-01-09 MED ORDER — ONDANSETRON 4 MG PO TBDP: 4.0000 mg | ORAL_TABLET | Freq: Three times a day (TID) | ORAL | 0 refills | Status: AC | PRN

## 2024-01-09 MED ORDER — CIPROFLOXACIN HCL 500 MG PO TABS
500.0000 mg | ORAL_TABLET | Freq: Once | ORAL | Status: AC
  Administered 2024-01-09: 500 mg via ORAL
  Filled 2024-01-09: qty 1

## 2024-01-09 MED ORDER — CIPROFLOXACIN HCL 500 MG PO TABS: 500.0000 mg | ORAL_TABLET | Freq: Two times a day (BID) | ORAL | 0 refills | Status: AC

## 2024-01-09 NOTE — ED Triage Notes (Signed)
 Pt via POV c/o hematuria with bladder pressure and urinary frequency since last night, sent by UC for eval for possible kidney infection. Bladder pressure rated 8/10 while urinating.

## 2024-01-09 NOTE — Discharge Instructions (Signed)
 Please read and follow all provided instructions.  Your diagnoses today include:  1. Acute cystitis without hematuria    Tests performed today include: Complete blood cell count: Normal red and white blood cell counts Basic metabolic panel: No concerning findings Urinalysis (urine test): Suggests urinary tract infection Pregnancy test (urine or blood, in women only): Appears normal Vital signs. See below for your results today.   Medications prescribed:  Ciprofloxacin - antibiotic  You have been prescribed an antibiotic medicine: take the entire course of medicine even if you are feeling better. Stopping early can cause the antibiotic not to work.  Zofran  (ondansetron ) - for nausea and vomiting  Take any prescribed medications only as directed.  Home care instructions:  Follow any educational materials contained in this packet.  BE VERY CAREFUL not to take multiple medicines containing Tylenol  (also called acetaminophen ). Doing so can lead to an overdose which can damage your liver and cause liver failure and possibly death.   Follow-up instructions: Please follow-up with your primary care provider in the next 3 days for further evaluation of your symptoms if not improving.  Return instructions:  Please return to the Emergency Department if you experience worsening symptoms.  Please return with uncontrolled pain, persistent vomiting, high fevers. Please return if you have any other emergent concerns.  Additional Information:  Your vital signs today were: BP (!) 162/113 (BP Location: Left Arm)   Pulse 71   Temp (!) 97.5 F (36.4 C)   Resp 16   Ht 5' 6 (1.676 m)   Wt 87.5 kg   LMP 12/26/2023 (Approximate)   SpO2 100%   BMI 31.15 kg/m  If your blood pressure (BP) was elevated above 135/85 this visit, please have this repeated by your doctor within one month. --------------

## 2024-01-09 NOTE — ED Provider Notes (Signed)
 [place] EMERGENCY DEPARTMENT AT [facility] Provider Note   CSN: [id number] Arrival date & time: 01/09/24  1911     Patient presents with: Hematuria   [name] is a 43 y.o. female.   Patient with history of kidney stones, migraines, PCOS, hypertension, IIH -- presents to the emergency department today for evaluation of chills, hematuria, back pain.  Patient stated that she noted some blood on the toilet paper yesterday after she urinated.  She noted increased amount of blood today as well as increased urinary frequency.  No documented fevers.  States increasing right back pain and tenderness.  She has not had vomiting.  Seen at outpatient office prior to arrival today.       Prior to Admission medications   Medication Sig Start Date End Date Taking? Authorizing Provider  acetaminophen  (TYLENOL ) 500 MG tablet Take 1 tablet (500 mg total) by mouth every 6 (six) hours as needed. Patient taking differently: Take 500 mg by mouth as needed. 11/14/21   Redwine, Madison A, PA-C  acetaZOLAMIDE  (DIAMOX ) 250 MG tablet Take 4 tablets (1,000 mg total) by mouth daily. 10/15/23   Lomax, Amy, NP  albuterol  (VENTOLIN  HFA) 108 (90 Base) MCG/ACT inhaler Inhale 1-2 puffs into the lungs every 6 (six) hours as needed for wheezing or shortness of breath. 01/02/21   Aberman, Caroline C, PA-C  Atogepant  (QULIPTA ) 60 MG TABS Take 1 tablet (60 mg total) by mouth daily. 10/15/23   Lomax, Amy, NP  cetirizine (ZYRTEC) 10 MG tablet Take 10 mg by mouth daily as needed for allergies or rhinitis.    [provider]  gabapentin  (NEURONTIN ) 100 MG capsule Take 2 capsules (200 mg total) by mouth at bedtime. 10/15/23   Lomax, Amy, NP  guaiFENesin  (MUCINEX ) 600 MG 12 hr tablet Take 1 tablet (600 mg total) by mouth 2 (two) times daily. Patient not taking: Reported on 11/07/2023 10/17/23   Darral Longs, MD  hyoscyamine  (ANASPAZ ) 0.125 MG TBDP disintergrating tablet Take 0.125 mg by mouth every 4 (four)  hours as needed for bladder spasms or cramping.    [provider]  levonorgestrel  (MIRENA ) 20 MCG/24HR IUD 1 each by Intrauterine route once.    [provider]  loratadine (CLARITIN) 10 MG tablet Take 10 mg by mouth daily as needed for allergies or rhinitis.    [provider]  ondansetron  (ZOFRAN -ODT) 8 MG disintegrating tablet Take 8 mg by mouth 3 (three) times daily. 03/13/22   [provider]  pantoprazole  (PROTONIX ) 40 MG tablet Take 40 mg by mouth 2 (two) times daily. 03/07/22   [provider]  Potassium Citrate 15 MEQ (1620 MG) TBCR Take 1 tablet by mouth 2 (two) times daily. 03/07/22   [provider]  topiramate  (TOPAMAX ) 100 MG tablet Take 1 tablet (100 mg total) by mouth at bedtime. 10/15/23   Lomax, Amy, NP  Ubrogepant  (UBRELVY ) 100 MG TABS Take 1 tablet at onset of headache. May take 1 additional tablet in 2 hours if needed. Do not take more than 2 tablets in 24 hours or more than 10 in a month. 12/04/23   Lomax, Amy, NP  valACYclovir (VALTREX) 500 MG tablet Take 500 mg by mouth as needed (as needed for lesions).    [provider]    Allergies: Amoxicillin, Aspirin, Ibuprofen, Adhesive [tape], and Venlafaxine hcl    Review of Systems  Updated Vital Signs BP (!) 162/113 (BP Location: Left Arm)   Pulse 71   Temp (!) 97.5  F (36.4 C)   Resp 16   Ht 5' 6 (1.676 m)   Wt 87.5 kg   LMP 12/26/2023 (Approximate)   SpO2 100%   BMI 31.15 kg/m   Physical Exam Vitals and nursing note reviewed.  Constitutional:      General: She is not in acute distress.    Appearance: She is well-developed.  HENT:     Head: Normocephalic and atraumatic.     Right Ear: External ear normal.     Left Ear: External ear normal.     Nose: Nose normal.  Eyes:     Conjunctiva/sclera: Conjunctivae normal.  Cardiovascular:     Rate and Rhythm: Normal rate and regular rhythm.     Heart sounds: No murmur heard. Pulmonary:     Effort: No  respiratory distress.     Breath sounds: No wheezing, rhonchi or rales.  Abdominal:     Palpations: Abdomen is soft.     Tenderness: There is abdominal tenderness (Mild, suprapubic). There is right CVA tenderness. There is no left CVA tenderness, guarding or rebound.  Musculoskeletal:     Cervical back: Normal range of motion and neck supple.     Right lower leg: No edema.     Left lower leg: No edema.  Skin:    General: Skin is warm and dry.     Findings: No rash.  Neurological:     General: No focal deficit present.     Mental Status: She is alert. Mental status is at baseline.     Motor: No weakness.  Psychiatric:        Mood and Affect: Mood normal.     (all labs ordered are listed, but only abnormal results are displayed) Labs Reviewed  URINALYSIS, ROUTINE W REFLEX MICROSCOPIC - Abnormal; Notable for the following components:      Result Value   APPearance HAZY (*)    Hgb urine dipstick LARGE (*)    Protein, ur 30 (*)    Leukocytes,Ua LARGE (*)    All other components within normal limits  BASIC METABOLIC PANEL WITH GFR - Abnormal; Notable for the following components:   Creatinine, Ser 1.02 (*)    All other components within normal limits  PREGNANCY, URINE  CBC    EKG: None  Radiology: CT Renal Stone Study Result Date: 01/09/2024 EXAM: CT UROGRAM 01/09/2024 08:00:21 PM TECHNIQUE: CT of the abdomen and pelvis was performed without the administration of intravenous contrast as per CT urogram protocol. Multiplanar reformatted images as well as MIP urogram images are provided for review. Automated exposure control, iterative reconstruction, and/or weight based adjustment of the mA/kV was utilized to reduce the radiation dose to as low as reasonably achievable. COMPARISON: 03/06/2022 CLINICAL HISTORY: Abdominal/flank pain, stone suspected. FINDINGS: LOWER CHEST: No acute abnormality. LIVER: The liver is unremarkable. GALLBLADDER AND BILE DUCTS: Gallbladder is unremarkable.  No biliary ductal dilatation. SPLEEN: No acute abnormality. PANCREAS: No acute abnormality. ADRENAL GLANDS: No acute abnormality. KIDNEYS, URETERS AND BLADDER: Punctate nonobstructing bilateral nephrolithiasis. No stones in the ureters. No hydronephrosis. No perinephric or periureteral stranding. Urinary bladder is unremarkable. GI AND BOWEL: Stomach demonstrates no acute abnormality. Normal appendix. There is no bowel obstruction. PERITONEUM AND RETROPERITONEUM: No ascites. No free air. VASCULATURE: Aorta is normal in caliber. LYMPH NODES: No lymphadenopathy. REPRODUCTIVE ORGANS: IUD in the uterus. Increased size of a mildly hyperdense oval mass in the left anterior pelvis now measuring 5.3 x 3.9 cm, previously 2.5 x 2.0 cm on 03/06/2022. This  is favored to represent a degenerating exophytic fibroid from the uterine fundus. Ill-defined cervix / lower uterine segment similar to prior. BONES AND SOFT TISSUES: No acute osseous abnormality. No focal soft tissue abnormality. IMPRESSION: 1. Increased size of a mildly hyperdense oval mass in the left anterior pelvis, now 5.3 cm (previously 2.5 on 03/06/2022), favored to represent a degenerating exophytic uterine fibroid. Consider further evaluation with pelvic ultrasound. 2. Punctate nonobstructing bilateral nephrolithiasis. Electronically signed by: Norman Gatlin MD 01/09/2024 08:59 PM EST RP Workstation: [id number]     Procedures   Medications Ordered in the ED  ciprofloxacin (CIPRO) tablet 500 mg (has no administration in time range)    ED Course  Patient seen and examined. History obtained directly from patient. Work-up including labs, imaging, EKG ordered in triage, if performed, were reviewed.    Labs/EKG: Independently reviewed and interpreted.  This included: CBC with normal white blood cell count and hemoglobin; BMP unremarkable; UA with greater than 50 red cells, greater than 50 white cells, clean-catch, white blood cell clumps present; negative  pregnancy  Imaging: Independently visualized and interpreted.  This included: CT renal protocol, intrarenal stones noted, awaiting radiology read.  Medications/Fluids: None ordered  Most recent vital signs reviewed and are as follows: BP (!) 162/113 (BP Location: Left Arm)   Pulse 71   Temp (!) 97.5 F (36.4 C)   Resp 16   Ht 5' 6 (1.676 m)   Wt 87.5 kg   LMP 12/26/2023 (Approximate)   SpO2 100%   BMI 31.15 kg/m   Initial impression: Likely UTI, cannot rule out early pyelonephritis.  9:22 PM Reassessment performed. Patient appears stable, no worsening.  Imaging personally visualized and interpreted including: CT of the abdomen pelvis, agree no acute findings, renal stones without signs of obstructive stone.  Reviewed pertinent lab work and imaging with patient at bedside. Questions answered.   Most current vital signs reviewed and are as follows: BP (!) 162/113 (BP Location: Left Arm)   Pulse 71   Temp (!) 97.5 F (36.4 C)   Resp 16   Ht 5' 6 (1.676 m)   Wt 87.5 kg   LMP 12/26/2023 (Approximate)   SpO2 100%   BMI 31.15 kg/m   Plan: Discharge to home.   Prescriptions written for: Cipro, Zofran   Other home care instructions discussed: OTC meds for pain control, maintain good hydration  ED return instructions discussed: The patient was urged to return to the Emergency Department immediately with worsening of current symptoms, worsening abdominal pain, persistent vomiting, blood noted in stools, fever, or any other concerns. The patient verbalized understanding.   Follow-up instructions discussed: Patient encouraged to follow-up with their PCP in 3 days if not starting to feel better.                                   Medical Decision Making Amount and/or Complexity of Data Reviewed Labs: ordered. Radiology: ordered.  Risk Prescription drug management.   Patient with some irritative UTI symptoms and hematuria.  She is developing some back pain.  UA  suggestive of UTI and bleeding.  CT without signs of obstructive ureteral stone or other cause of the patient's symptoms.  Will treat with Cipro to cover for pyelonephritis, although symptoms are fairly mild at this time.  She looks well, nontoxic.  Remainder of lab work is reassuring.  For this patient's complaint of abdominal pain, the following conditions were  considered on the differential diagnosis: gastritis/PUD, enteritis/duodenitis, appendicitis, cholelithiasis/cholecystitis, cholangitis, pancreatitis, ruptured viscus, colitis, diverticulitis, small/large bowel obstruction, proctitis, cystitis, pyelonephritis, ureteral colic, aortic dissection, aortic aneurysm. In women, ectopic pregnancy, pelvic inflammatory disease, ovarian cysts, and tubo-ovarian abscess were also considered. Atypical chest etiologies were also considered including ACS, PE, and pneumonia.  The patient's vital signs, pertinent lab work and imaging were reviewed and interpreted as discussed in the ED course. Hospitalization was considered for further testing, treatments, or serial exams/observation. However as patient is well-appearing, has a stable exam, and reassuring studies today, I do not feel that they warrant admission at this time. This plan was discussed with the patient who verbalizes agreement and comfort with this plan and seems reliable and able to return to the Emergency Department with worsening or changing symptoms.       Final diagnoses:  Acute cystitis without hematuria    ED Discharge Orders          Ordered    ciprofloxacin (CIPRO) 500 MG tablet  Every 12 hours        01/09/24 2120    ondansetron  (ZOFRAN -ODT) 4 MG disintegrating tablet  Every 8 hours PRN        01/09/24 2120               Desiderio Chew, PA-C 01/09/24 2124    Towana Ozell BROCKS, MD 01/10/24 1020

## 2024-02-05 ENCOUNTER — Other Ambulatory Visit: Payer: Self-pay | Admitting: Nurse Practitioner

## 2024-02-05 DIAGNOSIS — D259 Leiomyoma of uterus, unspecified: Secondary | ICD-10-CM

## 2024-03-05 ENCOUNTER — Emergency Department (HOSPITAL_BASED_OUTPATIENT_CLINIC_OR_DEPARTMENT_OTHER)

## 2024-03-05 ENCOUNTER — Other Ambulatory Visit: Payer: Self-pay

## 2024-03-05 ENCOUNTER — Encounter (HOSPITAL_BASED_OUTPATIENT_CLINIC_OR_DEPARTMENT_OTHER): Payer: Self-pay | Admitting: Emergency Medicine

## 2024-03-05 ENCOUNTER — Emergency Department (HOSPITAL_BASED_OUTPATIENT_CLINIC_OR_DEPARTMENT_OTHER)
Admission: EM | Admit: 2024-03-05 | Discharge: 2024-03-05 | Disposition: A | Discharge status: Home / Self Care | Source: Ambulatory Visit | Attending: Emergency Medicine | Admitting: Emergency Medicine

## 2024-03-05 DIAGNOSIS — K6289 Other specified diseases of anus and rectum: Secondary | ICD-10-CM | POA: Diagnosis present

## 2024-03-05 DIAGNOSIS — K625 Hemorrhage of anus and rectum: Secondary | ICD-10-CM | POA: Insufficient documentation

## 2024-03-05 LAB — COMPREHENSIVE METABOLIC PANEL WITH GFR
ALT: 10 U/L (ref 0–44)
AST: 16 U/L (ref 15–41)
Albumin: 4.7 g/dL (ref 3.5–5.0)
Alkaline Phosphatase: 69 U/L (ref 38–126)
Anion gap: 12 (ref 5–15)
BUN: 17 mg/dL (ref 6–20)
CO2: 21 mmol/L — ABNORMAL LOW (ref 22–32)
Calcium: 9.2 mg/dL (ref 8.9–10.3)
Chloride: 107 mmol/L (ref 98–111)
Creatinine, Ser: 1.04 mg/dL — ABNORMAL HIGH (ref 0.44–1.00)
GFR, Estimated: >60 mL/min
Glucose, Bld: 86 mg/dL (ref 70–99)
Potassium: 3.6 mmol/L (ref 3.5–5.1)
Sodium: 141 mmol/L (ref 135–145)
Total Bilirubin: 0.4 mg/dL (ref 0.0–1.2)
Total Protein: 7.2 g/dL (ref 6.5–8.1)

## 2024-03-05 LAB — CBC
HCT: 43.6 % (ref 36.0–46.0)
Hemoglobin: 14.2 g/dL (ref 12.0–15.0)
MCH: 30.7 pg (ref 26.0–34.0)
MCHC: 32.6 g/dL (ref 30.0–36.0)
MCV: 94.2 fL (ref 80.0–100.0)
Platelets: 261 10*3/uL (ref 150–400)
RBC: 4.63 MIL/uL (ref 3.87–5.11)
RDW: 13.3 % (ref 11.5–15.5)
WBC: 6.4 10*3/uL (ref 4.0–10.5)
nRBC: 0 % (ref 0.0–0.2)

## 2024-03-05 LAB — PREGNANCY, URINE: Preg Test, Ur: NEGATIVE

## 2024-03-05 MED ORDER — HYDROCORTISONE (PERIANAL) 2.5 % EX CREA: 1.0000 | TOPICAL_CREAM | Freq: Two times a day (BID) | CUTANEOUS | 0 refills | Status: AC

## 2024-03-05 MED ORDER — POLYETHYLENE GLYCOL 3350 17 GM/SCOOP PO POWD: 17.0000 g | Freq: Every day | ORAL | 0 refills | Status: AC

## 2024-03-05 MED ORDER — ACETAMINOPHEN 500 MG PO TABS
1000.0000 mg | ORAL_TABLET | Freq: Once | ORAL | Status: AC
  Administered 2024-03-05: 1000 mg via ORAL
  Filled 2024-03-05: qty 2

## 2024-03-05 MED ORDER — IOHEXOL 300 MG/ML  SOLN
100.0000 mL | Freq: Once | INTRAMUSCULAR | Status: AC | PRN
  Administered 2024-03-05: 100 mL via INTRAVENOUS

## 2024-03-05 NOTE — ED Triage Notes (Signed)
 Pt via pov from pcp with rectal pain x 4 days when sitting or defecating. Pt states she did see some blood on toilet paper today. Pt reports that she has been having BMs daily, but that it hurts, and she does not have to strain. Pt denies n/v. Pt states pcp was unable to do rectal exam. A&O x 4; nad noted.

## 2024-03-05 NOTE — Discharge Instructions (Addendum)
 CT pelvis with IV contrast showed no perianal or perirectal abscess and no acute intrapelvic pathology. Given negative imaging, absence of external signs of infection, and stable vital signs, symptoms are most consistent with localized anorectal irritation, such as hemorrhoidal inflammation or anal fissure. No indication for antibiotics or surgical intervention at this time. Patient improved with symptomatic management and is safe for discharge.  Final Diagnoses Rectal pain Bright red blood per rectum  Discharge Instructions  Your CT scan today did not show an abscess or infection. Your symptoms are most consistent with irritation or inflammation of the anal area, such as hemorrhoids or a small fissure.  Medications  - Anusol  (hydrocortisone ) rectal cream: Apply a small amount to the anal area twice daily for pain and irritation.  - Polyethylene glycol (PEG / MiraLAX ): Take 17 grams (one capful) mixed in 8 ounces of water  once daily to keep stools soft and prevent straining.  - Acetaminophen  as needed for pain (do not exceed 3,000 mg per day).  Home Care Take warm sitz baths or soak in warm water  for 10-15 minutes, 2-3 times daily and after bowel movements. Avoid straining or prolonged sitting on the toilet. Drink plenty of fluids and maintain a fiber-rich diet.  Follow-Up  Follow up with your primary care provider within 2-3 days. Consider GI or colorectal surgery follow-up if pain or bleeding persists.  Return to the ED if you develop:  Fever Worsening or spreading rectal pain New redness, swelling, or drainage Inability to have bowel movements or urinate Heavy or ongoing rectal bleeding

## 2024-03-05 NOTE — ED Provider Notes (Addendum)
 " [facility] AT [facility] Provider Note   CSN: [id number] Arrival date & time: 03/05/24  1440     Patient presents with: Rectal Pain   [name] is a 44 y.o. female with a history of IBS-C and prior hemorrhoids who presents via POV from her PCP for 4 days of progressively worsening rectal/perianal pain. Pain is constant, most severe with sitting and defecation, and significantly limits tolerance of examination. She reports daily bowel movements without straining, though stools have been somewhat harder than baseline, and passage is painful. She noticed small amounts of bright red blood on toilet paper yesterday and today. She denies abdominal pain, nausea, vomiting, diarrhea, or melena.  She denies fever but reports chills earlier today. She was evaluated by her PCP earlier today, where a digital rectal exam could not be completed due to pain, and she was referred to the ED for further evaluation and imaging. She denies prior anorectal surgery. Last colonoscopy was in 2023. Family history is notable for father recently diagnosed with colon cancer.   HPI     Prior to Admission medications  Medication Sig Start Date End Date Taking? Authorizing Provider  hydrocortisone  (ANUSOL -HC) 2.5 % rectal cream Place 1 Application rectally 2 (two) times daily. 03/05/24  Yes Ferdinando Lodge, MD  polyethylene glycol powder (GLYCOLAX /MIRALAX ) 17 GM/SCOOP powder Take 17 g by mouth daily. Dissolve 1 capful (17g) in 4-8 ounces of liquid and take by mouth daily. 03/05/24  Yes Naesha Buckalew, MD  acetaminophen  (TYLENOL ) 500 MG tablet Take 1 tablet (500 mg total) by mouth every 6 (six) hours as needed. Patient taking differently: Take 500 mg by mouth as needed. 11/14/21   Redwine, Madison A, PA-C  acetaZOLAMIDE  (DIAMOX ) 250 MG tablet Take 4 tablets (1,000 mg total) by mouth daily. 10/15/23   Lomax, Amy, NP  albuterol  (VENTOLIN  HFA) 108 (90 Base) MCG/ACT inhaler Inhale 1-2 puffs  into the lungs every 6 (six) hours as needed for wheezing or shortness of breath. 01/02/21   Aberman, Caroline C, PA-C  Atogepant  (QULIPTA ) 60 MG TABS Take 1 tablet (60 mg total) by mouth daily. 10/15/23   Lomax, Amy, NP  cetirizine (ZYRTEC) 10 MG tablet Take 10 mg by mouth daily as needed for allergies or rhinitis.    [provider]  ciprofloxacin  (CIPRO ) 500 MG tablet Take 1 tablet (500 mg total) by mouth every 12 (twelve) hours. 01/09/24   Geiple, Joshua, PA-C  gabapentin  (NEURONTIN ) 100 MG capsule Take 2 capsules (200 mg total) by mouth at bedtime. 10/15/23   Lomax, Amy, NP  guaiFENesin  (MUCINEX ) 600 MG 12 hr tablet Take 1 tablet (600 mg total) by mouth 2 (two) times daily. Patient not taking: Reported on 11/07/2023 10/17/23   Darral Longs, MD  hyoscyamine  (ANASPAZ ) 0.125 MG TBDP disintergrating tablet Take 0.125 mg by mouth every 4 (four) hours as needed for bladder spasms or cramping.    [provider]  levonorgestrel  (MIRENA ) 20 MCG/24HR IUD 1 each by Intrauterine route once.    [provider]  loratadine (CLARITIN) 10 MG tablet Take 10 mg by mouth daily as needed for allergies or rhinitis.    [provider]  ondansetron  (ZOFRAN -ODT) 4 MG disintegrating tablet Take 1 tablet (4 mg total) by mouth every 8 (eight) hours as needed for nausea or vomiting. 01/09/24   Geiple, Joshua, PA-C  pantoprazole  (PROTONIX ) 40 MG tablet Take 40 mg by mouth 2 (two) times daily. 03/07/22   [provider]  Potassium Citrate  15 MEQ (1620 MG) TBCR Take 1 tablet by mouth 2 (two) times daily. 03/07/22   [provider]  topiramate  (TOPAMAX ) 100 MG tablet Take 1 tablet (100 mg total) by mouth at bedtime. 10/15/23   Lomax, Amy, NP  Ubrogepant  (UBRELVY ) 100 MG TABS Take 1 tablet at onset of headache. May take 1 additional tablet in 2 hours if needed. Do not take more than 2 tablets in 24 hours or more than 10 in a month. 12/04/23   Lomax, Amy, NP  valACYclovir (VALTREX) 500  MG tablet Take 500 mg by mouth as needed (as needed for lesions).    [provider]    Allergies: Amoxicillin, Aspirin, Ibuprofen, Adhesive [tape], and Venlafaxine hcl    Review of Systems Constitutional: Denies fever; reports chills today. Gastrointestinal: Positive for rectal/perianal pain and minimal bright red blood per rectum. Denies abdominal pain, nausea, vomiting, diarrhea, melena. Genitourinary: Denies dysuria or hematuria. Skin: Denies drainage or skin changes. Neurologic: Denies weakness or numbness. All other systems reviewed and negative. Updated Vital Signs BP (!) 165/94 (BP Location: Right Arm)   Pulse 67   Temp 98.5 F (36.9 C) (Oral)   Resp 14   Ht 5' 6 (1.676 m)   Wt 87.5 kg   LMP 02/27/2024 (Approximate)   SpO2 100%   BMI 31.14 kg/m   Physical Exam General: Awake, alert, uncomfortable appearing due to pain with sitting, nontoxic. HEENT: Normocephalic, atraumatic. Cardiovascular: Regular rate and rhythm; well perfused. Pulmonary: Normal work of breathing; clear to auscultation. Abdomen: Soft, nondistended, nontender, no guarding or rebound. GU/Rectal (with chaperone): External perianal inspection performed. No rectal prolapse, no visible fissure or laceration, no erythema, no drainage, and no external thrombosed hemorrhoid. Marked focal tenderness, greatest over the left perianal region. No palpable fluctuance or mass appreciated on external exam; however, examination is limited by significant pain, and deep palpation not tolerated, therefore a deep perirectal abscess cannot be excluded. Digital rectal exam deferred due to pain. Skin: Warm, dry, intact. Neurologic: Alert and oriented 4, no focal deficits. Psychiatric: Appropriate affect and behavior. (all labs ordered are listed, but only abnormal results are displayed) Labs Reviewed  COMPREHENSIVE METABOLIC PANEL WITH GFR - Abnormal; Notable for the following components:      Result Value   CO2 21  (*)    Creatinine, Ser 1.04 (*)    All other components within normal limits  CBC  PREGNANCY, URINE  POC OCCULT BLOOD, ED    EKG: None  Radiology: CT PELVIS W CONTRAST Result Date: 03/05/2024 CLINICAL DATA:  Perianal abscess. EXAM: CT PELVIS WITH CONTRAST TECHNIQUE: Multidetector CT imaging of the pelvis was performed using the standard protocol following the bolus administration of intravenous contrast. RADIATION DOSE REDUCTION: This exam was performed according to the departmental dose-optimization program which includes automated exposure control, adjustment of the mA and/or kV according to patient size and/or use of iterative reconstruction technique. CONTRAST:  OMNIPAQUE  IOHEXOL  300 MG/ML  SOLN COMPARISON:  CT dated 01/09/2024. FINDINGS: Urinary Tract:  The urinary bladder is grossly unremarkable. Bowel:  Unremarkable visualized pelvic bowel loops. Vascular/Lymphatic: No pathologically enlarged lymph nodes. No significant vascular abnormality seen. Reproductive: The uterus is anteverted. Probable exophytic uterine fibroid. An intrauterine device is noted. No suspicious adnexal masses. Other:  None Musculoskeletal: No acute osseous pathology. IMPRESSION: No acute intrapelvic pathology. No perianal abscess. Electronically Signed   By: Vanetta Chou M.D.   On: 03/05/2024 20:33     Procedures   Medications Ordered in the  ED  acetaminophen  (TYLENOL ) tablet 1,000 mg (has no administration in time range)  iohexol  (OMNIPAQUE ) 300 MG/ML solution 100 mL (100 mLs Intravenous Contrast Given 03/05/24 2014)    Clinical Course as of 03/05/24 2120  Wed Mar 05, 2024  2118 Comprehensive metabolic panel(!) [MA]  2119 Creatinine(!): 1.04 [MA]    Clinical Course User Index [MA] Bernadine Manos, MD                                 Medical Decision Making Amount and/or Complexity of Data Reviewed Labs: ordered. Radiology: ordered.  Risk Prescription drug management.   44 year old  female with history of IBS-C and prior hemorrhoids presenting with 4 days of severe rectal/perianal pain worsened by sitting and defecation, associated with minimal bright red blood per rectum. On exam, there is marked focal left perianal tenderness without visible fissure, thrombosed hemorrhoid, erythema, drainage, or palpable fluctuance; however, examination is significantly limited by pain, and digital rectal exam was not tolerated, similar to PCP evaluation earlier today.  Given the severity and focal nature of pain, inability to tolerate rectal exam, and presence of chills (without fever), there is concern for a deep perirectal abscess not evident on external inspection. Other considerations include anal fissure, symptomatic hemorrhoids, and proctitis. Lower suspicion for intra-abdominal pathology given absence of abdominal pain or systemic toxicity. Malignancy considered less likely given recent colonoscopy (2023), though family history is noted.  Plan: Laboratory evaluation (CBC, CMP, urine pregnancy) to assess for infection or metabolic derangements. CT pelvis with IV contrast ordered to evaluate for perirectal/perianal abscess given limited exam and high clinical suspicion. Pain control and supportive care provided. Disposition to be determined based on imaging results. If abscess is identified, will consult General Surgery/Colorectal Surgery for further management. If imaging is negative, anticipate symptomatic treatment, bowel regimen, and close outpatient follow-up.  CT pelvis with IV contrast was obtained to evaluate for perianal/perirectal abscess given focal left-sided perianal tenderness and inability to tolerate rectal exam. CT demonstrates no acute intrapelvic pathology and no perianal abscess. No evidence of deep infection.  Given negative imaging and absence of external signs of abscess (no erythema, fluctuance, drainage, or mass), presentation is most consistent with localized  anorectal pain, likely secondary to anal fissure or symptomatic internal hemorrhoidal disease, despite no fissure visualized on limited external exam. Degree of pain likely contributed to exam intolerance. Lower suspicion for infectious process or surgical pathology at this time.  Patient is afebrile, hemodynamically stable, nontoxic, and without leukocytosis or systemic signs of infection. Pain improved with ED management. No indication for antibiotics or surgical intervention at this time.  Shared decision-making performed with patient. Plan for symptomatic management, bowel regimen, and close outpatient follow-up. Strict return precautions discussed.     Final diagnoses:  Rectal bleeding  Anal or rectal pain    ED Discharge Orders          Ordered    hydrocortisone  (ANUSOL -HC) 2.5 % rectal cream  2 times daily        03/05/24 2112    polyethylene glycol powder (GLYCOLAX /MIRALAX ) 17 GM/SCOOP powder  Daily        03/05/24 2112               Ethelbert Thain, MD 03/05/24 2031    Brandt Chaney, MD 03/05/24 2115    Doretha Folks, MD 03/05/24 2306  "

## 2024-04-10 ENCOUNTER — Other Ambulatory Visit

## 2024-10-20 ENCOUNTER — Ambulatory Visit: Admitting: Family Medicine
